# Patient Record
Sex: Male | Born: 1940 | Race: White | Hispanic: No | Marital: Married
Health system: Midwestern US, Community
[De-identification: ages and names within clinical notes are randomized; demographics above are authoritative.]

## PROBLEM LIST (undated history)

## (undated) DIAGNOSIS — Z8673 Personal history of transient ischemic attack (TIA), and cerebral infarction without residual deficits: Secondary | ICD-10-CM

## (undated) DIAGNOSIS — N183 Chronic kidney disease, stage 3 unspecified: Secondary | ICD-10-CM

## (undated) DIAGNOSIS — Z8739 Personal history of other diseases of the musculoskeletal system and connective tissue: Secondary | ICD-10-CM

## (undated) DIAGNOSIS — E039 Hypothyroidism, unspecified: Secondary | ICD-10-CM

## (undated) DIAGNOSIS — M25561 Pain in right knee: Secondary | ICD-10-CM

## (undated) DIAGNOSIS — Z955 Presence of coronary angioplasty implant and graft: Secondary | ICD-10-CM

## (undated) DIAGNOSIS — I10 Essential (primary) hypertension: Secondary | ICD-10-CM

## (undated) DIAGNOSIS — I2510 Atherosclerotic heart disease of native coronary artery without angina pectoris: Secondary | ICD-10-CM

## (undated) DIAGNOSIS — M25562 Pain in left knee: Secondary | ICD-10-CM

## (undated) DIAGNOSIS — E1169 Type 2 diabetes mellitus with other specified complication: Secondary | ICD-10-CM

## (undated) DIAGNOSIS — Z87442 Personal history of urinary calculi: Secondary | ICD-10-CM

## (undated) DIAGNOSIS — E119 Type 2 diabetes mellitus without complications: Secondary | ICD-10-CM

## (undated) DIAGNOSIS — E785 Hyperlipidemia, unspecified: Secondary | ICD-10-CM

## (undated) DIAGNOSIS — N400 Enlarged prostate without lower urinary tract symptoms: Secondary | ICD-10-CM

## (undated) DIAGNOSIS — H9190 Unspecified hearing loss, unspecified ear: Secondary | ICD-10-CM

## (undated) HISTORY — PX: URETEROLITHOTOMY: SHX71

## (undated) HISTORY — DX: Essential (primary) hypertension: I10

## (undated) HISTORY — DX: Type 2 diabetes mellitus with other specified complication: E11.69

## (undated) HISTORY — DX: Atherosclerotic heart disease of native coronary artery without angina pectoris: I25.10

## (undated) HISTORY — PX: EXTRACORPOREAL SHOCK WAVE LITHOTRIPSY: SHX1557

## (undated) HISTORY — DX: Hyperlipidemia, unspecified: E78.5

---

## 1989-10-18 HISTORY — PX: PERCUTANEOUS NEPHROSTOLITHOTOMY: SHX2207

## 2010-10-18 HISTORY — PX: CORONARY ANGIOPLASTY WITH STENT PLACEMENT: SHX49

## 2014-05-10 MED ORDER — ONDANSETRON (PF) 4 MG/2 ML INJECTION
4 mg/2 mL | INTRAMUSCULAR | Status: AC
Start: 2014-05-10 — End: 2014-05-10
  Administered 2014-05-11: via INTRAVENOUS

## 2014-05-10 MED FILL — SODIUM CHLORIDE 0.9 % IV: INTRAVENOUS | Qty: 1000

## 2014-05-10 MED FILL — ONDANSETRON (PF) 4 MG/2 ML INJECTION: 4 mg/2 mL | INTRAMUSCULAR | Qty: 2

## 2014-05-10 MED FILL — BD POSIFLUSH NORMAL SALINE 0.9 % INJECTION SYRINGE: INTRAMUSCULAR | Qty: 10

## 2014-05-10 NOTE — ED Notes (Signed)
Hourly rounding complete. Pt is resting in room with even and unlabored breathing. NAD noted. Bed in low position and call bell within reach. Safety/fall precautions in place. Pt updated on plan of care. Pt has no current needs at this time. Pt reports he is feeling much better after fluids and medication.

## 2014-05-10 NOTE — ED Notes (Signed)
ED provider has reviewed discharge instructions with the patient, spouse and daughter.  The patient, spouse and daughter verbalized understanding. Pt confirmed understanding of need for follow up with primary care provider and to return to emergency room for worsening symptoms.   Pt is not in any current distress and shows no evidence of clinical instability. Pt left ambulatory.  Pt family/friends are present. Pt left with all personal belongings.   Pt states chief complaint has improved with treatment provided  Pt is alert and oriented x 4, Pt is hemodynamically/respiratorily stable.    Paperwork given by provider and reviewed with patient, opportunity for questions/clarification given.

## 2014-05-10 NOTE — ED Notes (Signed)
Pt provided with urinal and asked to provide a sample when able

## 2014-05-10 NOTE — ED Notes (Signed)
Provider at bedside

## 2014-05-10 NOTE — ED Notes (Addendum)
Vomiting that started this am on way [place].  Denies CP or SOB.  Vomiting x 3, food color.  Pt had a headache yesterday.

## 2014-05-10 NOTE — ED Notes (Signed)
Pt and family report that pt has not been feeling well over the last 2 days. Pt vomited approx 3 times today and is nauseous upon arrival. He also reports he has had a headache over the past two weeks.

## 2014-05-10 NOTE — ED Provider Notes (Signed)
Patient is a 73 y.o. male presenting with vomiting. The history is provided by the patient and the spouse.   Vomiting   This is a new problem. The current episode started 6 to 12 hours ago. The problem occurs 2 to 4 times per day. The emesis has an appearance of stomach contents. There has been no fever. Associated symptoms include chills, a fever, abdominal pain, headaches and headaches. Pertinent negatives include no diarrhea and no cough.        Past Medical History   Diagnosis Date   ??? Diabetes (HCC)    ??? CAD (coronary artery disease)    ??? Kidney stones    ??? Hypertension    ??? Hypothyroid    ??? Pneumonia    ??? Gout         Past Surgical History   Procedure Laterality Date   ??? Hx coronary stent placement     ??? Hx hernia repair           No family history on file.     History     Social History   ??? Marital Status: UNKNOWN     Spouse Name: N/A     Number of Children: N/A   ??? Years of Education: N/A     Occupational History   ??? Not on file.     Social History Main Topics   ??? Smoking status: Never Smoker    ??? Smokeless tobacco: Not on file   ??? Alcohol Use: No   ??? Drug Use: No   ??? Sexual Activity: Not on file     Other Topics Concern   ??? Not on file     Social History Narrative   ??? No narrative on file                  ALLERGIES: Aspirin      Review of Systems   Constitutional: Positive for fever, chills and fatigue.   Respiratory: Negative for cough, chest tightness and shortness of breath.    Cardiovascular: Negative for chest pain, palpitations and leg swelling.   Gastrointestinal: Positive for nausea, vomiting and abdominal pain. Negative for diarrhea.   Musculoskeletal: Negative for back pain and neck pain.   Neurological: Positive for dizziness, weakness and headaches. Negative for syncope and light-headedness.   All other systems reviewed and are negative.      Filed Vitals:    05/10/14 1926   BP: 171/97   Pulse: 110   Temp: 99 ??F (37.2 ??C)   Resp: 20   Height: 6' (1.829 m)   Weight: 76.204 kg (168 lb)    SpO2: 95%            Physical Exam   Constitutional: He is oriented to person, place, and time. He appears well-developed and well-nourished. No distress.   HENT:   Head: Normocephalic and atraumatic.   Mouth/Throat: Mucous membranes are dry.   Eyes: Conjunctivae and EOM are normal. Pupils are equal, round, and reactive to light.   Neck: Normal range of motion.   Cardiovascular: Regular rhythm, normal heart sounds and intact distal pulses.  Tachycardia present.    No murmur heard.  Pulmonary/Chest: Effort normal and breath sounds normal. No stridor. No respiratory distress. He has no wheezes.   Abdominal: Soft. Bowel sounds are normal. He exhibits no distension. There is no tenderness. There is no rebound and no guarding.   Musculoskeletal: Normal range of motion. He exhibits no edema or tenderness.  Neurological: He is alert and oriented to person, place, and time. No cranial nerve deficit.   Skin: Skin is warm and dry. He is not diaphoretic.   Nursing note and vitals reviewed.       MDM  Number of Diagnoses or Management Options  Dehydration:   Diagnosis management comments: Headache with some vomiting, patient was not feeling well earlier in the week  Check labs, Head CT and re-eval.    1020pm - patient improved and ready for d/c home, advised to push po fluids       Amount and/or Complexity of Data Reviewed  Clinical lab tests: ordered and reviewed  Tests in the radiology section of CPT??: ordered and reviewed (CXR - neg  Head CT - no acute process)    Patient Progress  Patient progress: improved      Procedures    **Final Report**      ICD Codes / Adm.Diagnosis: 120 / Vomiting Head pressure, vomiting, fever  Examination: CT HEAD WO CON - [id number] - May 10 2014 7:55PM  Accession No: [id number]  Reason: headache      REPORT:  HEAD CT WITHOUT CONTRAST: 05/10/2014 7:55 PM    INDICATION: Headache, vomiting, fever, head pressure.    COMPARISON: None.    PROCEDURE: Axial images of the head were obtained without contrast.  Coronal   and sagittal reformats were performed.    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 The   ventricles and sulci are appropriate in size and configuration for age. No   loss of gray-white differentiation to suggest late acute or early subacute   infarction. No mass effect or intracranial hemorrhage.      IMPRESSION: No acute intracranial abnormality. 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          Signing/Reading Doctor: Aquilla Hacker [place] [phone])   Approved: Aquilla Hacker [place] [phone]) May 10 2014 8:12PM

## 2014-05-11 LAB — CBC WITH AUTOMATED DIFF
ABS. BASOPHILS: 0 10*3/uL (ref 0.0–0.1)
ABS. EOSINOPHILS: 0 10*3/uL (ref 0.0–0.4)
ABS. LYMPHOCYTES: 0.8 10*3/uL (ref 0.8–3.5)
ABS. MONOCYTES: 0.3 10*3/uL (ref 0.0–1.0)
ABS. NEUTROPHILS: 4.2 10*3/uL (ref 1.8–8.0)
BASOPHILS: 0 % (ref 0–1)
EOSINOPHILS: 0 % (ref 0–7)
HCT: 45.3 % (ref 36.6–50.3)
HGB: 15.4 g/dL (ref 12.1–17.0)
LYMPHOCYTES: 15 % (ref 12–49)
MCH: 32.7 PG (ref 26.0–34.0)
MCHC: 34 g/dL (ref 30.0–36.5)
MCV: 96.2 FL (ref 80.0–99.0)
MONOCYTES: 6 % (ref 5–13)
NEUTROPHILS: 79 % — ABNORMAL HIGH (ref 32–75)
PLATELET: 149 10*3/uL — ABNORMAL LOW (ref 150–400)
RBC: 4.71 M/uL (ref 4.10–5.70)
RDW: 15.1 % — ABNORMAL HIGH (ref 11.5–14.5)
WBC: 5.3 10*3/uL (ref 4.1–11.1)

## 2014-05-11 LAB — METABOLIC PANEL, COMPREHENSIVE
A-G Ratio: 1.2 (ref 1.1–2.2)
ALT (SGPT): 58 U/L (ref 12–78)
AST (SGOT): 66 U/L — ABNORMAL HIGH (ref 15–37)
Albumin: 3.7 g/dL (ref 3.5–5.0)
Alk. phosphatase: 105 U/L (ref 45–117)
Anion gap: 9 mmol/L (ref 5–15)
BUN/Creatinine ratio: 19 (ref 12–20)
BUN: 24 MG/DL — ABNORMAL HIGH (ref 6–20)
Bilirubin, total: 0.7 MG/DL (ref 0.2–1.0)
CO2: 27 mmol/L (ref 21–32)
Calcium: 8.5 MG/DL (ref 8.5–10.1)
Chloride: 100 mmol/L (ref 97–108)
Creatinine: 1.28 MG/DL — ABNORMAL HIGH (ref 0.45–1.15)
GFR est AA: >60 mL/min/{1.73_m2} (ref >60)
GFR est non-AA: 55 mL/min/{1.73_m2} — ABNORMAL LOW (ref >60)
Globulin: 3.2 g/dL (ref 2.0–4.0)
Glucose: 198 mg/dL — ABNORMAL HIGH (ref 65–100)
Potassium: 4.3 mmol/L (ref 3.5–5.1)
Protein, total: 6.9 g/dL (ref 6.4–8.2)
Sodium: 136 mmol/L (ref 136–145)

## 2014-05-11 LAB — URINALYSIS W/ REFLEX CULTURE
Bacteria: NEGATIVE /hpf
Bilirubin: NEGATIVE
Blood: NEGATIVE
Glucose: NEGATIVE mg/dL
Ketone: NEGATIVE mg/dL
Leukocyte Esterase: NEGATIVE
Nitrites: NEGATIVE
Specific gravity: 1.021 (ref 1.003–1.030)
Urobilinogen: 0.2 EU/dL (ref 0.2–1.0)
pH (UA): 5.5 (ref 5.0–8.0)

## 2014-05-11 MED ORDER — ONDANSETRON 4 MG TAB, RAPID DISSOLVE
4 mg | ORAL_TABLET | Freq: Three times a day (TID) | ORAL | Status: AC | PRN
Start: 2014-05-11 — End: ?

## 2014-05-11 MED ADMIN — sodium chloride 0.9 % bolus infusion 1,000 mL: INTRAVENOUS | NDC 00409798309

## 2015-03-04 HISTORY — PX: CORONARY ANGIOPLASTY WITH STENT PLACEMENT: SHX49

## 2015-04-09 DIAGNOSIS — I25708 Atherosclerosis of coronary artery bypass graft(s), unspecified, with other forms of angina pectoris: Secondary | ICD-10-CM | POA: Insufficient documentation

## 2015-04-09 DIAGNOSIS — E785 Hyperlipidemia, unspecified: Secondary | ICD-10-CM

## 2015-04-09 DIAGNOSIS — I10 Essential (primary) hypertension: Secondary | ICD-10-CM | POA: Insufficient documentation

## 2015-04-09 DIAGNOSIS — E1159 Type 2 diabetes mellitus with other circulatory complications: Secondary | ICD-10-CM | POA: Insufficient documentation

## 2015-04-09 DIAGNOSIS — I25810 Atherosclerosis of coronary artery bypass graft(s) without angina pectoris: Secondary | ICD-10-CM

## 2015-04-09 DIAGNOSIS — I152 Hypertension secondary to endocrine disorders: Secondary | ICD-10-CM

## 2015-04-09 HISTORY — DX: Atherosclerosis of coronary artery bypass graft(s) without angina pectoris: I25.810

## 2015-04-09 HISTORY — DX: Atherosclerosis of coronary artery bypass graft(s), unspecified, with other forms of angina pectoris: I25.708

## 2015-04-09 HISTORY — DX: Type 2 diabetes mellitus with other circulatory complications: E11.59

## 2015-04-09 HISTORY — DX: Hyperlipidemia, unspecified: E78.5

## 2015-04-09 HISTORY — DX: Hypertension secondary to endocrine disorders: I15.2

## 2015-04-09 HISTORY — DX: Essential (primary) hypertension: I10

## 2015-07-06 DIAGNOSIS — N1832 Chronic kidney disease, stage 3b: Secondary | ICD-10-CM | POA: Insufficient documentation

## 2015-07-06 DIAGNOSIS — N183 Chronic kidney disease, stage 3 unspecified: Secondary | ICD-10-CM

## 2015-07-06 HISTORY — DX: Chronic kidney disease, stage 3b: N18.32

## 2015-07-06 HISTORY — DX: Chronic kidney disease, stage 3 unspecified: N18.30

## 2015-11-10 ENCOUNTER — Other Ambulatory Visit: Payer: Self-pay

## 2015-11-10 ENCOUNTER — Encounter: Payer: Self-pay | Admitting: Surgery

## 2015-11-10 DIAGNOSIS — R0989 Other specified symptoms and signs involving the circulatory and respiratory systems: Secondary | ICD-10-CM

## 2015-11-25 ENCOUNTER — Encounter: Payer: Self-pay | Admitting: Surgery

## 2015-12-01 ENCOUNTER — Ambulatory Visit (INDEPENDENT_AMBULATORY_CARE_PROVIDER_SITE_OTHER): Payer: Medicare HMO | Admitting: Surgery

## 2015-12-01 ENCOUNTER — Encounter: Payer: Self-pay | Admitting: Surgery

## 2015-12-01 ENCOUNTER — Ambulatory Visit (HOSPITAL_COMMUNITY)
Admission: RE | Admit: 2015-12-01 | Discharge: 2015-12-01 | Disposition: A | Discharge status: Home / Self Care | Payer: Medicare HMO | Source: Ambulatory Visit | Attending: Surgery | Admitting: Surgery

## 2015-12-01 VITALS — BP 138/84 | HR 75 | Temp 97.0°F | Resp 16 | Ht 71.0 in | Wt 190.0 lb

## 2015-12-01 DIAGNOSIS — M25562 Pain in left knee: Secondary | ICD-10-CM

## 2015-12-01 DIAGNOSIS — I70203 Unspecified atherosclerosis of native arteries of extremities, bilateral legs: Secondary | ICD-10-CM | POA: Diagnosis not present

## 2015-12-01 DIAGNOSIS — M25561 Pain in right knee: Secondary | ICD-10-CM

## 2015-12-01 DIAGNOSIS — R0989 Other specified symptoms and signs involving the circulatory and respiratory systems: Secondary | ICD-10-CM | POA: Diagnosis not present

## 2015-12-01 NOTE — Progress Notes (Signed)
Patient name: David Baxter MRN: [id number] DOB: 08-06-1941 Sex: male   Referred by: Dr. Marina Goodell  Reason for referral:  Chief Complaint  Patient presents with  . New Evaluation    Ref. by Dr. Brigitte Pulse  C/O Left Femoral Bruit      HISTORY OF PRESENT ILLNESS:  this is a 75 year old gentleman who is referred today for evaluation of lower extremity vascular disease.  The patient reports that his feet are ice cold as is the rest of his body.  A bruit was heard in the left groin.  The patient denies any claudication type symptoms.  He does not have nonhealing ulcers or slow to heal wounds on his feet.   The patient suffers from diabetes.  He states his blood sugars are usually around 105. He has coronary artery disease and has undergone stenting in the past.  He is currently taking Effient.  His hypercholesterolemia is managed with statin. He is a nonsmoker  Past Medical History  Diagnosis Date  . Hypertension   . CAD (coronary artery disease)   . Diabetes mellitus without complication (HCC)     Type II  . Hyperlipidemia   . Chronic kidney disease     Stage III  . Peripheral vascular disease (HCC)   . Thyroid disease     Hypo-Thyriudism Chronic  . Arthritis     Gout    Past Surgical History  Procedure Laterality Date  . Stents  2012 and  2016    Social History   Social History  . Marital Status: Married    Spouse Name: N/A  . Number of Children: N/A  . Years of Education: N/A   Occupational History  . Not on file.   Social History Main Topics  . Smoking status: Never Smoker   . Smokeless tobacco: Never Used  . Alcohol Use: No  . Drug Use: No  . Sexual Activity: Not on file   Other Topics Concern  . Not on file   Social History Narrative    Family History  Problem Relation Age of Onset  . Heart disease Mother 8    After age 64 - CHF  . Hypertension Father   . Heart disease Father 88    After age 92 - CHF    Allergies as of 12/01/2015  . (Not on  File)    Current Outpatient Prescriptions on File Prior to Visit  Medication Sig Dispense Refill  . allopurinol (ZYLOPRIM) 100 MG tablet Take 100 mg by mouth daily.    Marland Kitchen aspirin 81 MG tablet Take 81 mg by mouth daily.    Marland Kitchen glucose blood (ACCU-CHEK AVIVA PLUS) test strip 1 each by Other route as needed for other. Use as instructed    . Lancets MISC by Does not apply route.    Marland Kitchen levothyroxine (SYNTHROID, LEVOTHROID) 75 MCG tablet Take 100 mcg by mouth daily before breakfast.     . meclizine (ANTIVERT) 25 MG tablet Take 25 mg by mouth as needed for dizziness.     . metformin (FORTAMET) 500 MG (OSM) 24 hr tablet Take 500 mg by mouth 2 (two) times daily with a meal.    . metoprolol tartrate (LOPRESSOR) 25 mg/10 mL SUSP Take by mouth 2 (two) times daily.    . nitroGLYCERIN (NITROSTAT) 0.4 MG SL tablet Place 0.4 mg under the tongue every 5 (five) minutes as needed for chest pain.    . prasugrel (EFFIENT) 10 MG TABS tablet Take 10  mg by mouth daily.    . simvastatin (ZOCOR) 40 MG tablet Take 20 mg by mouth daily.      No current facility-administered medications on file prior to visit.     REVIEW OF SYSTEMS: Cardiovascular: No chest pain, chest pressure, palpitations, orthopnea, or dyspnea on exertion. No claudication or rest pain,  No history of DVT or phlebitis. Pulmonary: No productive cough, asthma or wheezing. Neurologic: No weakness, paresthesias, aphasia, or amaurosis.  Positive for dizziness. Hematologic: No bleeding problems or clotting disorders. Musculoskeletal: No joint pain or joint swelling. Gastrointestinal: No blood in stool or hematemesis Genitourinary: No dysuria or hematuria. Psychiatric:: No history of major depression. Integumentary: No rashes or ulcers. Constitutional: No fever or chills.  PHYSICAL EXAMINATION:  Filed Vitals:   12/01/15 1024  BP: 138/84  Pulse: 75  Temp: 97 F (36.1 C)  TempSrc: Oral  Resp: 16  Height:  (1.803 m)  Weight: 190 lb (86.183  kg)  SpO2: 99%   Body mass index is 26.51 kg/(m^2). General: The patient appears their stated age.   HEENT:  No gross abnormalities Pulmonary: Respirations are non-labored Abdomen: Soft and non-tender .  No pulsatile mass. Musculoskeletal: There are no major deformities.   Neurologic: No focal weakness or paresthesias are detected, Skin: There are no ulcer or rashes noted. Psychiatric: The patient has normal affect. Cardiovascular: There is a regular rate and rhythm without significant murmur appreciated. No carotid bruits.  Palpable pedal pulses bilaterally.  Palpable femoral pulses.  I do not auscultate a bruit or thrill in either groin  Diagnostic Studies:  I have reviewed his duplex ultrasound today. Ankle brachial index is normal bilaterally with triphasic waveforms   Assessment:   cool feet Plan:  after reviewing the patient's ultrasound today, he has normal blood flow to his feet.  This is also corroborated with palpable pedal pulses bilaterally.  There was no evidence of pseudoaneurysm or AV fistula on ultrasound.   Based on imaging results today, I do not feel that the patient's symptoms are vascular in etiology.     Jorge Ny, M.D. [facility] Office: [phone] Pager:  [phone]

## 2016-10-01 DIAGNOSIS — I2510 Atherosclerotic heart disease of native coronary artery without angina pectoris: Secondary | ICD-10-CM | POA: Diagnosis not present

## 2016-10-01 DIAGNOSIS — D649 Anemia, unspecified: Secondary | ICD-10-CM

## 2016-10-01 DIAGNOSIS — E538 Deficiency of other specified B group vitamins: Secondary | ICD-10-CM | POA: Diagnosis not present

## 2016-10-01 DIAGNOSIS — R531 Weakness: Secondary | ICD-10-CM | POA: Diagnosis not present

## 2016-10-01 DIAGNOSIS — E119 Type 2 diabetes mellitus without complications: Secondary | ICD-10-CM | POA: Diagnosis not present

## 2016-11-01 DIAGNOSIS — M1040 Other secondary gout, unspecified site: Secondary | ICD-10-CM | POA: Diagnosis not present

## 2016-11-01 DIAGNOSIS — E785 Hyperlipidemia, unspecified: Secondary | ICD-10-CM | POA: Diagnosis not present

## 2016-11-01 DIAGNOSIS — E1129 Type 2 diabetes mellitus with other diabetic kidney complication: Secondary | ICD-10-CM | POA: Diagnosis not present

## 2016-11-01 DIAGNOSIS — E038 Other specified hypothyroidism: Secondary | ICD-10-CM | POA: Diagnosis not present

## 2016-11-01 DIAGNOSIS — F413 Other mixed anxiety disorders: Secondary | ICD-10-CM | POA: Diagnosis not present

## 2016-11-01 DIAGNOSIS — I25709 Atherosclerosis of coronary artery bypass graft(s), unspecified, with unspecified angina pectoris: Secondary | ICD-10-CM | POA: Diagnosis not present

## 2016-11-01 DIAGNOSIS — E1122 Type 2 diabetes mellitus with diabetic chronic kidney disease: Secondary | ICD-10-CM | POA: Diagnosis not present

## 2016-11-01 DIAGNOSIS — E782 Mixed hyperlipidemia: Secondary | ICD-10-CM | POA: Diagnosis not present

## 2016-11-01 DIAGNOSIS — H906 Mixed conductive and sensorineural hearing loss, bilateral: Secondary | ICD-10-CM | POA: Diagnosis not present

## 2016-11-01 DIAGNOSIS — I6350 Cerebral infarction due to unspecified occlusion or stenosis of unspecified cerebral artery: Secondary | ICD-10-CM | POA: Diagnosis not present

## 2016-11-01 DIAGNOSIS — I2510 Atherosclerotic heart disease of native coronary artery without angina pectoris: Secondary | ICD-10-CM | POA: Diagnosis not present

## 2016-11-01 DIAGNOSIS — I10 Essential (primary) hypertension: Secondary | ICD-10-CM | POA: Diagnosis not present

## 2016-11-16 DIAGNOSIS — E875 Hyperkalemia: Secondary | ICD-10-CM | POA: Diagnosis not present

## 2016-11-24 DIAGNOSIS — N184 Chronic kidney disease, stage 4 (severe): Secondary | ICD-10-CM | POA: Diagnosis not present

## 2016-12-02 DIAGNOSIS — E875 Hyperkalemia: Secondary | ICD-10-CM | POA: Diagnosis not present

## 2016-12-15 DIAGNOSIS — E876 Hypokalemia: Secondary | ICD-10-CM | POA: Diagnosis not present

## 2016-12-23 DIAGNOSIS — E875 Hyperkalemia: Secondary | ICD-10-CM | POA: Diagnosis not present

## 2016-12-27 DIAGNOSIS — E875 Hyperkalemia: Secondary | ICD-10-CM | POA: Diagnosis not present

## 2017-01-06 DIAGNOSIS — R339 Retention of urine, unspecified: Secondary | ICD-10-CM | POA: Diagnosis not present

## 2017-01-06 DIAGNOSIS — N401 Enlarged prostate with lower urinary tract symptoms: Secondary | ICD-10-CM | POA: Diagnosis not present

## 2017-01-10 ENCOUNTER — Other Ambulatory Visit: Payer: Self-pay | Admitting: Urology

## 2017-01-13 ENCOUNTER — Other Ambulatory Visit: Payer: Self-pay | Admitting: Urology

## 2017-02-07 ENCOUNTER — Encounter (HOSPITAL_BASED_OUTPATIENT_CLINIC_OR_DEPARTMENT_OTHER): Payer: Self-pay | Admitting: *Deleted

## 2017-02-07 NOTE — Progress Notes (Signed)
SPOKE W/ PT'S WIFE.  PT HOH.  NPO AFTER MN.  ARRIVE AT 1000.  NEEDS ISTAT.  CURRENT EKG, LOV NOTE, CARDIAC CATH REPORT, ECHO, AND MYOVIEW TO BE FAXED FROM DR Dulce Sellar ([facility] IN [place]).  WILL TAKE SYNTHROID AND METOPROLOL AM DOS W/ SIPS OF WATER.

## 2017-02-09 ENCOUNTER — Encounter (HOSPITAL_BASED_OUTPATIENT_CLINIC_OR_DEPARTMENT_OTHER): Payer: Self-pay | Admitting: *Deleted

## 2017-02-14 ENCOUNTER — Ambulatory Visit (HOSPITAL_BASED_OUTPATIENT_CLINIC_OR_DEPARTMENT_OTHER)
Admission: AD | Admit: 2017-02-14 | Discharge: 2017-02-14 | Disposition: A | Discharge status: Home / Self Care | Payer: Medicare PPO | Source: Ambulatory Visit | Attending: Urology | Admitting: Urology

## 2017-02-14 ENCOUNTER — Encounter (HOSPITAL_BASED_OUTPATIENT_CLINIC_OR_DEPARTMENT_OTHER): Payer: Self-pay | Admitting: Anesthesiology

## 2017-02-14 ENCOUNTER — Encounter (HOSPITAL_BASED_OUTPATIENT_CLINIC_OR_DEPARTMENT_OTHER)
Admission: AD | Disposition: A | Discharge status: Home / Self Care | Payer: Self-pay | Source: Ambulatory Visit | Attending: Urology

## 2017-02-14 ENCOUNTER — Ambulatory Visit (HOSPITAL_BASED_OUTPATIENT_CLINIC_OR_DEPARTMENT_OTHER): Payer: Medicare PPO | Admitting: Anesthesiology

## 2017-02-14 DIAGNOSIS — I2510 Atherosclerotic heart disease of native coronary artery without angina pectoris: Secondary | ICD-10-CM | POA: Diagnosis not present

## 2017-02-14 DIAGNOSIS — I129 Hypertensive chronic kidney disease with stage 1 through stage 4 chronic kidney disease, or unspecified chronic kidney disease: Secondary | ICD-10-CM | POA: Diagnosis not present

## 2017-02-14 DIAGNOSIS — E785 Hyperlipidemia, unspecified: Secondary | ICD-10-CM | POA: Diagnosis not present

## 2017-02-14 DIAGNOSIS — E1122 Type 2 diabetes mellitus with diabetic chronic kidney disease: Secondary | ICD-10-CM | POA: Diagnosis not present

## 2017-02-14 DIAGNOSIS — Z8673 Personal history of transient ischemic attack (TIA), and cerebral infarction without residual deficits: Secondary | ICD-10-CM | POA: Diagnosis not present

## 2017-02-14 DIAGNOSIS — N400 Enlarged prostate without lower urinary tract symptoms: Secondary | ICD-10-CM | POA: Diagnosis not present

## 2017-02-14 DIAGNOSIS — Z7982 Long term (current) use of aspirin: Secondary | ICD-10-CM | POA: Diagnosis not present

## 2017-02-14 DIAGNOSIS — E039 Hypothyroidism, unspecified: Secondary | ICD-10-CM | POA: Diagnosis not present

## 2017-02-14 DIAGNOSIS — Z7984 Long term (current) use of oral hypoglycemic drugs: Secondary | ICD-10-CM | POA: Insufficient documentation

## 2017-02-14 DIAGNOSIS — Z79899 Other long term (current) drug therapy: Secondary | ICD-10-CM | POA: Insufficient documentation

## 2017-02-14 DIAGNOSIS — Z955 Presence of coronary angioplasty implant and graft: Secondary | ICD-10-CM | POA: Diagnosis not present

## 2017-02-14 DIAGNOSIS — R338 Other retention of urine: Secondary | ICD-10-CM | POA: Insufficient documentation

## 2017-02-14 DIAGNOSIS — R350 Frequency of micturition: Secondary | ICD-10-CM | POA: Diagnosis not present

## 2017-02-14 DIAGNOSIS — N401 Enlarged prostate with lower urinary tract symptoms: Secondary | ICD-10-CM | POA: Diagnosis not present

## 2017-02-14 DIAGNOSIS — N320 Bladder-neck obstruction: Secondary | ICD-10-CM | POA: Diagnosis not present

## 2017-02-14 DIAGNOSIS — N183 Chronic kidney disease, stage 3 (moderate): Secondary | ICD-10-CM | POA: Diagnosis not present

## 2017-02-14 DIAGNOSIS — N138 Other obstructive and reflux uropathy: Secondary | ICD-10-CM | POA: Diagnosis not present

## 2017-02-14 DIAGNOSIS — R339 Retention of urine, unspecified: Secondary | ICD-10-CM | POA: Diagnosis present

## 2017-02-14 HISTORY — DX: Presence of coronary angioplasty implant and graft: Z95.5

## 2017-02-14 HISTORY — DX: Benign prostatic hyperplasia without lower urinary tract symptoms: N40.0

## 2017-02-14 HISTORY — DX: Pain in right knee: M25.561

## 2017-02-14 HISTORY — DX: Pain in right knee: M25.562

## 2017-02-14 HISTORY — DX: Hypothyroidism, unspecified: E03.9

## 2017-02-14 HISTORY — DX: Personal history of other diseases of the musculoskeletal system and connective tissue: Z87.39

## 2017-02-14 HISTORY — DX: Type 2 diabetes mellitus without complications: E11.9

## 2017-02-14 HISTORY — DX: Chronic kidney disease, stage 3 (moderate): N18.3

## 2017-02-14 HISTORY — DX: Personal history of urinary calculi: Z87.442

## 2017-02-14 HISTORY — DX: Personal history of transient ischemic attack (TIA), and cerebral infarction without residual deficits: Z86.73

## 2017-02-14 HISTORY — DX: Unspecified hearing loss, unspecified ear: H91.90

## 2017-02-14 HISTORY — DX: Chronic kidney disease, stage 3 unspecified: N18.30

## 2017-02-14 HISTORY — PX: CYSTOSCOPY WITH INSERTION OF UROLIFT: SHX6678

## 2017-02-14 LAB — GLUCOSE, CAPILLARY: GLUCOSE-CAPILLARY: 109 mg/dL — AB (ref 65–99)

## 2017-02-14 LAB — POCT I-STAT, CHEM 8
BUN: 31 mg/dL — AB (ref 6–20)
CHLORIDE: 107 mmol/L (ref 101–111)
Calcium, Ion: 1.26 mmol/L (ref 1.15–1.40)
Creatinine, Ser: 1.6 mg/dL — ABNORMAL HIGH (ref 0.61–1.24)
Glucose, Bld: 138 mg/dL — ABNORMAL HIGH (ref 65–99)
HEMATOCRIT: 48 % (ref 39.0–52.0)
Hemoglobin: 16.3 g/dL (ref 13.0–17.0)
POTASSIUM: 5.2 mmol/L — AB (ref 3.5–5.1)
SODIUM: 142 mmol/L (ref 135–145)
TCO2: 27 mmol/L (ref 0–100)

## 2017-02-14 SURGERY — CYSTOSCOPY WITH INSERTION OF UROLIFT
Anesthesia: General | Site: Bladder

## 2017-02-14 MED ORDER — OXYCODONE HCL 5 MG PO TABS
5.0000 mg | ORAL_TABLET | Freq: Once | ORAL | Status: DC | PRN
  Filled 2017-02-14: qty 1

## 2017-02-14 MED ORDER — PROPOFOL 10 MG/ML IV BOLUS
INTRAVENOUS | Status: DC | PRN
  Administered 2017-02-14: 120 mg via INTRAVENOUS

## 2017-02-14 MED ORDER — SODIUM CHLORIDE 0.9 % IV SOLN
INTRAVENOUS | Status: DC
  Administered 2017-02-14: 11:00:00 via INTRAVENOUS
  Filled 2017-02-14: qty 1000

## 2017-02-14 MED ORDER — LIDOCAINE 2% (20 MG/ML) 5 ML SYRINGE
INTRAMUSCULAR | Status: AC
  Filled 2017-02-14: qty 5

## 2017-02-14 MED ORDER — DEXAMETHASONE SODIUM PHOSPHATE 10 MG/ML IJ SOLN
INTRAMUSCULAR | Status: AC
  Filled 2017-02-14: qty 1

## 2017-02-14 MED ORDER — CEFAZOLIN SODIUM-DEXTROSE 2-4 GM/100ML-% IV SOLN
INTRAVENOUS | Status: AC
  Filled 2017-02-14: qty 100

## 2017-02-14 MED ORDER — STERILE WATER FOR IRRIGATION IR SOLN
Status: DC | PRN
  Administered 2017-02-14: 3000 mL via INTRAVESICAL

## 2017-02-14 MED ORDER — FENTANYL CITRATE (PF) 100 MCG/2ML IJ SOLN
25.0000 ug | INTRAMUSCULAR | Status: DC | PRN
  Filled 2017-02-14: qty 1

## 2017-02-14 MED ORDER — ONDANSETRON HCL 4 MG/2ML IJ SOLN
INTRAMUSCULAR | Status: DC | PRN
  Administered 2017-02-14: 4 mg via INTRAVENOUS

## 2017-02-14 MED ORDER — CEFAZOLIN SODIUM-DEXTROSE 2-4 GM/100ML-% IV SOLN
2.0000 g | INTRAVENOUS | Status: AC
  Administered 2017-02-14: 2 g via INTRAVENOUS
  Filled 2017-02-14: qty 100

## 2017-02-14 MED ORDER — FENTANYL CITRATE (PF) 100 MCG/2ML IJ SOLN
INTRAMUSCULAR | Status: DC | PRN
  Administered 2017-02-14: 50 ug via INTRAVENOUS

## 2017-02-14 MED ORDER — OXYCODONE HCL 5 MG/5ML PO SOLN
5.0000 mg | Freq: Once | ORAL | Status: DC | PRN
  Filled 2017-02-14: qty 5

## 2017-02-14 MED ORDER — PROPOFOL 10 MG/ML IV BOLUS
INTRAVENOUS | Status: AC
  Filled 2017-02-14: qty 20

## 2017-02-14 MED ORDER — CEFAZOLIN SODIUM-DEXTROSE 1-4 GM/50ML-% IV SOLN
1.0000 g | INTRAVENOUS | Status: DC
  Filled 2017-02-14: qty 50

## 2017-02-14 MED ORDER — MEPERIDINE HCL 25 MG/ML IJ SOLN
6.2500 mg | INTRAMUSCULAR | Status: DC | PRN
  Filled 2017-02-14: qty 1

## 2017-02-14 MED ORDER — LIDOCAINE 2% (20 MG/ML) 5 ML SYRINGE
INTRAMUSCULAR | Status: DC | PRN
  Administered 2017-02-14: 60 mg via INTRAVENOUS

## 2017-02-14 MED ORDER — TRAMADOL HCL 50 MG PO TABS: 50.0000 mg | ORAL_TABLET | Freq: Four times a day (QID) | ORAL | 0 refills | Status: DC | PRN

## 2017-02-14 MED ORDER — PHENYLEPHRINE 40 MCG/ML (10ML) SYRINGE FOR IV PUSH (FOR BLOOD PRESSURE SUPPORT)
PREFILLED_SYRINGE | INTRAVENOUS | Status: AC
  Filled 2017-02-14: qty 10

## 2017-02-14 MED ORDER — DEXAMETHASONE SODIUM PHOSPHATE 4 MG/ML IJ SOLN
INTRAMUSCULAR | Status: DC | PRN
  Administered 2017-02-14: 10 mg via INTRAVENOUS

## 2017-02-14 MED ORDER — PHENYLEPHRINE 40 MCG/ML (10ML) SYRINGE FOR IV PUSH (FOR BLOOD PRESSURE SUPPORT)
PREFILLED_SYRINGE | INTRAVENOUS | Status: DC | PRN
  Administered 2017-02-14: 160 ug via INTRAVENOUS
  Administered 2017-02-14 (×3): 80 ug via INTRAVENOUS
  Administered 2017-02-14: 200 ug via INTRAVENOUS
  Administered 2017-02-14: 80 ug via INTRAVENOUS

## 2017-02-14 MED ORDER — ACETAMINOPHEN 325 MG PO TABS
325.0000 mg | ORAL_TABLET | ORAL | Status: DC | PRN
  Filled 2017-02-14: qty 2

## 2017-02-14 MED ORDER — ONDANSETRON HCL 4 MG/2ML IJ SOLN
INTRAMUSCULAR | Status: AC
  Filled 2017-02-14: qty 2

## 2017-02-14 MED ORDER — ONDANSETRON HCL 4 MG/2ML IJ SOLN
4.0000 mg | Freq: Once | INTRAMUSCULAR | Status: DC | PRN
  Filled 2017-02-14: qty 2

## 2017-02-14 MED ORDER — FENTANYL CITRATE (PF) 100 MCG/2ML IJ SOLN
INTRAMUSCULAR | Status: AC
  Filled 2017-02-14: qty 2

## 2017-02-14 MED ORDER — ACETAMINOPHEN 160 MG/5ML PO SOLN
325.0000 mg | ORAL | Status: DC | PRN
  Filled 2017-02-14: qty 20.3

## 2017-02-14 SURGICAL SUPPLY — 16 items
BAG DRAIN URO-CYSTO SKYTR STRL (DRAIN) ×3 IMPLANT
BAG URINE DRAINAGE (UROLOGICAL SUPPLIES) ×3 IMPLANT
CATH FOLEY 2WAY SLVR  5CC 16FR (CATHETERS) ×2
CATH FOLEY 2WAY SLVR 5CC 16FR (CATHETERS) ×1 IMPLANT
CLOTH BEACON ORANGE TIMEOUT ST (SAFETY) ×3 IMPLANT
GLOVE BIO SURGEON STRL SZ8 (GLOVE) ×3 IMPLANT
GOWN STRL REUS W/TWL LRG LVL3 (GOWN DISPOSABLE) ×3 IMPLANT
GOWN STRL REUS W/TWL XL LVL3 (GOWN DISPOSABLE) ×3 IMPLANT
HOLDER FOLEY CATH W/STRAP (MISCELLANEOUS) ×3 IMPLANT
IV NS IRRIG 3000ML ARTHROMATIC (IV SOLUTION) IMPLANT
MANIFOLD NEPTUNE II (INSTRUMENTS) ×3 IMPLANT
PACK CYSTO (CUSTOM PROCEDURE TRAY) ×3 IMPLANT
SYSTEM UROLIFT (Male Continence) ×15 IMPLANT
TUBE CONNECTING 12'X1/4 (SUCTIONS) ×1
TUBE CONNECTING 12X1/4 (SUCTIONS) ×2 IMPLANT
WATER STERILE IRR 3000ML UROMA (IV SOLUTION) ×3 IMPLANT

## 2017-02-14 NOTE — Anesthesia Postprocedure Evaluation (Signed)
Anesthesia Post Note  Patient: David Baxter  Procedure(s) Performed: Procedure(s) (LRB): CYSTOSCOPY WITH INSERTION OF UROLIFT (N/A)  Patient location during evaluation: PACU Anesthesia Type: General Level of consciousness: awake and alert and oriented Pain management: pain level controlled Vital Signs Assessment: post-procedure vital signs reviewed and stable Respiratory status: nonlabored ventilation, respiratory function stable and spontaneous breathing Cardiovascular status: blood pressure returned to baseline and stable Postop Assessment: no signs of nausea or vomiting Anesthetic complications: no       Last Vitals:  Vitals:   02/14/17 1315 02/14/17 1330  BP: (!) 133/56 (!) 132/57  Pulse: 65 66  Resp: 16 15  Temp:      Last Pain:  Vitals:   02/14/17 1011  TempSrc: Oral                 Lakeita Panther A.

## 2017-02-14 NOTE — Discharge Instructions (Signed)
°  Post Anesthesia Home Care Instructions  Activity: Get plenty of rest for the remainder of the day. A responsible individual must stay with you for 24 hours following the procedure.  For the next 24 hours, DO NOT: -Drive a car -Advertising copywriter -Drink alcoholic beverages -Take any medication unless instructed by your physician -Make any legal decisions or sign important papers.  Meals: Start with liquid foods such as gelatin or soup. Progress to regular foods as tolerated. Avoid greasy, spicy, heavy foods. If nausea and/or vomiting occur, drink only clear liquids until the nausea and/or vomiting subsides. Call your physician if vomiting continues.  Special Instructions/Symptoms: Your throat may feel dry or sore from the anesthesia or the breathing tube placed in your throat during surgery. If this causes discomfort, gargle with warm salt water. The discomfort should disappear within 24 hours.  If you had a scopolamine patch placed behind your ear for the management of post- operative nausea and/or vomiting:  1. The medication in the patch is effective for 72 hours, after which it should be removed.  Wrap patch in a tissue and discard in the trash. Wash hands thoroughly with soap and water. 2. You may remove the patch earlier than 72 hours if you experience unpleasant side effects which may include dry mouth, dizziness or visual disturbances. 3. Avoid touching the patch. Wash your hands with soap and water after contact with the patch.  Call your surgeon if you experience:   1.  Fever over 101.0. 2.  Inability to urinate after catheter removal 3.  Nausea and/or vomiting. 4.  Extreme swelling or bruising at the surgical site. 5.  Continued bleeding from the incision. 6.  Increased pain, redness or drainage from the incision. 7.  Problems related to your pain medication. 8.  Any problems and/or concerns

## 2017-02-14 NOTE — Transfer of Care (Signed)
Last Vitals:  Vitals:   02/14/17 1011  BP: (!) 149/77  Pulse: 65  Resp: 16  Temp: 36.6 C    Last Pain:  Vitals:   02/14/17 1011  TempSrc: Oral      Patients Stated Pain Goal: 7 (02/14/17 1056)  Immediate Anesthesia Transfer of Care Note  Patient: David Baxter  Procedure(s) Performed: Procedure(s) (LRB): CYSTOSCOPY WITH INSERTION OF UROLIFT (N/A)  Patient Location: PACU  Anesthesia Type: General  Level of Consciousness: awake, alert  and oriented  Airway & Oxygen Therapy: Patient Spontanous Breathing and Patient connected to nasal cannula oxygen  Post-op Assessment: Report given to PACU RN and Post -op Vital signs reviewed and stable  Post vital signs: Reviewed and stable  Complications: No apparent anesthesia complications

## 2017-02-14 NOTE — H&P (Signed)
Urology Admission H&P  Chief Complaint: urinary retention  History of Present Illness: David Baxter is a 75yo with incomplete emptyuing and BPH  Past Medical History:  Diagnosis Date  . Arthralgia of both lower legs   . BPH (benign prostatic hyperplasia)   . CAD (coronary artery disease)    cardiologist--  dr Dulce Sellar w/ [facility] in [place]  . CKD (chronic kidney disease), stage III   . History of gout   . History of kidney stones   . History of stroke    per pt/ wife -- unknown when-- told this was on MRI in 2017  . HOH (hard of hearing)   . Hyperlipidemia   . Hypertension   . Hypothyroidism   . S/P drug eluting coronary stent placement    2012-- x1DES to OM:  05/ 2016  x2 to CFX  . Type 2 diabetes mellitus (HCC)    Past Surgical History:  Procedure Laterality Date  . CORONARY ANGIOPLASTY WITH STENT PLACEMENT  03/04/2015   x2 DES to CFX  . CORONARY ANGIOPLASTY WITH STENT PLACEMENT  2012   x1 DES to OM  . EXTRACORPOREAL SHOCK WAVE LITHOTRIPSY  1990's  . PERCUTANEOUS NEPHROSTOLITHOTOMY  1991   and abdominal hernia repair  . URETEROLITHOTOMY  1991 and 1994    Home Medications:  Prescriptions Prior to Admission  Medication Sig Dispense Refill Last Dose  . aspirin EC 81 MG tablet Take 81 mg by mouth daily.   02/13/2017 at Unknown time  . diphenhydramine-acetaminophen (TYLENOL PM) 25-500 MG TABS tablet Take 1 tablet by mouth at bedtime as needed.   02/13/2017 at Unknown time  . finasteride (PROSCAR) 5 MG tablet Take 5 mg by mouth every evening.   02/13/2017 at Unknown time  . levothyroxine (SYNTHROID, LEVOTHROID) 137 MCG tablet Take 137 mcg by mouth daily before breakfast.   02/14/2017 at 0815  . meclizine (ANTIVERT) 25 MG tablet Take 25 mg by mouth 3 (three) times daily as needed for dizziness.   02/13/2017 at Unknown time  . metformin (FORTAMET) 500 MG (OSM) 24 hr tablet Take 500 mg by mouth 2 (two) times daily with a meal.   02/13/2017 at Unknown time  . metoprolol  succinate (TOPROL-XL) 25 MG 24 hr tablet Take 25 mg by mouth every morning.   02/14/2017 at 0815  . silodosin (RAPAFLO) 8 MG CAPS capsule Take 8 mg by mouth every evening.   02/13/2017 at Unknown time  . simvastatin (ZOCOR) 40 MG tablet Take 20 mg by mouth every morning.    02/13/2017 at Unknown time  . vitamin B-12 (CYANOCOBALAMIN) 1000 MCG tablet Take 1,000 mcg by mouth daily.   02/13/2017 at Unknown time   Allergies: No Known Allergies  Family History  Problem Relation Age of Onset  . Heart disease Mother 52    After age 35 - CHF  . Hypertension Father   . Heart disease Father 41    After age 78 - CHF   Social History:  reports that he has never smoked. He has never used smokeless tobacco. He reports that he does not drink alcohol or use drugs.  Review of Systems  Genitourinary: Positive for frequency and urgency.  All other systems reviewed and are negative.   Physical Exam:  Vital signs in last 24 hours: Temp:  [97.8 F (36.6 C)] 97.8 F (36.6 C) (04/30 1011) Pulse Rate:  [65] 65 (04/30 1011) Resp:  [16] 16 (04/30 1011) BP: (149)/(77) 149/77 (04/30 1011) SpO2:  [99 %]  99 % (04/30 1011) Weight:  [81.6 kg (180 lb)] 81.6 kg (180 lb) (04/30 1011) Physical Exam  Constitutional: He is oriented to person, place, and time. He appears well-developed and well-nourished.  HENT:  Head: Normocephalic and atraumatic.  Eyes: EOM are normal. Pupils are equal, round, and reactive to light.  Neck: Normal range of motion. No thyromegaly present.  Cardiovascular: Normal rate and regular rhythm.   Respiratory: Effort normal. No respiratory distress.  GI: Soft. He exhibits no distension.  Musculoskeletal: Normal range of motion. He exhibits no edema.  Neurological: He is alert and oriented to person, place, and time.  Skin: Skin is warm and dry.  Psychiatric: He has a normal mood and affect. His behavior is normal. Judgment and thought content normal.    Laboratory Data:  Results for  orders placed or performed during the hospital encounter of 02/14/17 (from the past 24 hour(s))  I-STAT, chem 8     Status: Abnormal   Collection Time: 02/14/17 10:52 AM  Result Value Ref Range   Sodium 142 135 - 145 mmol/L   Potassium 5.2 (H) 3.5 - 5.1 mmol/L   Chloride 107 101 - 111 mmol/L   BUN 31 (H) 6 - 20 mg/dL   Creatinine, Ser 1.61 (H) 0.61 - 1.24 mg/dL   Glucose, Bld 096 (H) 65 - 99 mg/dL   Calcium, Ion 0.45 4.09 - 1.40 mmol/L   TCO2 27 0 - 100 mmol/L   Hemoglobin 16.3 13.0 - 17.0 g/dL   HCT 81.1 91.4 - 78.2 %   No results found for this or any previous visit (from the past 240 hour(s)). Creatinine:  Recent Labs  02/14/17 1052  CREATININE 1.60*   Baseline Creatinine: 1.6  Impression/Assessment:  75yo with BPh with LUTS, urinary retention  Plan:  The risks/benefits/alternatives to Urolift was explaiend to the patient and he understands and wisehs to proceed with surgery  David Baxter 02/14/2017, 12:08 PM

## 2017-02-14 NOTE — Op Note (Signed)
   PREOPERATIVE DIAGNOSIS: Benign prostatic hypertrophy with bladder outlet obstruction.  POSTOPERATIVE DIAGNOSIS: Benign prostatic hypertrophy with bladder outlet obstruction.  PROCEDURE: Cystoscopy with implantation of UroLift devices, 5 implants.  SURGEON: Wilkie Aye, M.D.  ANESTHESIA: General  ANTIBIOTICS: ancef  SPECIMEN: None.  DRAINS: A 16-French Foley catheter.  BLOOD LOSS: Minimal.  COMPLICATIONS: None.  INDICATIONS:The Patient is an 76 year old white male with BPH and bladder outlet obstruction. He has failed medical therapy and has elected UroLift for definitive treatment.  FINDINGS OF PROCEDURE: He was taken to the operating room where a genral anesthetic was induced. He was placed in lithotomy position and was fitted with PAS hose. His perineum and genitalia were prepped with chlorhexidine, and he was draped in usual sterile fashion.  Cystoscopy was performed using the UroLift scope and 0 degree lens. Examination revealed a normal urethra. The external sphincter was intact. Prostatic urethra was approximately 4 cm in length with lateral lobe enlargement. There was also little bit of bladder neck elevation. Inspection of bladder revealed mild-to-moderate trabeculation with no tumors, stones, or inflammation. No cellules or diverticula were noted. Ureteral orifices were in their normal anatomic position effluxing clear urine.  After initial cystoscopy, the visual obturator was replaced with the first UroLift device. This was turned to the 9 o'clock position and pulled back to the veru and then slightly advanced. Pressure was then applied to the right lateral lobe and the UroLift device was deployed.  The second UroLift device was then inserted and applied to the left lateral lobe at 3 o'clock and deployed in the mid prostatic urethra. After this, there was still some apparent obstruction closer to the bladder neck. So a second  level of UroLift your left device was applied between the mid urethra and the proximal urethra providing further patency to the prostatic urethra. At this point, there was mild bleeding but the patient did have a spinal anesthetic. So it was thought that a Foley catheter was indicated. The scope was removed and a 16-French Foley catheter was inserted without difficulty. The balloon was filled with 10 mL sterile fluid, and the catheter was placed to straight drainage.  COMPLICATIONS: None   CONDITION: Stable, extubated, transferred to PACU  PLAN: The patient will be discharged home and followup in 2 days for a voiding trial.

## 2017-02-14 NOTE — Anesthesia Procedure Notes (Signed)
Procedure Name: LMA Insertion Date/Time: 02/14/2017 12:18 PM Performed by: Leilani Able Pre-anesthesia Checklist: Patient identified, Emergency Drugs available, Suction available and Patient being monitored Patient Re-evaluated:Patient Re-evaluated prior to inductionOxygen Delivery Method: Circle system utilized Preoxygenation: Pre-oxygenation with 100% oxygen Intubation Type: IV induction Ventilation: Mask ventilation without difficulty LMA: LMA inserted LMA Size: 4.0 Number of attempts: 1 Airway Equipment and Method: Bite block Placement Confirmation: positive ETCO2 Tube secured with: Tape Dental Injury: Teeth and Oropharynx as per pre-operative assessment

## 2017-02-14 NOTE — Anesthesia Preprocedure Evaluation (Signed)
Anesthesia Evaluation  Patient identified by MRN, date of birth, ID band Patient awake    Reviewed: Allergy & Precautions, NPO status , Patient's Chart, lab work & pertinent test results  History of Anesthesia Complications Negative for: history of anesthetic complications  Airway Mallampati: II  TM Distance: >3 FB Neck ROM: Full    Dental  (+) Poor Dentition, Missing   Pulmonary neg pulmonary ROS,    breath sounds clear to auscultation       Cardiovascular hypertension, + CAD and + Cardiac Stents   Rhythm:Regular Rate:Normal     Neuro/Psych CVA    GI/Hepatic   Endo/Other  diabetesHypothyroidism   Renal/GU Renal InsufficiencyRenal disease     Musculoskeletal   Abdominal   Peds  Hematology   Anesthesia Other Findings   Reproductive/Obstetrics                             Anesthesia Physical Anesthesia Plan  ASA: III  Anesthesia Plan: General   Post-op Pain Management:    Induction: Intravenous  Airway Management Planned: LMA  Additional Equipment:   Intra-op Plan:   Post-operative Plan: Extubation in OR  Informed Consent: I have reviewed the patients History and Physical, chart, labs and discussed the procedure including the risks, benefits and alternatives for the proposed anesthesia with the patient or authorized representative who has indicated his/her understanding and acceptance.   Dental advisory given  Plan Discussed with: CRNA  Anesthesia Plan Comments:         Anesthesia Quick Evaluation

## 2017-02-15 ENCOUNTER — Encounter (HOSPITAL_BASED_OUTPATIENT_CLINIC_OR_DEPARTMENT_OTHER): Payer: Self-pay | Admitting: Urology

## 2017-02-17 DIAGNOSIS — R339 Retention of urine, unspecified: Secondary | ICD-10-CM | POA: Diagnosis not present

## 2017-02-17 DIAGNOSIS — R310 Gross hematuria: Secondary | ICD-10-CM | POA: Diagnosis not present

## 2017-02-24 DIAGNOSIS — R339 Retention of urine, unspecified: Secondary | ICD-10-CM | POA: Diagnosis not present

## 2017-04-09 DIAGNOSIS — I129 Hypertensive chronic kidney disease with stage 1 through stage 4 chronic kidney disease, or unspecified chronic kidney disease: Secondary | ICD-10-CM | POA: Diagnosis not present

## 2017-04-09 DIAGNOSIS — X509XXA Other and unspecified overexertion or strenuous movements or postures, initial encounter: Secondary | ICD-10-CM | POA: Diagnosis not present

## 2017-04-09 DIAGNOSIS — E1122 Type 2 diabetes mellitus with diabetic chronic kidney disease: Secondary | ICD-10-CM | POA: Diagnosis not present

## 2017-04-09 DIAGNOSIS — D649 Anemia, unspecified: Secondary | ICD-10-CM | POA: Diagnosis not present

## 2017-04-09 DIAGNOSIS — E039 Hypothyroidism, unspecified: Secondary | ICD-10-CM | POA: Diagnosis not present

## 2017-04-09 DIAGNOSIS — I700 Atherosclerosis of aorta: Secondary | ICD-10-CM | POA: Diagnosis not present

## 2017-04-09 DIAGNOSIS — E1165 Type 2 diabetes mellitus with hyperglycemia: Secondary | ICD-10-CM | POA: Diagnosis not present

## 2017-04-09 DIAGNOSIS — M109 Gout, unspecified: Secondary | ICD-10-CM | POA: Diagnosis not present

## 2017-04-09 DIAGNOSIS — I2510 Atherosclerotic heart disease of native coronary artery without angina pectoris: Secondary | ICD-10-CM | POA: Diagnosis not present

## 2017-04-09 DIAGNOSIS — S99911A Unspecified injury of right ankle, initial encounter: Secondary | ICD-10-CM | POA: Diagnosis not present

## 2017-04-09 DIAGNOSIS — K8020 Calculus of gallbladder without cholecystitis without obstruction: Secondary | ICD-10-CM | POA: Diagnosis not present

## 2017-04-09 DIAGNOSIS — S99921A Unspecified injury of right foot, initial encounter: Secondary | ICD-10-CM | POA: Diagnosis not present

## 2017-04-09 DIAGNOSIS — N183 Chronic kidney disease, stage 3 (moderate): Secondary | ICD-10-CM | POA: Diagnosis not present

## 2017-04-09 DIAGNOSIS — N136 Pyonephrosis: Secondary | ICD-10-CM | POA: Diagnosis not present

## 2017-04-10 DIAGNOSIS — D72828 Other elevated white blood cell count: Secondary | ICD-10-CM | POA: Diagnosis not present

## 2017-04-10 DIAGNOSIS — D649 Anemia, unspecified: Secondary | ICD-10-CM | POA: Diagnosis not present

## 2017-04-10 DIAGNOSIS — N390 Urinary tract infection, site not specified: Secondary | ICD-10-CM | POA: Diagnosis not present

## 2017-04-10 DIAGNOSIS — I2510 Atherosclerotic heart disease of native coronary artery without angina pectoris: Secondary | ICD-10-CM | POA: Diagnosis not present

## 2017-04-10 DIAGNOSIS — E1165 Type 2 diabetes mellitus with hyperglycemia: Secondary | ICD-10-CM | POA: Diagnosis not present

## 2017-04-10 DIAGNOSIS — E039 Hypothyroidism, unspecified: Secondary | ICD-10-CM | POA: Diagnosis not present

## 2017-04-10 DIAGNOSIS — N400 Enlarged prostate without lower urinary tract symptoms: Secondary | ICD-10-CM | POA: Diagnosis not present

## 2017-04-10 DIAGNOSIS — N159 Renal tubulo-interstitial disease, unspecified: Secondary | ICD-10-CM | POA: Diagnosis not present

## 2017-04-10 DIAGNOSIS — N12 Tubulo-interstitial nephritis, not specified as acute or chronic: Secondary | ICD-10-CM | POA: Diagnosis not present

## 2017-04-10 DIAGNOSIS — R2241 Localized swelling, mass and lump, right lower limb: Secondary | ICD-10-CM | POA: Diagnosis not present

## 2017-04-10 DIAGNOSIS — N1330 Unspecified hydronephrosis: Secondary | ICD-10-CM | POA: Diagnosis not present

## 2017-04-10 DIAGNOSIS — R509 Fever, unspecified: Secondary | ICD-10-CM | POA: Diagnosis not present

## 2017-04-11 DIAGNOSIS — K8020 Calculus of gallbladder without cholecystitis without obstruction: Secondary | ICD-10-CM | POA: Diagnosis not present

## 2017-04-11 DIAGNOSIS — A498 Other bacterial infections of unspecified site: Secondary | ICD-10-CM | POA: Diagnosis not present

## 2017-04-11 DIAGNOSIS — N136 Pyonephrosis: Secondary | ICD-10-CM | POA: Diagnosis not present

## 2017-04-11 DIAGNOSIS — E1165 Type 2 diabetes mellitus with hyperglycemia: Secondary | ICD-10-CM | POA: Diagnosis not present

## 2017-04-11 DIAGNOSIS — N390 Urinary tract infection, site not specified: Secondary | ICD-10-CM | POA: Diagnosis not present

## 2017-04-11 DIAGNOSIS — N135 Crossing vessel and stricture of ureter without hydronephrosis: Secondary | ICD-10-CM | POA: Diagnosis not present

## 2017-04-11 DIAGNOSIS — N159 Renal tubulo-interstitial disease, unspecified: Secondary | ICD-10-CM | POA: Diagnosis not present

## 2017-04-11 DIAGNOSIS — D649 Anemia, unspecified: Secondary | ICD-10-CM | POA: Diagnosis not present

## 2017-04-11 DIAGNOSIS — R2241 Localized swelling, mass and lump, right lower limb: Secondary | ICD-10-CM | POA: Diagnosis not present

## 2017-04-11 DIAGNOSIS — I2510 Atherosclerotic heart disease of native coronary artery without angina pectoris: Secondary | ICD-10-CM | POA: Diagnosis not present

## 2017-04-11 DIAGNOSIS — N320 Bladder-neck obstruction: Secondary | ICD-10-CM | POA: Diagnosis not present

## 2017-04-11 DIAGNOSIS — M109 Gout, unspecified: Secondary | ICD-10-CM | POA: Diagnosis not present

## 2017-04-11 DIAGNOSIS — D72829 Elevated white blood cell count, unspecified: Secondary | ICD-10-CM | POA: Diagnosis not present

## 2017-04-11 DIAGNOSIS — I129 Hypertensive chronic kidney disease with stage 1 through stage 4 chronic kidney disease, or unspecified chronic kidney disease: Secondary | ICD-10-CM | POA: Diagnosis not present

## 2017-04-11 DIAGNOSIS — Q6239 Other obstructive defects of renal pelvis and ureter: Secondary | ICD-10-CM | POA: Diagnosis not present

## 2017-04-11 DIAGNOSIS — R509 Fever, unspecified: Secondary | ICD-10-CM | POA: Diagnosis not present

## 2017-04-11 DIAGNOSIS — R339 Retention of urine, unspecified: Secondary | ICD-10-CM | POA: Diagnosis not present

## 2017-04-11 DIAGNOSIS — E039 Hypothyroidism, unspecified: Secondary | ICD-10-CM | POA: Diagnosis not present

## 2017-04-11 DIAGNOSIS — N1330 Unspecified hydronephrosis: Secondary | ICD-10-CM | POA: Diagnosis not present

## 2017-04-11 DIAGNOSIS — E119 Type 2 diabetes mellitus without complications: Secondary | ICD-10-CM | POA: Diagnosis not present

## 2017-04-11 DIAGNOSIS — N12 Tubulo-interstitial nephritis, not specified as acute or chronic: Secondary | ICD-10-CM | POA: Diagnosis not present

## 2017-04-11 DIAGNOSIS — K5730 Diverticulosis of large intestine without perforation or abscess without bleeding: Secondary | ICD-10-CM | POA: Diagnosis not present

## 2017-04-11 DIAGNOSIS — N400 Enlarged prostate without lower urinary tract symptoms: Secondary | ICD-10-CM | POA: Diagnosis not present

## 2017-04-11 DIAGNOSIS — D72828 Other elevated white blood cell count: Secondary | ICD-10-CM | POA: Diagnosis not present

## 2017-04-11 DIAGNOSIS — E1122 Type 2 diabetes mellitus with diabetic chronic kidney disease: Secondary | ICD-10-CM | POA: Diagnosis not present

## 2017-04-11 DIAGNOSIS — M79671 Pain in right foot: Secondary | ICD-10-CM | POA: Diagnosis not present

## 2017-04-11 DIAGNOSIS — N183 Chronic kidney disease, stage 3 (moderate): Secondary | ICD-10-CM | POA: Diagnosis not present

## 2017-04-28 DIAGNOSIS — N401 Enlarged prostate with lower urinary tract symptoms: Secondary | ICD-10-CM | POA: Diagnosis not present

## 2017-04-28 DIAGNOSIS — R351 Nocturia: Secondary | ICD-10-CM | POA: Diagnosis not present

## 2017-04-28 DIAGNOSIS — N1339 Other hydronephrosis: Secondary | ICD-10-CM | POA: Diagnosis not present

## 2017-05-09 DIAGNOSIS — I10 Essential (primary) hypertension: Secondary | ICD-10-CM | POA: Diagnosis not present

## 2017-05-09 DIAGNOSIS — E1122 Type 2 diabetes mellitus with diabetic chronic kidney disease: Secondary | ICD-10-CM | POA: Diagnosis not present

## 2017-05-09 DIAGNOSIS — N401 Enlarged prostate with lower urinary tract symptoms: Secondary | ICD-10-CM | POA: Diagnosis not present

## 2017-05-09 DIAGNOSIS — N183 Chronic kidney disease, stage 3 (moderate): Secondary | ICD-10-CM | POA: Diagnosis not present

## 2017-05-09 DIAGNOSIS — I2510 Atherosclerotic heart disease of native coronary artery without angina pectoris: Secondary | ICD-10-CM | POA: Diagnosis not present

## 2017-05-09 DIAGNOSIS — M10072 Idiopathic gout, left ankle and foot: Secondary | ICD-10-CM | POA: Diagnosis not present

## 2017-05-09 DIAGNOSIS — E782 Mixed hyperlipidemia: Secondary | ICD-10-CM | POA: Diagnosis not present

## 2017-05-09 DIAGNOSIS — H524 Presbyopia: Secondary | ICD-10-CM | POA: Diagnosis not present

## 2017-05-09 DIAGNOSIS — E038 Other specified hypothyroidism: Secondary | ICD-10-CM | POA: Diagnosis not present

## 2017-05-09 DIAGNOSIS — H40003 Preglaucoma, unspecified, bilateral: Secondary | ICD-10-CM | POA: Diagnosis not present

## 2017-05-30 DIAGNOSIS — J020 Streptococcal pharyngitis: Secondary | ICD-10-CM | POA: Diagnosis not present

## 2017-05-31 DIAGNOSIS — J028 Acute pharyngitis due to other specified organisms: Secondary | ICD-10-CM | POA: Diagnosis not present

## 2017-05-31 DIAGNOSIS — E875 Hyperkalemia: Secondary | ICD-10-CM | POA: Diagnosis not present

## 2017-06-08 DIAGNOSIS — E876 Hypokalemia: Secondary | ICD-10-CM | POA: Diagnosis not present

## 2017-06-13 DIAGNOSIS — H9209 Otalgia, unspecified ear: Secondary | ICD-10-CM | POA: Diagnosis not present

## 2017-06-24 DIAGNOSIS — Z8709 Personal history of other diseases of the respiratory system: Secondary | ICD-10-CM | POA: Diagnosis not present

## 2017-06-24 DIAGNOSIS — R112 Nausea with vomiting, unspecified: Secondary | ICD-10-CM | POA: Diagnosis not present

## 2017-06-24 DIAGNOSIS — H938X1 Other specified disorders of right ear: Secondary | ICD-10-CM | POA: Diagnosis not present

## 2017-06-24 DIAGNOSIS — H9191 Unspecified hearing loss, right ear: Secondary | ICD-10-CM | POA: Diagnosis not present

## 2017-06-24 DIAGNOSIS — H6122 Impacted cerumen, left ear: Secondary | ICD-10-CM | POA: Diagnosis not present

## 2017-06-24 DIAGNOSIS — R1310 Dysphagia, unspecified: Secondary | ICD-10-CM | POA: Diagnosis not present

## 2017-06-24 DIAGNOSIS — R490 Dysphonia: Secondary | ICD-10-CM | POA: Diagnosis not present

## 2017-06-24 DIAGNOSIS — R42 Dizziness and giddiness: Secondary | ICD-10-CM | POA: Diagnosis not present

## 2017-06-24 DIAGNOSIS — J342 Deviated nasal septum: Secondary | ICD-10-CM | POA: Diagnosis not present

## 2017-06-24 DIAGNOSIS — H903 Sensorineural hearing loss, bilateral: Secondary | ICD-10-CM | POA: Diagnosis not present

## 2017-06-29 DIAGNOSIS — M25571 Pain in right ankle and joints of right foot: Secondary | ICD-10-CM | POA: Diagnosis not present

## 2017-07-29 DIAGNOSIS — N1339 Other hydronephrosis: Secondary | ICD-10-CM | POA: Diagnosis not present

## 2017-07-29 DIAGNOSIS — R339 Retention of urine, unspecified: Secondary | ICD-10-CM | POA: Diagnosis not present

## 2017-08-11 DIAGNOSIS — N250 Renal osteodystrophy: Secondary | ICD-10-CM | POA: Diagnosis not present

## 2017-08-11 DIAGNOSIS — M109 Gout, unspecified: Secondary | ICD-10-CM | POA: Diagnosis not present

## 2017-08-11 DIAGNOSIS — E1169 Type 2 diabetes mellitus with other specified complication: Secondary | ICD-10-CM | POA: Diagnosis not present

## 2017-08-11 DIAGNOSIS — I10 Essential (primary) hypertension: Secondary | ICD-10-CM | POA: Diagnosis not present

## 2017-08-11 DIAGNOSIS — E875 Hyperkalemia: Secondary | ICD-10-CM | POA: Diagnosis not present

## 2017-08-11 DIAGNOSIS — E039 Hypothyroidism, unspecified: Secondary | ICD-10-CM | POA: Diagnosis not present

## 2017-08-11 DIAGNOSIS — D649 Anemia, unspecified: Secondary | ICD-10-CM | POA: Diagnosis not present

## 2017-08-11 DIAGNOSIS — R809 Proteinuria, unspecified: Secondary | ICD-10-CM | POA: Diagnosis not present

## 2017-08-11 DIAGNOSIS — N189 Chronic kidney disease, unspecified: Secondary | ICD-10-CM | POA: Diagnosis not present

## 2017-08-11 DIAGNOSIS — N200 Calculus of kidney: Secondary | ICD-10-CM | POA: Diagnosis not present

## 2017-08-11 DIAGNOSIS — E211 Secondary hyperparathyroidism, not elsewhere classified: Secondary | ICD-10-CM | POA: Diagnosis not present

## 2017-11-14 DIAGNOSIS — I2510 Atherosclerotic heart disease of native coronary artery without angina pectoris: Secondary | ICD-10-CM | POA: Diagnosis not present

## 2017-11-14 DIAGNOSIS — N401 Enlarged prostate with lower urinary tract symptoms: Secondary | ICD-10-CM | POA: Diagnosis not present

## 2017-11-14 DIAGNOSIS — M10072 Idiopathic gout, left ankle and foot: Secondary | ICD-10-CM | POA: Diagnosis not present

## 2017-11-14 DIAGNOSIS — N183 Chronic kidney disease, stage 3 (moderate): Secondary | ICD-10-CM | POA: Diagnosis not present

## 2017-11-14 DIAGNOSIS — E782 Mixed hyperlipidemia: Secondary | ICD-10-CM | POA: Diagnosis not present

## 2017-11-14 DIAGNOSIS — E038 Other specified hypothyroidism: Secondary | ICD-10-CM | POA: Diagnosis not present

## 2017-11-14 DIAGNOSIS — E1122 Type 2 diabetes mellitus with diabetic chronic kidney disease: Secondary | ICD-10-CM | POA: Diagnosis not present

## 2017-11-14 DIAGNOSIS — I10 Essential (primary) hypertension: Secondary | ICD-10-CM | POA: Diagnosis not present

## 2017-11-17 DIAGNOSIS — R339 Retention of urine, unspecified: Secondary | ICD-10-CM | POA: Diagnosis not present

## 2017-11-17 DIAGNOSIS — N401 Enlarged prostate with lower urinary tract symptoms: Secondary | ICD-10-CM | POA: Diagnosis not present

## 2017-11-23 DIAGNOSIS — E1169 Type 2 diabetes mellitus with other specified complication: Secondary | ICD-10-CM | POA: Diagnosis not present

## 2017-11-23 DIAGNOSIS — I10 Essential (primary) hypertension: Secondary | ICD-10-CM | POA: Diagnosis not present

## 2017-11-23 DIAGNOSIS — D649 Anemia, unspecified: Secondary | ICD-10-CM | POA: Diagnosis not present

## 2017-11-23 DIAGNOSIS — N189 Chronic kidney disease, unspecified: Secondary | ICD-10-CM | POA: Diagnosis not present

## 2017-11-23 DIAGNOSIS — E875 Hyperkalemia: Secondary | ICD-10-CM | POA: Diagnosis not present

## 2017-11-23 DIAGNOSIS — E559 Vitamin D deficiency, unspecified: Secondary | ICD-10-CM | POA: Diagnosis not present

## 2017-11-23 DIAGNOSIS — R809 Proteinuria, unspecified: Secondary | ICD-10-CM | POA: Diagnosis not present

## 2017-11-23 DIAGNOSIS — E039 Hypothyroidism, unspecified: Secondary | ICD-10-CM | POA: Diagnosis not present

## 2017-11-23 DIAGNOSIS — E211 Secondary hyperparathyroidism, not elsewhere classified: Secondary | ICD-10-CM | POA: Diagnosis not present

## 2017-11-23 DIAGNOSIS — N200 Calculus of kidney: Secondary | ICD-10-CM | POA: Diagnosis not present

## 2017-11-23 DIAGNOSIS — N259 Disorder resulting from impaired renal tubular function, unspecified: Secondary | ICD-10-CM | POA: Diagnosis not present

## 2017-11-28 DIAGNOSIS — H40003 Preglaucoma, unspecified, bilateral: Secondary | ICD-10-CM | POA: Diagnosis not present

## 2017-11-29 DIAGNOSIS — Z6822 Body mass index (BMI) 22.0-22.9, adult: Secondary | ICD-10-CM | POA: Diagnosis not present

## 2017-11-29 DIAGNOSIS — Z0000 Encounter for general adult medical examination without abnormal findings: Secondary | ICD-10-CM | POA: Diagnosis not present

## 2017-12-20 DIAGNOSIS — N401 Enlarged prostate with lower urinary tract symptoms: Secondary | ICD-10-CM | POA: Diagnosis not present

## 2017-12-20 DIAGNOSIS — R339 Retention of urine, unspecified: Secondary | ICD-10-CM | POA: Diagnosis not present

## 2018-03-29 DIAGNOSIS — N189 Chronic kidney disease, unspecified: Secondary | ICD-10-CM | POA: Diagnosis not present

## 2018-03-29 DIAGNOSIS — N200 Calculus of kidney: Secondary | ICD-10-CM | POA: Diagnosis not present

## 2018-03-29 DIAGNOSIS — E875 Hyperkalemia: Secondary | ICD-10-CM | POA: Diagnosis not present

## 2018-03-29 DIAGNOSIS — E1169 Type 2 diabetes mellitus with other specified complication: Secondary | ICD-10-CM | POA: Diagnosis not present

## 2018-03-29 DIAGNOSIS — R809 Proteinuria, unspecified: Secondary | ICD-10-CM | POA: Diagnosis not present

## 2018-03-29 DIAGNOSIS — I10 Essential (primary) hypertension: Secondary | ICD-10-CM | POA: Diagnosis not present

## 2018-03-29 DIAGNOSIS — E039 Hypothyroidism, unspecified: Secondary | ICD-10-CM | POA: Diagnosis not present

## 2018-03-29 DIAGNOSIS — R309 Painful micturition, unspecified: Secondary | ICD-10-CM | POA: Diagnosis not present

## 2018-03-31 DIAGNOSIS — E782 Mixed hyperlipidemia: Secondary | ICD-10-CM | POA: Diagnosis not present

## 2018-03-31 DIAGNOSIS — E1122 Type 2 diabetes mellitus with diabetic chronic kidney disease: Secondary | ICD-10-CM | POA: Diagnosis not present

## 2018-03-31 DIAGNOSIS — R1312 Dysphagia, oropharyngeal phase: Secondary | ICD-10-CM | POA: Diagnosis not present

## 2018-03-31 DIAGNOSIS — E038 Other specified hypothyroidism: Secondary | ICD-10-CM | POA: Diagnosis not present

## 2018-03-31 DIAGNOSIS — N183 Chronic kidney disease, stage 3 (moderate): Secondary | ICD-10-CM | POA: Diagnosis not present

## 2018-03-31 DIAGNOSIS — E1159 Type 2 diabetes mellitus with other circulatory complications: Secondary | ICD-10-CM | POA: Diagnosis not present

## 2018-03-31 DIAGNOSIS — I10 Essential (primary) hypertension: Secondary | ICD-10-CM | POA: Diagnosis not present

## 2018-03-31 DIAGNOSIS — I2510 Atherosclerotic heart disease of native coronary artery without angina pectoris: Secondary | ICD-10-CM | POA: Diagnosis not present

## 2018-03-31 DIAGNOSIS — Z6822 Body mass index (BMI) 22.0-22.9, adult: Secondary | ICD-10-CM | POA: Diagnosis not present

## 2018-04-07 DIAGNOSIS — E039 Hypothyroidism, unspecified: Secondary | ICD-10-CM | POA: Diagnosis not present

## 2018-04-07 DIAGNOSIS — N390 Urinary tract infection, site not specified: Secondary | ICD-10-CM | POA: Diagnosis not present

## 2018-04-07 DIAGNOSIS — E1165 Type 2 diabetes mellitus with hyperglycemia: Secondary | ICD-10-CM | POA: Diagnosis not present

## 2018-04-07 DIAGNOSIS — I10 Essential (primary) hypertension: Secondary | ICD-10-CM | POA: Diagnosis not present

## 2018-04-07 DIAGNOSIS — A419 Sepsis, unspecified organism: Secondary | ICD-10-CM | POA: Diagnosis not present

## 2018-04-07 DIAGNOSIS — R339 Retention of urine, unspecified: Secondary | ICD-10-CM | POA: Diagnosis not present

## 2018-04-07 DIAGNOSIS — N179 Acute kidney failure, unspecified: Secondary | ICD-10-CM | POA: Diagnosis not present

## 2018-04-07 DIAGNOSIS — N3001 Acute cystitis with hematuria: Secondary | ICD-10-CM | POA: Diagnosis not present

## 2018-04-07 DIAGNOSIS — Z7984 Long term (current) use of oral hypoglycemic drugs: Secondary | ICD-10-CM | POA: Diagnosis not present

## 2018-04-07 DIAGNOSIS — E119 Type 2 diabetes mellitus without complications: Secondary | ICD-10-CM | POA: Diagnosis not present

## 2018-04-07 DIAGNOSIS — I2510 Atherosclerotic heart disease of native coronary artery without angina pectoris: Secondary | ICD-10-CM | POA: Diagnosis not present

## 2018-04-07 DIAGNOSIS — D72829 Elevated white blood cell count, unspecified: Secondary | ICD-10-CM | POA: Diagnosis not present

## 2018-04-07 NOTE — ED Provider Notes (Signed)
77 y.o. male with past medical history significant for DM, CAD, kidney stones, HTN, UTI, and hypothyroid who presents from daughter's home via personal vehicle with chief complaint of urinary pain. Pt presents to the ED and in conjunction with his family, reports that he may have a UTI. PT currently reports dysuria (w/ burning quality), increased urinary frequency, fever, and emesis. Pt was treated at [facility] prior to ED arrival and has paperwork from the encounter which showed that he had a WBC count of 23,000, creatinine of 1.7, and fever of 100F. Family also reports pt was diagnosed with urosepsis in June 2018 during a vacation in [place]. Pt lives out of state and is currently in [place] visiting his daughters. Pt reports he has only taken one dosage of tylenol this morning. Family reports that the pt tried taking all of his daily medications earlier and vomited shortly afterwards. Pt denies any current nausea.    There are no other acute medical concerns at this time.    Surgical hx - hernia repair, coronary stent placement  Social hx - Tobacco use: non-smoker, Alcohol Use: non-drinker, Drug Use: denies    PCP: Other, Phys, MD    Note written by Alonza Bogus, Scribe, as dictated by Casimiro Needle, MD 10:06PM.      The history is provided by the spouse, the patient and a relative. No language interpreter was used.        Past Medical History:   Diagnosis Date   ??? CAD (coronary artery disease)    ??? Diabetes (HCC)    ??? Gout    ??? Hypertension    ??? Hypothyroid    ??? Kidney stones    ??? Pneumonia        Past Surgical History:   Procedure Laterality Date   ??? HX CORONARY STENT PLACEMENT     ??? HX HERNIA REPAIR           No family history on file.    Social History     Socioeconomic History   ??? Marital status: UNKNOWN     Spouse name: Not on file   ??? Number of children: Not on file   ??? Years of education: Not on file   ??? Highest education level: Not on file   Occupational History   ??? Not on file    Social Needs   ??? Financial resource strain: Not on file   ??? Food insecurity:     Worry: Not on file     Inability: Not on file   ??? Transportation needs:     Medical: Not on file     Non-medical: Not on file   Tobacco Use   ??? Smoking status: Never Smoker   Substance and Sexual Activity   ??? Alcohol use: No   ??? Drug use: No   ??? Sexual activity: Not on file   Lifestyle   ??? Physical activity:     Days per week: Not on file     Minutes per session: Not on file   ??? Stress: Not on file   Relationships   ??? Social connections:     Talks on phone: Not on file     Gets together: Not on file     Attends religious service: Not on file     Active member of club or organization: Not on file     Attends meetings of clubs or organizations: Not on file     Relationship status: Not  on file   ??? Intimate partner violence:     Fear of current or ex partner: Not on file     Emotionally abused: Not on file     Physically abused: Not on file     Forced sexual activity: Not on file   Other Topics Concern   ??? Not on file   Social History Narrative   ??? Not on file         ALLERGIES: Aspirin    Review of Systems   Constitutional: Positive for fever. Negative for chills, diaphoresis and fatigue.   HENT: Negative for congestion, rhinorrhea, sinus pressure, sore throat, trouble swallowing and voice change.    Eyes: Negative for photophobia and visual disturbance.   Respiratory: Negative for cough, chest tightness and shortness of breath.    Cardiovascular: Negative for chest pain, palpitations and leg swelling.   Gastrointestinal: Positive for vomiting. Negative for abdominal pain, blood in stool, constipation, diarrhea and nausea.   Genitourinary: Positive for dysuria and frequency.   Musculoskeletal: Negative for arthralgias, myalgias and neck pain.   Neurological: Negative for dizziness, weakness, light-headedness, numbness and headaches.   All other systems reviewed and are negative.      Vitals:    04/07/18 2158   BP: (!) 184/92   Pulse: (!)  114   Resp: 12   Temp: 98.4 ??F (36.9 ??C)   SpO2: 98%   Height: 5\' 11"  (1.803 m)            Physical Exam   Constitutional: He is oriented to person, place, and time. He appears well-developed and well-nourished. No distress.   HENT:   Head: Normocephalic and atraumatic.   Nose: Nose normal.   Mouth/Throat: Oropharynx is clear and moist. No oropharyngeal exudate.   Eyes: Conjunctivae and EOM are normal. Right eye exhibits no discharge. Left eye exhibits no discharge. No scleral icterus.   Neck: Normal range of motion. Neck supple. No JVD present. No tracheal deviation present. No thyromegaly present.   Cardiovascular: Normal rate, regular rhythm, normal heart sounds and intact distal pulses. Exam reveals no gallop and no friction rub.   No murmur heard.  Pulmonary/Chest: Effort normal and breath sounds normal. No stridor. No respiratory distress. He has no wheezes. He has no rales. He exhibits no tenderness.   Abdominal: Bowel sounds are normal. He exhibits no distension and no mass. There is no tenderness. There is no rebound.   Musculoskeletal: Normal range of motion. He exhibits no edema or tenderness.   Lymphadenopathy:     He has no cervical adenopathy.   Neurological: He is alert and oriented to person, place, and time. No cranial nerve deficit. Coordination normal.   Skin: Skin is warm and dry. No rash noted. He is not diaphoretic. No erythema. No pallor.   Psychiatric: He has a normal mood and affect. His behavior is normal. Judgment and thought content normal.   Nursing note and vitals reviewed.     Note written by Alonza Bogushristopher Largaespada, Scribe, as dictated by Casimiro NeedleAnderson, Emmerson Shuffield W, MD 10:06 PM    MDM  Number of Diagnoses or Management Options  Acute cystitis with hematuria:   AKI (acute kidney injury) [facility](HCC):      Amount and/or Complexity of Data Reviewed  Clinical lab tests: ordered and reviewed  Tests in the radiology section of CPT??: reviewed and ordered  Review and summarize past medical records:  yes  Discuss the patient with other providers: yes  Independent visualization of images,  tracings, or specimens: yes    Risk of Complications, Morbidity, and/or Mortality  Presenting problems: moderate  Diagnostic procedures: moderate  Management options: moderate    Patient Progress  Patient progress: stable         Procedures

## 2018-04-07 NOTE — ED Notes (Signed)
Triage: Pt advises of increase frequency in urination and fever.

## 2018-04-07 NOTE — ED Triage Notes (Signed)
Triage: Pt advises of increase frequency in urination and fever.

## 2018-04-07 NOTE — ED Provider Notes (Signed)
77 y.o. male with past medical history significant for DM, CAD, kidney stones, HTN, UTI, and hypothyroid who presents from daughter's home via personal vehicle with chief complaint of urinary pain. Pt presents to the ED and in conjunction with his family, reports that he may have a UTI. PT currently reports dysuria (w/ burning quality), increased urinary frequency, fever, and emesis. Pt was treated at [facility] prior to ED arrival and has paperwork from the encounter which showed that he had a WBC count of 23,000, creatinine of 1.7, and fever of 100F. Family also reports pt was diagnosed with urosepsis in June 2018 during a vacation in [place]. Pt lives out of state and is currently in [place] visiting his daughters. Pt reports he has only taken one dosage of tylenol this morning. Family reports that the pt tried taking all of his daily medications earlier and vomited shortly afterwards. Pt denies any current nausea.    There are no other acute medical concerns at this time.    Surgical hx - hernia repair, coronary stent placement  Social hx - Tobacco use: non-smoker, Alcohol Use: non-drinker, Drug Use: denies    PCP: Other, Phys, MD    Note written by Alonza Bogus, Scribe, as dictated by Casimiro Needle, MD 10:06PM.      The history is provided by the spouse, the patient and a relative. No language interpreter was used.        Past Medical History:   Diagnosis Date   ??? CAD (coronary artery disease)    ??? Diabetes (HCC)    ??? Gout    ??? Hypertension    ??? Hypothyroid    ??? Kidney stones    ??? Pneumonia        Past Surgical History:   Procedure Laterality Date   ??? HX CORONARY STENT PLACEMENT     ??? HX HERNIA REPAIR           No family history on file.    Social History     Socioeconomic History   ??? Marital status: UNKNOWN     Spouse name: Not on file   ??? Number of children: Not on file   ??? Years of education: Not on file   ??? Highest education level: Not on file   Occupational History   ??? Not on file    Social Needs   ??? Financial resource strain: Not on file   ??? Food insecurity:     Worry: Not on file     Inability: Not on file   ??? Transportation needs:     Medical: Not on file     Non-medical: Not on file   Tobacco Use   ??? Smoking status: Never Smoker   Substance and Sexual Activity   ??? Alcohol use: No   ??? Drug use: No   ??? Sexual activity: Not on file   Lifestyle   ??? Physical activity:     Days per week: Not on file     Minutes per session: Not on file   ??? Stress: Not on file   Relationships   ??? Social connections:     Talks on phone: Not on file     Gets together: Not on file     Attends religious service: Not on file     Active member of club or organization: Not on file     Attends meetings of clubs or organizations: Not on file     Relationship status: Not  on file   ??? Intimate partner violence:     Fear of current or ex partner: Not on file     Emotionally abused: Not on file     Physically abused: Not on file     Forced sexual activity: Not on file   Other Topics Concern   ??? Not on file   Social History Narrative   ??? Not on file         ALLERGIES: Aspirin    Review of Systems   Constitutional: Positive for fever. Negative for chills, diaphoresis and fatigue.   HENT: Negative for congestion, rhinorrhea, sinus pressure, sore throat, trouble swallowing and voice change.    Eyes: Negative for photophobia and visual disturbance.   Respiratory: Negative for cough, chest tightness and shortness of breath.    Cardiovascular: Negative for chest pain, palpitations and leg swelling.   Gastrointestinal: Positive for vomiting. Negative for abdominal pain, blood in stool, constipation, diarrhea and nausea.   Genitourinary: Positive for dysuria and frequency.   Musculoskeletal: Negative for arthralgias, myalgias and neck pain.   Neurological: Negative for dizziness, weakness, light-headedness, numbness and headaches.   All other systems reviewed and are negative.      Vitals:    04/07/18 2158   BP: (!) 184/92    Pulse: (!) 114   Resp: 12   Temp: 98.4 ??F (36.9 ??C)   SpO2: 98%   Height: 5\' 11"  (1.803 m)            Physical Exam   Constitutional: He is oriented to person, place, and time. He appears well-developed and well-nourished. No distress.   HENT:   Head: Normocephalic and atraumatic.   Nose: Nose normal.   Mouth/Throat: Oropharynx is clear and moist. No oropharyngeal exudate.   Eyes: Conjunctivae and EOM are normal. Right eye exhibits no discharge. Left eye exhibits no discharge. No scleral icterus.   Neck: Normal range of motion. Neck supple. No JVD present. No tracheal deviation present. No thyromegaly present.   Cardiovascular: Normal rate, regular rhythm, normal heart sounds and intact distal pulses. Exam reveals no gallop and no friction rub.   No murmur heard.  Pulmonary/Chest: Effort normal and breath sounds normal. No stridor. No respiratory distress. He has no wheezes. He has no rales. He exhibits no tenderness.   Abdominal: Bowel sounds are normal. He exhibits no distension and no mass. There is no tenderness. There is no rebound.   Musculoskeletal: Normal range of motion. He exhibits no edema or tenderness.   Lymphadenopathy:     He has no cervical adenopathy.   Neurological: He is alert and oriented to person, place, and time. No cranial nerve deficit. Coordination normal.   Skin: Skin is warm and dry. No rash noted. He is not diaphoretic. No erythema. No pallor.   Psychiatric: He has a normal mood and affect. His behavior is normal. Judgment and thought content normal.   Nursing note and vitals reviewed.     Note written by Alonza Bogushristopher Largaespada, Scribe, as dictated by Casimiro NeedleAnderson, Arval Brandstetter W, MD 10:06 PM    MDM  Number of Diagnoses or Management Options  Acute cystitis with hematuria:   AKI (acute kidney injury) [facility](HCC):      Amount and/or Complexity of Data Reviewed  Clinical lab tests: ordered and reviewed  Tests in the radiology section of CPT??: reviewed and ordered   Review and summarize past medical records: yes  Discuss the patient with other providers: yes  Independent visualization of images,  tracings, or specimens: yes    Risk of Complications, Morbidity, and/or Mortality  Presenting problems: moderate  Diagnostic procedures: moderate  Management options: moderate    Patient Progress  Patient progress: stable         Procedures

## 2018-04-08 ENCOUNTER — Inpatient Hospital Stay
Admit: 2018-04-08 | Discharge: 2018-04-09 | Disposition: A | Discharge status: Home / Self Care | Payer: MEDICARE | Attending: Internal Medicine | Admitting: Internal Medicine

## 2018-04-08 ENCOUNTER — Inpatient Hospital Stay: Admit: 2018-04-08 | Payer: MEDICARE | Primary: Family Medicine

## 2018-04-08 DIAGNOSIS — I2510 Atherosclerotic heart disease of native coronary artery without angina pectoris: Secondary | ICD-10-CM | POA: Diagnosis not present

## 2018-04-08 DIAGNOSIS — N19 Unspecified kidney failure: Secondary | ICD-10-CM | POA: Diagnosis not present

## 2018-04-08 DIAGNOSIS — R6510 Systemic inflammatory response syndrome (SIRS) of non-infectious origin without acute organ dysfunction: Secondary | ICD-10-CM

## 2018-04-08 DIAGNOSIS — E119 Type 2 diabetes mellitus without complications: Secondary | ICD-10-CM

## 2018-04-08 DIAGNOSIS — Z7984 Long term (current) use of oral hypoglycemic drugs: Secondary | ICD-10-CM | POA: Diagnosis not present

## 2018-04-08 DIAGNOSIS — A419 Sepsis, unspecified organism: Secondary | ICD-10-CM | POA: Diagnosis not present

## 2018-04-08 DIAGNOSIS — M109 Gout, unspecified: Secondary | ICD-10-CM

## 2018-04-08 DIAGNOSIS — N179 Acute kidney failure, unspecified: Secondary | ICD-10-CM | POA: Diagnosis not present

## 2018-04-08 DIAGNOSIS — E039 Hypothyroidism, unspecified: Secondary | ICD-10-CM | POA: Insufficient documentation

## 2018-04-08 DIAGNOSIS — N390 Urinary tract infection, site not specified: Secondary | ICD-10-CM | POA: Diagnosis not present

## 2018-04-08 DIAGNOSIS — I10 Essential (primary) hypertension: Secondary | ICD-10-CM | POA: Diagnosis not present

## 2018-04-08 DIAGNOSIS — R339 Retention of urine, unspecified: Secondary | ICD-10-CM | POA: Diagnosis not present

## 2018-04-08 HISTORY — DX: Gout, unspecified: M10.9

## 2018-04-08 HISTORY — DX: Acute kidney failure, unspecified: N17.9

## 2018-04-08 HISTORY — DX: Urinary tract infection, site not specified: N39.0

## 2018-04-08 HISTORY — DX: Hypothyroidism, unspecified: E03.9

## 2018-04-08 HISTORY — DX: Systemic inflammatory response syndrome (sirs) of non-infectious origin without acute organ dysfunction: R65.10

## 2018-04-08 HISTORY — DX: Type 2 diabetes mellitus without complications: E11.9

## 2018-04-08 LAB — CBC WITH AUTOMATED DIFF
ABS. BASOPHILS: 0 10*3/uL (ref 0.0–0.1)
ABS. EOSINOPHILS: 0 10*3/uL (ref 0.0–0.4)
ABS. IMM. GRANS.: 0.2 10*3/uL — ABNORMAL HIGH (ref 0.00–0.04)
ABS. LYMPHOCYTES: 1 10*3/uL (ref 0.8–3.5)
ABS. MONOCYTES: 3.4 10*3/uL — ABNORMAL HIGH (ref 0.0–1.0)
ABS. NEUTROPHILS: 19.7 10*3/uL — ABNORMAL HIGH (ref 1.8–8.0)
ABSOLUTE NRBC: 0 10*3/uL (ref 0.00–0.01)
BASOPHILS: 0 % (ref 0–1)
EOSINOPHILS: 0 % (ref 0–7)
HCT: 43.8 % (ref 36.6–50.3)
HGB: 14.5 g/dL (ref 12.1–17.0)
IMMATURE GRANULOCYTES: 1 % — ABNORMAL HIGH (ref 0.0–0.5)
LYMPHOCYTES: 4 % — ABNORMAL LOW (ref 12–49)
MCH: 29.5 PG (ref 26.0–34.0)
MCHC: 33.1 g/dL (ref 30.0–36.5)
MCV: 89 FL (ref 80.0–99.0)
MONOCYTES: 14 % — ABNORMAL HIGH (ref 5–13)
MPV: 10.7 FL (ref 8.9–12.9)
NEUTROPHILS: 81 % — ABNORMAL HIGH (ref 32–75)
NRBC: 0 PER 100 WBC
PLATELET: 296 10*3/uL (ref 150–400)
RBC: 4.92 M/uL (ref 4.10–5.70)
RDW: 14.1 % (ref 11.5–14.5)
WBC: 24.3 10*3/uL — ABNORMAL HIGH (ref 4.1–11.1)

## 2018-04-08 LAB — URINALYSIS W/ RFLX MICROSCOPIC
Bilirubin, Urine: NEGATIVE
Bilirubin: NEGATIVE
Glucose, Ur: NEGATIVE mg/dL
Glucose: NEGATIVE mg/dL
Ketone: NEGATIVE mg/dL
Ketones, Urine: NEGATIVE mg/dL
Nitrite, Urine: NEGATIVE
Nitrites: NEGATIVE
Protein, UA: 100 mg/dL — AB
Protein: 100 mg/dL — AB
Specific Gravity, UA: 1.008 (ref 1.003–1.030)
Specific gravity: 1.008 (ref 1.003–1.030)
Urobilinogen, UA, POCT: 0.2 EU/dL (ref 0.2–1.0)
Urobilinogen: 0.2 EU/dL (ref 0.2–1.0)
WBC, UA: >100 /hpf — ABNORMAL HIGH (ref 0–4)
WBC: >100 /hpf — ABNORMAL HIGH (ref 0–4)
pH (UA): 5.5 (ref 5.0–8.0)
pH, UA: 5.5 (ref 5.0–8.0)

## 2018-04-08 LAB — SAMPLES BEING HELD: SAMPLES BEING HELD: 1

## 2018-04-08 LAB — URINE CULTURE HOLD SAMPLE

## 2018-04-08 LAB — METABOLIC PANEL, COMPREHENSIVE
A-G Ratio: 1 — ABNORMAL LOW (ref 1.1–2.2)
ALT (SGPT): 20 U/L (ref 12–78)
AST (SGOT): 24 U/L (ref 15–37)
Albumin: 3.5 g/dL (ref 3.5–5.0)
Alk. phosphatase: 114 U/L (ref 45–117)
Anion gap: 7 mmol/L (ref 5–15)
BUN/Creatinine ratio: 14 (ref 12–20)
BUN: 23 MG/DL — ABNORMAL HIGH (ref 6–20)
Bilirubin, total: 0.8 MG/DL (ref 0.2–1.0)
CO2: 26 mmol/L (ref 21–32)
Calcium: 8.7 MG/DL (ref 8.5–10.1)
Chloride: 105 mmol/L (ref 97–108)
Creatinine: 1.65 MG/DL — ABNORMAL HIGH (ref 0.70–1.30)
GFR est AA: 49 mL/min/{1.73_m2} — ABNORMAL LOW (ref >60)
GFR est non-AA: 41 mL/min/{1.73_m2} — ABNORMAL LOW (ref >60)
Globulin: 3.5 g/dL (ref 2.0–4.0)
Glucose: 173 mg/dL — ABNORMAL HIGH (ref 65–100)
Potassium: 4.4 mmol/L (ref 3.5–5.1)
Protein, total: 7 g/dL (ref 6.4–8.2)
Sodium: 138 mmol/L (ref 136–145)

## 2018-04-08 LAB — GLUCOSE, POC
Glucose (POC): 126 mg/dL — ABNORMAL HIGH (ref 65–100)
Glucose (POC): 119 mg/dL — ABNORMAL HIGH (ref 65–100)
Glucose (POC): 166 mg/dL — ABNORMAL HIGH (ref 65–100)

## 2018-04-08 LAB — POC LACTIC ACID: Lactic Acid (POC): 1.84 mmol/L (ref 0.40–2.00)

## 2018-04-08 LAB — POCT LACTIC ACID: POC Lactic Acid: 1.84 mmol/L (ref 0.40–2.00)

## 2018-04-08 LAB — COMPREHENSIVE METABOLIC PANEL
ALT: 20 U/L (ref 12–78)
AST: 24 U/L (ref 15–37)
Albumin/Globulin Ratio: 1 — ABNORMAL LOW (ref 1.1–2.2)
Albumin: 3.5 g/dL (ref 3.5–5.0)
Alkaline Phosphatase: 114 U/L (ref 45–117)
Anion Gap: 7 mmol/L (ref 5–15)
BUN: 23 MG/DL — ABNORMAL HIGH (ref 6–20)
Bun/Cre Ratio: 14 (ref 12–20)
CO2: 26 mmol/L (ref 21–32)
Calcium: 8.7 MG/DL (ref 8.5–10.1)
Chloride: 105 mmol/L (ref 97–108)
Creatinine: 1.65 MG/DL — ABNORMAL HIGH (ref 0.70–1.30)
EGFR IF NonAfrican American: 41 mL/min/{1.73_m2} — ABNORMAL LOW (ref >60)
GFR African American: 49 mL/min/{1.73_m2} — ABNORMAL LOW (ref >60)
Globulin: 3.5 g/dL (ref 2.0–4.0)
Glucose: 173 mg/dL — ABNORMAL HIGH (ref 65–100)
Potassium: 4.4 mmol/L (ref 3.5–5.1)
Sodium: 138 mmol/L (ref 136–145)
Total Bilirubin: 0.8 MG/DL (ref 0.2–1.0)
Total Protein: 7 g/dL (ref 6.4–8.2)

## 2018-04-08 LAB — CBC WITH AUTO DIFFERENTIAL
Basophils %: 0 % (ref 0–1)
Basophils Absolute: 0 10*3/uL (ref 0.0–0.1)
Eosinophils %: 0 % (ref 0–7)
Eosinophils Absolute: 0 10*3/uL (ref 0.0–0.4)
Granulocyte Absolute Count: 0.2 10*3/uL — ABNORMAL HIGH (ref 0.00–0.04)
Hematocrit: 43.8 % (ref 36.6–50.3)
Hemoglobin: 14.5 g/dL (ref 12.1–17.0)
Immature Granulocytes: 1 % — ABNORMAL HIGH (ref 0.0–0.5)
Lymphocytes %: 4 % — ABNORMAL LOW (ref 12–49)
Lymphocytes Absolute: 1 10*3/uL (ref 0.8–3.5)
MCH: 29.5 PG (ref 26.0–34.0)
MCHC: 33.1 g/dL (ref 30.0–36.5)
MCV: 89 FL (ref 80.0–99.0)
MPV: 10.7 FL (ref 8.9–12.9)
Monocytes %: 14 % — ABNORMAL HIGH (ref 5–13)
Monocytes Absolute: 3.4 10*3/uL — ABNORMAL HIGH (ref 0.0–1.0)
NRBC Absolute: 0 10*3/uL (ref 0.00–0.01)
Neutrophils %: 81 % — ABNORMAL HIGH (ref 32–75)
Neutrophils Absolute: 19.7 10*3/uL — ABNORMAL HIGH (ref 1.8–8.0)
Nucleated RBCs: 0 PER 100 WBC
Platelets: 296 10*3/uL (ref 150–400)
RBC: 4.92 M/uL (ref 4.10–5.70)
RDW: 14.1 % (ref 11.5–14.5)
WBC: 24.3 10*3/uL — ABNORMAL HIGH (ref 4.1–11.1)

## 2018-04-08 LAB — POCT GLUCOSE
POC Glucose: 126 mg/dL — ABNORMAL HIGH (ref 65–100)
POC Glucose: 119 mg/dL — ABNORMAL HIGH (ref 65–100)
POC Glucose: 166 mg/dL — ABNORMAL HIGH (ref 65–100)

## 2018-04-08 MED ORDER — WATER FOR INJECTION, STERILE INJECTION
1 gram | INTRAMUSCULAR | Status: DC
Start: 2018-04-08 — End: 2018-04-08
  Administered 2018-04-08: 04:00:00 via INTRAVENOUS

## 2018-04-08 MED ORDER — HEPARIN (PORCINE) 5,000 UNIT/ML IJ SOLN
5000 unit/mL | Freq: Three times a day (TID) | INTRAMUSCULAR | Status: DC
Start: 2018-04-08 — End: 2018-04-09
  Administered 2018-04-08 – 2018-04-09 (×4): via SUBCUTANEOUS

## 2018-04-08 MED ORDER — SODIUM CHLORIDE 0.9% BOLUS IV
0.9 % | Freq: Once | INTRAVENOUS | Status: AC
Start: 2018-04-08 — End: 2018-04-07
  Administered 2018-04-08: 02:00:00 via INTRAVENOUS

## 2018-04-08 MED ORDER — SODIUM CHLORIDE 0.9 % IJ SYRG
INTRAMUSCULAR | Status: DC | PRN
Start: 2018-04-08 — End: 2018-04-09

## 2018-04-08 MED ORDER — GLUCAGON 1 MG INJECTION
1 mg | INTRAMUSCULAR | Status: DC | PRN
Start: 2018-04-08 — End: 2018-04-09

## 2018-04-08 MED ORDER — INSULIN LISPRO 100 UNIT/ML INJECTION
100 unit/mL | Freq: Four times a day (QID) | SUBCUTANEOUS | Status: DC
Start: 2018-04-08 — End: 2018-04-09
  Administered 2018-04-08: 21:00:00 via SUBCUTANEOUS

## 2018-04-08 MED ORDER — ACETAMINOPHEN 325 MG TABLET
325 mg | ORAL | Status: DC | PRN
Start: 2018-04-08 — End: 2018-04-09
  Administered 2018-04-09: 02:00:00 via ORAL

## 2018-04-08 MED ORDER — SODIUM CHLORIDE 0.9 % IJ SYRG
Freq: Three times a day (TID) | INTRAMUSCULAR | Status: DC
Start: 2018-04-08 — End: 2018-04-09
  Administered 2018-04-08 – 2018-04-09 (×5): via INTRAVENOUS

## 2018-04-08 MED ORDER — GLUCOSE 4 GRAM CHEWABLE TAB
4 gram | ORAL | Status: DC | PRN
Start: 2018-04-08 — End: 2018-04-09

## 2018-04-08 MED ORDER — ONDANSETRON (PF) 4 MG/2 ML INJECTION
4 mg/2 mL | INTRAMUSCULAR | Status: DC | PRN
Start: 2018-04-08 — End: 2018-04-09

## 2018-04-08 MED ORDER — NALOXONE 0.4 MG/ML INJECTION
0.4 mg/mL | INTRAMUSCULAR | Status: DC | PRN
Start: 2018-04-08 — End: 2018-04-09

## 2018-04-08 MED ORDER — DEXTROSE 50% IN WATER (D50W) IV
INTRAVENOUS | Status: DC | PRN
Start: 2018-04-08 — End: 2018-04-09

## 2018-04-08 MED ORDER — CEFTRIAXONE 2 GRAM SOLUTION FOR INJECTION
2 gram | INTRAMUSCULAR | Status: DC
Start: 2018-04-08 — End: 2018-04-09
  Administered 2018-04-09: 02:00:00 via INTRAVENOUS

## 2018-04-08 MED ORDER — SODIUM CHLORIDE 0.9 % IV
INTRAVENOUS | Status: AC
Start: 2018-04-08 — End: 2018-04-09
  Administered 2018-04-08: 05:00:00 via INTRAVENOUS

## 2018-04-08 MED FILL — SODIUM CHLORIDE 0.9 % IV: INTRAVENOUS | Qty: 250

## 2018-04-08 MED FILL — HEPARIN (PORCINE) 5,000 UNIT/ML IJ SOLN: 5000 unit/mL | INTRAMUSCULAR | Qty: 1

## 2018-04-08 MED FILL — CEFTRIAXONE 1 GRAM SOLUTION FOR INJECTION: 1 gram | INTRAMUSCULAR | Qty: 1

## 2018-04-08 MED FILL — GLUCAGEN DIAGNOSTIC KIT 1 MG/ML INJECTION: 1 mg/mL | INTRAMUSCULAR | Qty: 1

## 2018-04-08 MED FILL — SODIUM CHLORIDE 0.9 % IV: INTRAVENOUS | Qty: 1000

## 2018-04-08 MED FILL — BD POSIFLUSH NORMAL SALINE 0.9 % INJECTION SYRINGE: INTRAMUSCULAR | Qty: 40

## 2018-04-08 MED FILL — INSULIN LISPRO 100 UNIT/ML INJECTION: 100 unit/mL | SUBCUTANEOUS | Qty: 1

## 2018-04-08 NOTE — Progress Notes (Signed)
Telepone report given to Tonie GriffithAlexandra Altano RN (oncoming nurse) by Noralyn Pickarlos RN (offgoing nurse). Report included the following information SBAR, Kardex, ED Summary, [facility] and Recent Results.

## 2018-04-08 NOTE — Progress Notes (Signed)
[facility]  [address], [place], [place] [id number]  [phone]      Medical Progress Note      NAME: Brian NationsDallas Dworkin   DOB:  01/15/1941  MRM:  [id number]    Date/Time: 04/08/2018         Subjective:     Chief Complaint:  Patient was seen and examined by me.  Chart reviewed.  Dysuria improving.  No fevers, chills       Objective:       Vitals:       Last 24hrs VS reviewed since prior progress note. Most recent are:    Visit Vitals  BP 137/70 (BP 1 Location: Left arm, BP Patient Position: Supine)   Pulse (!) 102   Temp 98 ??F (36.7 ??C)   Resp 14   Ht 5\' 11"  (1.803 m)   SpO2 98%   BMI 23.43 kg/m??     SpO2 Readings from Last 6 Encounters:   04/08/18 98%   05/10/14 95%            Intake/Output Summary (Last 24 hours) at 04/08/2018 1123  Last data filed at 04/08/2018 1010  Gross per 24 hour   Intake 1360 ml   Output 1350 ml   Net 10 ml        Exam:     Physical Exam:    Gen:  Elderly, frail, NAD  HEENT:  Pink conjunctivae, PERRL, hearing intact to voice, moist mucous membranes  Neck:  Supple, without masses, thyroid non-tender  Resp:  No accessory muscle use, clear breath sounds without wheezes rales or rhonchi  Card:  No murmurs, normal S1, S2 without thrills, bruits or peripheral edema  Abd:  Soft, non-tender, non-distended, normoactive bowel sounds are present  Musc:  No cyanosis or clubbing  Skin:  No rashes   Neuro:  Cranial nerves 3-12 are grossly intact, follows commands appropriately  Psych:  fair insight, oriented to person, place and time, alert    Medications Reviewed: (see below)    Lab Data Reviewed: (see below)    ______________________________________________________________________    Medications:     Current Facility-Administered Medications   Medication Dose Route Frequency   ??? sodium chloride (NS) flush 5-40 mL  5-40 mL IntraVENous Q8H   ??? sodium chloride (NS) flush 5-40 mL  5-40 mL IntraVENous PRN   ??? acetaminophen (TYLENOL) tablet 650 mg  650 mg Oral Q4H PRN   ??? naloxone  (NARCAN) injection 0.4 mg  0.4 mg IntraVENous PRN   ??? ondansetron (ZOFRAN) injection 4 mg  4 mg IntraVENous Q4H PRN   ??? heparin (porcine) injection 5,000 Units  5,000 Units SubCUTAneous Q8H   ??? 0.9% sodium chloride infusion  75 mL/hr IntraVENous CONTINUOUS   ??? glucose chewable tablet 16 g  4 Tab Oral PRN   ??? dextrose (D50) infusion 12.5-25 g  25-50 mL IntraVENous PRN   ??? glucagon (GLUCAGEN) injection 1 mg  1 mg IntraMUSCular PRN   ??? insulin lispro (HUMALOG) injection   SubCUTAneous AC&HS   ??? cefTRIAXone (ROCEPHIN) 2 g in 0.9% sodium chloride (MBP/ADV) 50 mL  2 g IntraVENous Q24H          Lab Review:     Recent Labs     04/07/18  2249   WBC 24.3*   HGB 14.5   HCT 43.8   PLT 296     Recent Labs     04/07/18  2249   NA  138   K 4.4   CL 105   CO2 26   GLU 173*   BUN 23*   CREA 1.65*   CA 8.7   ALB 3.5   TBILI 0.8   SGOT 24   ALT 20     Lab Results   Component Value Date/Time    Glucose (POC) 126 (H) 04/08/2018 06:54 AM          Assessment / Plan:     Principal Problem:    77 yo hx of HTN, DM, CAD, hypothyroid, presented w/ sepsis, UTI, AKI    1) Sepsis/UTI: sepsis POA, now improving.  Likely due to urine retention.  Will continue to monitor cultures.  Will increase IV CTX to cover possible bacteremia    2) AKI: likely a combination of urine retention and pre-renal.  Will check renal U/S to r/o obstruction.  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  Has a Insurance underwriter in [place].  Will place foley today    4) HTN: awaiting med rec.  Can resume home BB    5) DM type 2: A1C pending.  Holding metformin due to AKI. Cont SSI    6) Hypothyroid: cont synthroid after med rec    Total time spent with patient: 35 min                  Care Plan discussed with: Patient, nursing    Discussed:  Care Plan    Prophylaxis:  Hep SQ    Disposition:  Home w/Family           ___________________________________________________    Attending Physician: Dayna Barker, MD

## 2018-04-08 NOTE — ED Notes (Signed)
Notified 4th floor charge nurse of room assignment, nurse to call back for report     Relayed message to [place], [place]     Jessica, RN   ED Charge Nurse

## 2018-04-08 NOTE — H&P (Signed)
[facility]  [address], [place], [place]  [id number]  [phone]) [phone]    Admission History and Physical      NAME:  Brian NationsDallas Freeman   DOB:   03/09/1941   MRN:  [id number]     PCP:  Brian Freeman, Phys, MD     Date/Time:  04/08/2018         Subjective:     CHIEF COMPLAINT: "I've had to pee every hour"     HISTORY OF PRESENT ILLNESS:     Brian Freeman is a 77 y.o.  Caucasian male with PMH of CAD, DM, HTN, hypothyroidism, gout admitted for SIRS and UTI. Per pt, over the last 2-3 days has had increased urinary frequency with fevers/chills. No other complaints currently. Denies N/V/D.     Past Medical History:   Diagnosis Date   ??? CAD (coronary artery disease)    ??? Diabetes (HCC)    ??? Gout    ??? Hypertension    ??? Hypothyroid    ??? Kidney stones    ??? Pneumonia         Past Surgical History:   Procedure Laterality Date   ??? HX CORONARY STENT PLACEMENT     ??? HX HERNIA REPAIR         Social History     Tobacco Use   ??? Smoking status: Never Smoker   Substance Use Topics   ??? Alcohol use: No      FH:   HTN     Allergies   Allergen Reactions   ??? Aspirin Unknown (comments)     Upset stomach        Prior to Admission medications    Medication Sig Start Date End Date Taking? Authorizing Provider   levothyroxine (SYNTHROID) 100 mcg tablet Take 100 mcg by mouth Daily (before breakfast).   Yes Other, Phys, MD   metFORMIN (GLUCOPHAGE) 500 mg tablet Take  by mouth two (2) times daily (with meals).   Yes Other, Phys, MD   aspirin delayed-release 325 mg tablet Take  by mouth every six (6) hours as needed for Pain.   Yes Other, Phys, MD   allopurinol (ZYLOPRIM) 100 mg tablet Take  by mouth daily.   Yes Other, Phys, MD   metoprolol (LOPRESSOR) 25 mg tablet Take  by mouth two (2) times a day.    Other, Phys, MD   simvastatin (ZOCOR) 40 mg tablet Take 20 mg by mouth nightly.    Other, Phys, MD   ondansetron (ZOFRAN ODT) 4 mg disintegrating tablet Take 1 Tab by mouth every eight (8) hours as needed for Nausea. 05/10/14   Jefm BryantBartholomew,  Matthew R, MD         Review of Systems:  (bold if positive, if negative)    Gen:  Eyes:  ENT:  CVS:  Pulm:  GI:    GU:    MS:  Skin:  Psych:  Endo:    Hem:  Renal:    Neuro:     Fevers/chills, urinary frequency        Objective:      VITALS:    Vital signs reviewed; most recent are:    Visit Vitals  BP 133/73 (BP 1 Location: Right arm, BP Patient Position: At rest)   Pulse 91   Temp 98.4 ??F (36.9 ??C)   Resp 18   Ht 5\' 11"  (1.803 m)   SpO2 96%   BMI 23.43 kg/m??  SpO2 Readings from Last 6 Encounters:   04/08/18 96%   05/10/14 95%            Intake/Output Summary (Last 24 hours) at 04/08/2018 0156  Last data filed at 04/07/2018 2353  Gross per 24 hour   Intake 1000 ml   Output ???   Net 1000 ml            Exam:     Physical Exam:    Gen: NOT ill appearing  HEENT:  Pink conjunctivae, PERRL, hearing intact to voice, moist mucous membranes  Neck:  Supple, without masses, thyroid non-tender  Resp:  No accessory muscle use, clear breath sounds without wheezes rales or rhonchi  Card:  No murmurs, normal S1, S2 without thrills, bruits or peripheral edema  Abd:  Soft, non-tender, non-distended, normoactive bowel sounds are present, no palpable organomegaly  Lymph:  No cervical adenopathy  Musc:  No cyanosis or clubbing  Skin:  No rashes or ulcers, skin turgor is good  Neuro:  Cranial nerves 3-12 are grossly intact, grip strength is 5/5 bilaterally, dorsi / plantarflexion strength is 5/5 bilaterally, follows commands appropriately  Psych:  Moderate insight. Pleasant        Labs:    Recent Labs     04/07/18  2249   WBC 24.3*   HGB 14.5   HCT 43.8   PLT 296     Recent Labs     04/07/18  2249   NA 138   K 4.4   CL 105   CO2 26   GLU 173*   BUN 23*   CREA 1.65*   CA 8.7   ALB 3.5   SGOT 24   ALT 20     No components found for: GLPOC  No results for input(s): PH, PCO2, PO2, HCO3, FIO2 in the last 72 hours.  No results for input(s): INR in the last 72 hours.    No lab exists for component: INREXT         Assessment/Plan:     SIRS -  2/2 UTI. NOT ill appearing   -f/u cultures   -continue IVF's   -continue IV ceftriaxone        UTI (urinary tract infection) (04/08/2018) - POA   -as above; IV antibiotics      AKI (acute kidney injury) (HCC) (04/08/2018) - ?2/2 mild IVVD ?2/2 urinary retention  -monitor with IVF's, if no improvement will need [place]   -check PVR   -hold metformin       HTN (hypertension) (04/08/2018) - pt was not aware of this diagnosis though on metoprolol. BP's WNL currently   -monitor   -resume metoprolol as BP's tolerate       DM (diabetes mellitus) (HCC) (04/08/2018)  -ISS   -BG checks AC TID and qHS   -resume metformin once creatinines stabilizes     Hypothyroidism   -continue levothyroxine     Gout   -continue allopurinol    Surrogate decision maker: Wife     Total time spent with patient: 23 Minutes                  Care Plan discussed with: Patient    Discussed:  Code Status, Care Plan and D/C Planning    Prophylaxis:  Hep SQ    Probable Disposition:  Home w/Family           ___________________________________________________    Attending Physician: Gaspar Bidding, MD

## 2018-04-08 NOTE — Consults (Signed)
Full H+P to follow

## 2018-04-08 NOTE — Progress Notes (Signed)
Bedside report given to Whitney RN.

## 2018-04-08 NOTE — Progress Notes (Signed)
Bedside and Verbal shift change report given to Christina Noss RN (oncoming nurse) by Alexandra Altano RN (offgoing nurse). Report included the following information SBAR and Kardex.

## 2018-04-08 NOTE — Progress Notes (Signed)
 [facility]: MED RECONCILIATION    Comments/Recommendations:   Addendum: Per daughter Metoprolol  tartrate 25 mg po BID    Medications added:      Bethanechol   Finasteride    B12   D3   Gingko   Sodium bicarbonate     Medications removed:     none    Medications adjusted:     Aspirin  changed to 81mg    Metoprolol  changed to daily    Information obtained from: Wife has a list    Significant PMH/Disease States:   Past Medical History:   Diagnosis Date   . CAD (coronary artery disease)    . Diabetes (HCC)    . Gout    . Hypertension    . Hypothyroid    . Kidney stones    . Pneumonia      Chief Complaint for this Admission:   Chief Complaint   Patient presents with   . Urinary Frequency     Allergies: Aspirin     Prior to Admission Medications:     Medication Documentation Review Audit       Reviewed by Geneva Ricka LABOR (Pharmacist) on 04/08/18 at 1241      Medication Sig Documenting Provider Last Dose Status Taking?   allopurinol (ZYLOPRIM) 100 mg tablet Take 100 mg by mouth daily. Other, Phys, MD 04/07/2018 Unknown time Active Yes   aspirin  delayed-release 81 mg tablet Take 81 mg by mouth daily. Provider, Historical 04/07/2018 Unknown time Active Yes   bethanechol (URECHOLINE) 5 mg tablet Take 5 mg by mouth Daily (before breakfast). Provider, Historical  Active Yes   cholecalciferol (VITAMIN D3) 1,000 unit tablet Take 2,000 Units by mouth daily. Provider, Historical  Active Yes   cyanocobalamin (VITAMIN B12) 500 mcg tablet Take 500 mcg by mouth daily. Provider, Historical  Active Yes   finasteride  (PROSCAR ) 5 mg tablet Take 5 mg by mouth daily. Provider, Historical  Active Yes   GINKGO BILOBA PO Take 60 mg by mouth daily. Provider, Historical  Active Yes   levothyroxine  (SYNTHROID ) 125 mcg tablet Take 125 mcg by mouth Daily (before breakfast). Provider, Historical 04/07/2018 Unknown time Active Yes   meclizine (ANTIVERT) 12.5 mg tablet Take 12.5 mg by mouth three (3) times daily as needed. Provider, Historical  Active Yes    metFORMIN (GLUCOPHAGE) 500 mg tablet Take 500 mg by mouth two (2) times daily (with meals). Other, Phys, MD 04/01/2018 Unknown time Active Yes   metoprolol  (LOPRESSOR ) 25 mg tablet Take 25 mg by mouth BID Other, Phys, MD 04/07/2018 Unknown time Active Yes   ondansetron  (ZOFRAN  ODT) 4 mg disintegrating tablet Take 1 Tab by mouth every eight (8) hours as needed for Nausea. Lannette Donnice SAUNDERS, MD  Active Yes   prochlorperazine (COMPAZINE) 10 mg tablet Take 5 mg by mouth every six (6) hours as needed. Provider, Historical  Active Yes   simvastatin (ZOCOR) 20 mg tablet Take 20 mg by mouth nightly. Provider, Historical  Active Yes   sodium bicarbonate  650 mg tablet Take 650 mg by mouth daily. Provider, Historical  Active Yes                  Ricka LABOR [place]   Contact: [phone]

## 2018-04-08 NOTE — Progress Notes (Signed)
Patient alert and oriented x 4.  Denies any complaints at present.  Lungs are clear bilaterally.  Respirations even and unlabored.  IV infusing through right wrist without difficulty.  No redness or swelling noted at  IV site.  Foley draining cloudy yellow urine.  No signs of distress noted.  Family at bedside.  Patient sitting up in chair.

## 2018-04-08 NOTE — Progress Notes (Signed)
PHYSICAL THERAPY EVALUATION/DISCHARGE  Patient: Brian Freeman (77 y.o. male)  Date: 04/08/2018  Primary Diagnosis: Sepsis (HCC) [A41.9]        Precautions:      ASSESSMENT :  Based on the objective data described below, the patient presents with admission due to SIRS with fever and freq urination due to UTI.  Pt with hx of DM/HTN/Gout/CAD.  Pt lives with his wife and currently drives and is independent with all functional mobility per wife.  Pt coming back from [place].  Supine to sit from gurney with Mod I.  Pt carrying own foley bag and walked into bathroom with distant supervision.  Toileted independently.  Placed in chair with call bell and family present.  RN stating that pt has been up ad lib in room prior to PT eval.  Pt cleared for same in room.  Further skilled acute physical therapy is not indicated at this time.     PLAN :  Discharge Recommendations: None  Further Equipment Recommendations for Discharge: none      SUBJECTIVE:   Patient stated ?I am doing fine.?    OBJECTIVE DATA SUMMARY:   HISTORY:    Past Medical History:   Diagnosis Date    CAD (coronary artery disease)     Diabetes (HCC)     Gout     Hypertension     Hypothyroid     Kidney stones     Pneumonia      Past Surgical History:   Procedure Laterality Date    HX CORONARY STENT PLACEMENT      HX HERNIA REPAIR       Prior Level of Function/Home Situation: see above  Personal factors and/or comorbidities impacting plan of care: see above and below    Home Situation  Home Environment: Private residence  # Steps to Enter: 2  Wheelchair Ramp: Yes  One/Two Story Residence: One story  Living Alone: No  Support Systems: Games developer, Child(ren)  Patient Expects to be Discharged to:: Private residence  Current DME Used/Available at Home: None  Tub or Shower Type: Tub/Shower combination    EXAMINATION/PRESENTATION/DECISION MAKING:   Critical Behavior:  Neurologic State: Alert  Orientation Level: Oriented X4  Cognition: Follows  commands  Safety/Judgement: Fall prevention, Insight into deficits  Hearing:  Auditory  Auditory Impairment: Hard of hearing, bilateral  Skin:  IV; foley  Edema: WNL  Range Of Motion:  AROM: Within functional limits                       Strength:    Strength: Within functional limits                    Tone & Sensation:   Tone: Normal              Sensation: Intact               Coordination:  Coordination: Within functional limits  Vision:      Functional Mobility:  Bed Mobility:  Rolling: Independent  Supine to Sit: Independent  Sit to Supine: Independent  Scooting: Independent  Transfers:  Sit to Stand: Independent  Stand to Sit: Independent                       Balance:   Sitting: Intact  Standing: Intact  Ambulation/Gait Training:  Distance (ft): 30 Feet (ft)  Assistive Device: Gait belt  Ambulation - Level of Assistance: Supervision  Base of Support: Narrowed                           Functional Measure:  Barthel Index:    Bathing: 0  Bladder: 0(foley)  Bowels: 10  Grooming: 5  Dressing: 5  Feeding: 10  Mobility: 10  Stairs: 0  Toilet Use: 10  Transfer (Bed to Chair and Back): 15  Total: 65/100       Percentage of impairment   0%   1-19%   20-39%   40-59%   60-79%   80-99%   100%   Barthel Score 0-100 100 99-80 79-60 59-40 20-39 1-19   0     The Barthel ADL Index: Guidelines  1. The index should be used as a record of what a patient does, not as a record of what a patient could do.  2. The main aim is to establish degree of independence from any help, physical or verbal, however minor and for whatever reason.  3. The need for supervision renders the patient not independent.  4. A patient's performance should be established using the best available evidence. Asking the patient, friends/relatives and nurses are the usual sources, but direct observation and common sense are also important. However direct testing is not needed.  5. Usually the patient's performance over the preceding 24-48 hours  is important, but occasionally longer periods will be relevant.  6. Middle categories imply that the patient supplies over 50 per cent of the effort.  7. Use of aids to be independent is allowed.    Clarisa KindredMahoney, F.l., Barthel, D.W. [phone](1965). Functional evaluation: the Barthel Index. Md State Med J (14)2.  Zenaida NieceVan der Sacate VillagePutten, J.J.M.F, [place] ComunidadHobart, Ian MalkinJ.C., Margret ChanceFreeman, J.A., [place], [place].J. (1999). Measuring the change indisability after inpatient rehabilitation; comparison of the responsiveness of the Barthel Index and Functional Independence Measure. Journal of Neurology, Neurosurgery, and Psychiatry, 66(4), (252) [phone].  Dawson BillsVan Exel, N.J.A, [place] op K[place],  W.J.M, & Koopmanschap, M.A. (2004.) Assessment of post-stroke quality of life in cost-effectiveness studies: The usefulness of the Barthel Index and the EuroQoL-5D. Quality of Life Research, 5513, [phone]           Physical Therapy Evaluation Charge Determination   History Examination Presentation Decision-Making   LOW Complexity : Zero comorbidities / personal factors that will impact the outcome / POC LOW Complexity : 1-2 Standardized tests and measures addressing body structure, function, activity limitation and / or participation in recreation  LOW Complexity : Stable, uncomplicated  Other outcome measures barthel  LOW       Based on the above components, the patient evaluation is determined to be of the following complexity level: LOW     Pain:  Pain Scale 1: Numeric (0 - 10)  Pain Intensity 1: 0     Activity Tolerance:   good  Please refer to the flowsheet for vital signs taken during this treatment.  After treatment:   ?   Patient left in no apparent distress sitting up in chair  ?   Patient left in no apparent distress in bed  ?   Call bell left within reach  ?   Nursing notified  ?   Caregiver present  ?   Bed alarm activated    COMMUNICATION/EDUCATION:   Communication/Collaboration:  ?   Fall prevention education was provided and the patient/caregiver indicated understanding.  ?    Patient/family have participated as able and agree with findings and recommendations.  ?  Patient is unable to participate in plan of care at this time.  Findings and recommendations were discussed with: Registered Nurse    Thank you for this referral.  Aldona Bar, PT   Time Calculation: 25 mins

## 2018-04-08 NOTE — ED Notes (Signed)
Notified 4th floor charge nurse of room assignment, nurse to call back for report     Relayed message to Carlos, RN     Jessica, RN   ED Charge Nurse

## 2018-04-08 NOTE — Other (Signed)
TRANSFER - OUT REPORT:    Verbal report given to Alexandra(name) on Brian Freeman  being transferred to 4th floor(unit) for routine progression of care       Report consisted of patient???s Situation, Background, Assessment and   Recommendations(SBAR).     Information from the following report(s) SBAR, Kardex and ED Summary was reviewed with the receiving nurse.    Lines:       Opportunity for questions and clarification was provided.      Patient transported with:   Registered Nurse

## 2018-04-08 NOTE — Progress Notes (Signed)
Telepone report given to Alexandra Altano RN (oncoming nurse) by Carlos RN (offgoing nurse). Report included the following information SBAR, Kardex, ED Summary, MAR and Recent Results.

## 2018-04-08 NOTE — Progress Notes (Signed)
Patient alert and oriented x 4.  Denies any complaints at present.  Lungs are clear bilaterally.  Respirations even and unlabored.  IV infusing through right wrist without difficulty.  No redness or swelling noted at  IV site.  Foley draining cloudy yellow urine.  No signs of distress noted.  Family at bedside.  Patient sitting up in chair.

## 2018-04-08 NOTE — Progress Notes (Addendum)
[facility] [address], [place], [place] [id number]  [phone]    Medical Progress Note      NAME: Nolon NationsDallas Mathisen   DOB:  09/11/1941  MRM:  [id number]    Date/Time: 04/08/2018       Subjective:     Chief Complaint:  Patient was seen and examined by me.  Chart reviewed.  Dysuria improving.  No fevers, chills       Objective:       Vitals:       Last 24hrs VS reviewed since prior progress note. Most recent are:    Visit Vitals  BP 137/70 (BP 1 Location: Left arm, BP Patient Position: Supine)   Pulse (!) 102   Temp 98 ??F (36.7 ??C)   Resp 14   Ht 5\' 11"  (1.803 m)   SpO2 98%   BMI 23.43 kg/m??     SpO2 Readings from Last 6 Encounters:   04/08/18 98%   05/10/14 95%            Intake/Output Summary (Last 24 hours) at 04/08/2018 1123  Last data filed at 04/08/2018 1010  Gross per 24 hour   Intake 1360 ml   Output 1350 ml   Net 10 ml        Exam:     Physical Exam:    Gen:  Elderly, frail, NAD  HEENT:  Pink conjunctivae, PERRL, hearing intact to voice, moist mucous membranes  Neck:  Supple, without masses, thyroid non-tender  Resp:  No accessory muscle use, clear breath sounds without wheezes rales or rhonchi  Card:  No murmurs, normal S1, S2 without thrills, bruits or peripheral edema  Abd:  Soft, non-tender, non-distended, normoactive bowel sounds are present  Musc:  No cyanosis or clubbing  Skin:  No rashes   Neuro:  Cranial nerves 3-12 are grossly intact, follows commands appropriately  Psych:  fair insight, oriented to person, place and time, alert    Medications Reviewed: (see below)    Lab Data Reviewed: (see below)    ______________________________________________________________________    Medications:     Current Facility-Administered Medications   Medication Dose Route Frequency   ??? sodium chloride (NS) flush 5-40 mL  5-40 mL IntraVENous Q8H   ??? sodium chloride (NS) flush 5-40 mL  5-40 mL IntraVENous PRN   ??? acetaminophen (TYLENOL) tablet 650 mg  650 mg Oral Q4H PRN    ??? naloxone (NARCAN) injection 0.4 mg  0.4 mg IntraVENous PRN   ??? ondansetron (ZOFRAN) injection 4 mg  4 mg IntraVENous Q4H PRN   ??? heparin (porcine) injection 5,000 Units  5,000 Units SubCUTAneous Q8H   ??? 0.9% sodium chloride infusion  75 mL/hr IntraVENous CONTINUOUS   ??? glucose chewable tablet 16 g  4 Tab Oral PRN   ??? dextrose (D50) infusion 12.5-25 g  25-50 mL IntraVENous PRN   ??? glucagon (GLUCAGEN) injection 1 mg  1 mg IntraMUSCular PRN   ??? insulin lispro (HUMALOG) injection   SubCUTAneous AC&HS   ??? cefTRIAXone (ROCEPHIN) 2 g in 0.9% sodium chloride (MBP/ADV) 50 mL  2 g IntraVENous Q24H          Lab Review:     Recent Labs     04/07/18  2249   WBC 24.3*   HGB 14.5   HCT 43.8   PLT 296     Recent Labs     04/07/18  2249   NA 138   K 4.4  CL 105   CO2 26   GLU 173*   BUN 23*   CREA 1.65*   CA 8.7   ALB 3.5   TBILI 0.8   SGOT 24   ALT 20     Lab Results   Component Value Date/Time    Glucose (POC) 126 (H) 04/08/2018 06:54 AM          Assessment / Plan:     Principal Problem:    77 yo hx of HTN, DM, CAD, hypothyroid, presented w/ sepsis, UTI, AKI    1) Sepsis/UTI: sepsis POA, now improving.  Likely due to urine retention.  Will continue to monitor cultures.  Will increase IV CTX to cover possible bacteremia    2) AKI: likely a combination of urine retention and pre-renal.  Will check renal U/S to r/o obstruction.  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  Has a Insurance underwriter in [place].  Will place foley today    4) HTN: awaiting med rec.  Can resume home BB    5) DM type 2: A1C pending.  Holding metformin due to AKI. Cont SSI    6) Hypothyroid: cont synthroid after med rec    Total time spent with patient: 35 min                  Care Plan discussed with: Patient, nursing    Discussed:  Care Plan    Prophylaxis:  Hep SQ    Disposition:  Home w/Family           ___________________________________________________    Attending Physician: Dayna Barker, MD

## 2018-04-08 NOTE — H&P (Signed)
[facility]  [address] [place], [place]  [address]  [phone]  Admission History and Physical      NAME:  Brian Freeman   DOB:   Jun 04, 1941   MRN:  [id number]     PCP:  Sol Blazing, MD     Date/Time:  04/08/2018         Subjective:     CHIEF COMPLAINT: "I've had to pee every hour"     HISTORY OF PRESENT ILLNESS:     Mr. Lopezgarcia is a 77 y.o.  Caucasian male with PMH of CAD, DM, HTN, hypothyroidism, gout admitted for SIRS and UTI. Per pt, over the last 2-3 days has had increased urinary frequency with fevers/chills. No other complaints currently. Denies N/V/D.     Past Medical History:   Diagnosis Date   ??? CAD (coronary artery disease)    ??? Diabetes (HCC)    ??? Gout    ??? Hypertension    ??? Hypothyroid    ??? Kidney stones    ??? Pneumonia         Past Surgical History:   Procedure Laterality Date   ??? HX CORONARY STENT PLACEMENT     ??? HX HERNIA REPAIR         Social History     Tobacco Use   ??? Smoking status: Never Smoker   Substance Use Topics   ??? Alcohol use: No      FH:   HTN     Allergies   Allergen Reactions   ??? Aspirin Unknown (comments)     Upset stomach        Prior to Admission medications    Medication Sig Start Date End Date Taking? Authorizing Provider   levothyroxine (SYNTHROID) 100 mcg tablet Take 100 mcg by mouth Daily (before breakfast).   Yes Other, Phys, MD   metFORMIN (GLUCOPHAGE) 500 mg tablet Take  by mouth two (2) times daily (with meals).   Yes Other, Phys, MD   aspirin delayed-release 325 mg tablet Take  by mouth every six (6) hours as needed for Pain.   Yes Other, Phys, MD   allopurinol (ZYLOPRIM) 100 mg tablet Take  by mouth daily.   Yes Other, Phys, MD   metoprolol (LOPRESSOR) 25 mg tablet Take  by mouth two (2) times a day.    Other, Phys, MD   simvastatin (ZOCOR) 40 mg tablet Take 20 mg by mouth nightly.    Other, Phys, MD   ondansetron (ZOFRAN ODT) 4 mg disintegrating tablet Take 1 Tab by mouth  every eight (8) hours as needed for Nausea. 05/10/14   Jefm Bryant, MD         Review of Systems:  (bold if positive, if negative)    Gen:  Eyes:  ENT:  CVS:  Pulm:  GI:    GU:    MS:  Skin:  Psych:  Endo:    Hem:  Renal:    Neuro:     Fevers/chills, urinary frequency        Objective:      VITALS:    Vital signs reviewed; most recent are:    Visit Vitals  BP 133/73 (BP 1 Location: Right arm, BP Patient Position: At rest)   Pulse 91   Temp 98.4 ??F (36.9 ??C)   Resp 18   Ht 5\' 11"  (1.803 m)   SpO2 96%   BMI 23.43 kg/m??     SpO2 Readings  from Last 6 Encounters:   04/08/18 96%   05/10/14 95%            Intake/Output Summary (Last 24 hours) at 04/08/2018 0156  Last data filed at 04/07/2018 2353  Gross per 24 hour   Intake 1000 ml   Output ???   Net 1000 ml            Exam:     Physical Exam:    Gen: NOT ill appearing  HEENT:  Pink conjunctivae, PERRL, hearing intact to voice, moist mucous membranes  Neck:  Supple, without masses, thyroid non-tender  Resp:  No accessory muscle use, clear breath sounds without wheezes rales or rhonchi  Card:  No murmurs, normal S1, S2 without thrills, bruits or peripheral edema  Abd:  Soft, non-tender, non-distended, normoactive bowel sounds are present, no palpable organomegaly  Lymph:  No cervical adenopathy  Musc:  No cyanosis or clubbing  Skin:  No rashes or ulcers, skin turgor is good  Neuro:  Cranial nerves 3-12 are grossly intact, grip strength is 5/5 bilaterally, dorsi / plantarflexion strength is 5/5 bilaterally, follows commands appropriately  Psych:  Moderate insight. Pleasant        Labs:    Recent Labs     04/07/18  2249   WBC 24.3*   HGB 14.5   HCT 43.8   PLT 296     Recent Labs     04/07/18  2249   NA 138   K 4.4   CL 105   CO2 26   GLU 173*   BUN 23*   CREA 1.65*   CA 8.7   ALB 3.5   SGOT 24   ALT 20     No components found for: GLPOC  No results for input(s): PH, PCO2, PO2, HCO3, FIO2 in the last 72 hours.  No results for input(s): INR in the last 72 hours.     No lab exists for component: INREXT         Assessment/Plan:     SIRS - 2/2 UTI. NOT ill appearing   -f/u cultures   -continue IVF's   -continue IV ceftriaxone        UTI (urinary tract infection) (04/08/2018) - POA   -as above; IV antibiotics      AKI (acute kidney injury) (HCC) (04/08/2018) - ?2/2 mild IVVD ?2/2 urinary retention  -monitor with IVF's, if no improvement will need US   -check PVR   -hold metformin       HTN (hypertension) (04/08/2018) - pt was not aware of this diagnosis though on metoprolol. BP's WNL currently   -monitor   -resume metoprolol as BP's tolerate       DM (diabetes mellitus) (HCC) (04/08/2018)  -ISS   -BG checks AC TID and qHS   -resume metformin once creatinines stabilizes     Hypothyroidism   -continue levothyroxine     Gout   -continue allopurinol    Surrogate decision maker: Wife     Total time spent with patient: 6270 Minutes                  Care Plan discussed with: Patient    Discussed:  Code Status, Care Plan and D/C Planning    Prophylaxis:  Hep SQ    Probable Disposition:  Home w/Family           ___________________________________________________    Attending Physician: Gaspar BiddingLarissa N Celie Desrochers, MD

## 2018-04-08 NOTE — Progress Notes (Addendum)
[facility]: MED RECONCILIATION    Comments/Recommendations:   Addendum: Per daughter Metoprolol tartrate 25 mg po BID    Medications added:     ?? Bethanechol  ?? Finasteride  ?? B12  ?? D3  ?? Gingko  ?? Sodium bicarbonate    Medications removed:    ?? none    Medications adjusted:    ?? Aspirin changed to 81mg  ?? Metoprolol changed to daily    Information obtained from: Wife has a list    Significant PMH/Disease States:   Past Medical History:   Diagnosis Date   ??? CAD (coronary artery disease)    ??? Diabetes (HCC)    ??? Gout    ??? Hypertension    ??? Hypothyroid    ??? Kidney stones    ??? Pneumonia      Chief Complaint for this Admission:   Chief Complaint   Patient presents with   ??? Urinary Frequency     Allergies: Aspirin    Prior to Admission Medications:     Medication Documentation Review Audit       Reviewed by Bond, Rita A (Pharmacist) on 04/08/18 at 1241      Medication Sig Documenting Provider Last Dose Status Taking?   allopurinol (ZYLOPRIM) 100 mg tablet Take 100 mg by mouth daily. Other, Phys, MD 04/07/2018 Unknown time Active Yes   aspirin delayed-release 81 mg tablet Take 81 mg by mouth daily. Provider, Historical 04/07/2018 Unknown time Active Yes   bethanechol (URECHOLINE) 5 mg tablet Take 5 mg by mouth Daily (before breakfast). Provider, Historical  Active Yes   cholecalciferol (VITAMIN D3) 1,000 unit tablet Take 2,000 Units by mouth daily. Provider, Historical  Active Yes   cyanocobalamin (VITAMIN B12) 500 mcg tablet Take 500 mcg by mouth daily. Provider, Historical  Active Yes   finasteride (PROSCAR) 5 mg tablet Take 5 mg by mouth daily. Provider, Historical  Active Yes   GINKGO BILOBA PO Take 60 mg by mouth daily. Provider, Historical  Active Yes   levothyroxine (SYNTHROID) 125 mcg tablet Take 125 mcg by mouth Daily (before breakfast). Provider, Historical 04/07/2018 Unknown time Active Yes   meclizine (ANTIVERT) 12.5 mg tablet Take 12.5 mg by mouth three (3) times daily as needed. Provider, Historical  Active Yes    metFORMIN (GLUCOPHAGE) 500 mg tablet Take 500 mg by mouth two (2) times daily (with meals). Other, Phys, MD 04/01/2018 Unknown time Active Yes   metoprolol (LOPRESSOR) 25 mg tablet Take 25 mg by mouth BID Other, Phys, MD 04/07/2018 Unknown time Active Yes   ondansetron (ZOFRAN ODT) 4 mg disintegrating tablet Take 1 Tab by mouth every eight (8) hours as needed for Nausea. Bartholomew, Matthew R, MD  Active Yes   prochlorperazine (COMPAZINE) 10 mg tablet Take 5 mg by mouth every six (6) hours as needed. Provider, Historical  Active Yes   simvastatin (ZOCOR) 20 mg tablet Take 20 mg by mouth nightly. Provider, Historical  Active Yes   sodium bicarbonate 650 mg tablet Take 650 mg by mouth daily. Provider, Historical  Active Yes                  Rita A Bond   Contact: [phone]

## 2018-04-08 NOTE — Progress Notes (Signed)
PHYSICAL THERAPY EVALUATION/DISCHARGE  Patient: Brian Freeman (77 y.o. male)  Date: 04/08/2018  Primary Diagnosis: Sepsis (HCC) [A41.9]        Precautions:      ASSESSMENT :  Based on the objective data described below, the patient presents with admission due to SIRS with fever and freq urination due to UTI.  Pt with hx of DM/HTN/Gout/CAD.  Pt lives with his wife and currently drives and is independent with all functional mobility per wife.  Pt coming back from [place].  Supine to sit from gurney with Mod I.  Pt carrying own foley bag and walked into bathroom with distant supervision.  Toileted independently.  Placed in chair with call bell and family present.  RN stating that pt has been up ad lib in room prior to PT eval.  Pt cleared for same in room.  Further skilled acute physical therapy is not indicated at this time.     PLAN :  Discharge Recommendations: None  Further Equipment Recommendations for Discharge: none      SUBJECTIVE:   Patient stated ?I am doing fine.?    OBJECTIVE DATA SUMMARY:   HISTORY:    Past Medical History:   Diagnosis Date    CAD (coronary artery disease)     Diabetes (HCC)     Gout     Hypertension     Hypothyroid     Kidney stones     Pneumonia      Past Surgical History:   Procedure Laterality Date    HX CORONARY STENT PLACEMENT      HX HERNIA REPAIR       Prior Level of Function/Home Situation: see above  Personal factors and/or comorbidities impacting plan of care: see above and below    Home Situation  Home Environment: Private residence  # Steps to Enter: 2  Wheelchair Ramp: Yes  One/Two Story Residence: One story  Living Alone: No  Support Systems: Games developer, Child(ren)  Patient Expects to be Discharged to:: Private residence  Current DME Used/Available at Home: None  Tub or Shower Type: Tub/Shower combination  EXAMINATION/PRESENTATION/DECISION MAKING:   Critical Behavior:  Neurologic State: Alert  Orientation Level: Oriented X4  Cognition: Follows commands   Safety/Judgement: Fall prevention, Insight into deficits  Hearing:  Auditory  Auditory Impairment: Hard of hearing, bilateral  Skin:  IV; foley  Edema: WNL  Range Of Motion:  AROM: Within functional limits                       Strength:    Strength: Within functional limits                    Tone & Sensation:   Tone: Normal              Sensation: Intact               Coordination:  Coordination: Within functional limits  Vision:      Functional Mobility:  Bed Mobility:  Rolling: Independent  Supine to Sit: Independent  Sit to Supine: Independent  Scooting: Independent  Transfers:  Sit to Stand: Independent  Stand to Sit: Independent                       Balance:   Sitting: Intact  Standing: Intact  Ambulation/Gait Training:  Distance (ft): 30 Feet (ft)  Assistive Device: Gait belt  Ambulation - Level of Assistance: Supervision  Base of Support: Narrowed                           Functional Measure:  Barthel Index:  Bathing: 0  Bladder: 0(foley)  Bowels: 10  Grooming: 5  Dressing: 5  Feeding: 10  Mobility: 10  Stairs: 0  Toilet Use: 10  Transfer (Bed to Chair and Back): 15  Total: 65/100      Percentage of impairment   0%   1-19%   20-39%   40-59%   60-79%   80-99%   100%   Barthel Score 0-100 100 99-80 79-60 59-40 20-39 1-19   0     The Barthel ADL Index: Guidelines  1. The index should be used as a record of what a patient does, not as a record of what a patient could do.  2. The main aim is to establish degree of independence from any help, physical or verbal, however minor and for whatever reason.  3. The need for supervision renders the patient not independent.  4. A patient's performance should be established using the best available evidence. Asking the patient, friends/relatives and nurses are the usual sources, but direct observation and common sense are also important. However direct testing is not needed.  5. Usually the patient's performance over the preceding 24-48 hours is  important, but occasionally longer periods will be relevant.  6. Middle categories imply that the patient supplies over 50 per cent of the effort.  7. Use of aids to be independent is allowed.    Clarisa Kindred., Barthel, D.W. [id number]). Functional evaluation: the Barthel Index. Md State Med J (14)2.  Zenaida Niece der Waterman, J.J.M.F, [place], Ian Malkin., Margret Chance., [place], [place]. (1999). Measuring the change indisability after inpatient rehabilitation; comparison of the responsiveness of the Barthel Index and Functional Independence Measure. Journal of Neurology, Neurosurgery, and Psychiatry, 66(4), [phone].  Dawson Bills, N.J.A, [place],  W.J.M, & [place], M.A. (2004.) Assessment of post-stroke quality of life in cost-effectiveness studies: The usefulness of the Barthel Index and the EuroQoL-5D. Quality of Life Research, 52, 098-11           Physical Therapy Evaluation Charge Determination   History Examination Presentation Decision-Making   LOW Complexity : Zero comorbidities / personal factors that will impact the outcome / POC LOW Complexity : 1-2 Standardized tests and measures addressing body structure, function, activity limitation and / or participation in recreation  LOW Complexity : Stable, uncomplicated  Other outcome measures barthel  LOW       Based on the above components, the patient evaluation is determined to be of the following complexity level: LOW     Pain:  Pain Scale 1: Numeric (0 - 10)  Pain Intensity 1: 0     Activity Tolerance:   good  Please refer to the flowsheet for vital signs taken during this treatment.  After treatment:   ?   Patient left in no apparent distress sitting up in chair  ?   Patient left in no apparent distress in bed  ?   Call bell left within reach  ?   Nursing notified  ?   Caregiver present  ?   Bed alarm activated    COMMUNICATION/EDUCATION:   Communication/Collaboration:  ?   Fall prevention education was provided and the patient/caregiver indicated understanding.   ?   Patient/family have participated as able and agree with findings and recommendations.  ?   Patient is  unable to participate in plan of care at this time.  Findings and recommendations were discussed with: Registered Nurse    Thank you for this referral.  Aldona BarMaria D Scalise, PT   Time Calculation: 25 mins

## 2018-04-09 DIAGNOSIS — I10 Essential (primary) hypertension: Secondary | ICD-10-CM | POA: Diagnosis not present

## 2018-04-09 DIAGNOSIS — Z7984 Long term (current) use of oral hypoglycemic drugs: Secondary | ICD-10-CM | POA: Diagnosis not present

## 2018-04-09 DIAGNOSIS — N390 Urinary tract infection, site not specified: Secondary | ICD-10-CM | POA: Diagnosis not present

## 2018-04-09 DIAGNOSIS — N179 Acute kidney failure, unspecified: Secondary | ICD-10-CM | POA: Diagnosis not present

## 2018-04-09 DIAGNOSIS — R339 Retention of urine, unspecified: Secondary | ICD-10-CM | POA: Insufficient documentation

## 2018-04-09 DIAGNOSIS — R338 Other retention of urine: Secondary | ICD-10-CM | POA: Diagnosis not present

## 2018-04-09 DIAGNOSIS — I2510 Atherosclerotic heart disease of native coronary artery without angina pectoris: Secondary | ICD-10-CM | POA: Diagnosis not present

## 2018-04-09 DIAGNOSIS — A419 Sepsis, unspecified organism: Secondary | ICD-10-CM | POA: Diagnosis not present

## 2018-04-09 DIAGNOSIS — E119 Type 2 diabetes mellitus without complications: Secondary | ICD-10-CM | POA: Diagnosis not present

## 2018-04-09 DIAGNOSIS — E039 Hypothyroidism, unspecified: Secondary | ICD-10-CM | POA: Diagnosis not present

## 2018-04-09 HISTORY — DX: Other retention of urine: R33.8

## 2018-04-09 LAB — METABOLIC PANEL, BASIC
Anion gap: 5 mmol/L (ref 5–15)
BUN/Creatinine ratio: 13 (ref 12–20)
BUN: 19 MG/DL (ref 6–20)
CO2: 28 mmol/L (ref 21–32)
Calcium: 8.8 MG/DL (ref 8.5–10.1)
Chloride: 107 mmol/L (ref 97–108)
Creatinine: 1.44 MG/DL — ABNORMAL HIGH (ref 0.70–1.30)
GFR est AA: 58 mL/min/{1.73_m2} — ABNORMAL LOW (ref >60)
GFR est non-AA: 47 mL/min/{1.73_m2} — ABNORMAL LOW (ref >60)
Glucose: 108 mg/dL — ABNORMAL HIGH (ref 65–100)
Potassium: 5.2 mmol/L — ABNORMAL HIGH (ref 3.5–5.1)
Sodium: 140 mmol/L (ref 136–145)

## 2018-04-09 LAB — CBC WITH AUTOMATED DIFF
ABS. BASOPHILS: 0.1 10*3/uL (ref 0.0–0.1)
ABS. EOSINOPHILS: 0.1 10*3/uL (ref 0.0–0.4)
ABS. IMM. GRANS.: 0.1 10*3/uL — ABNORMAL HIGH (ref 0.00–0.04)
ABS. LYMPHOCYTES: 1.6 10*3/uL (ref 0.8–3.5)
ABS. MONOCYTES: 1.5 10*3/uL — ABNORMAL HIGH (ref 0.0–1.0)
ABS. NEUTROPHILS: 10 10*3/uL — ABNORMAL HIGH (ref 1.8–8.0)
ABSOLUTE NRBC: 0 10*3/uL (ref 0.00–0.01)
BASOPHILS: 1 % (ref 0–1)
EOSINOPHILS: 1 % (ref 0–7)
HCT: 46.6 % (ref 36.6–50.3)
HGB: 14.9 g/dL (ref 12.1–17.0)
IMMATURE GRANULOCYTES: 0 % (ref 0.0–0.5)
LYMPHOCYTES: 12 % (ref 12–49)
MCH: 29.1 PG (ref 26.0–34.0)
MCHC: 32 g/dL (ref 30.0–36.5)
MCV: 91 FL (ref 80.0–99.0)
MONOCYTES: 11 % (ref 5–13)
MPV: 10.5 FL (ref 8.9–12.9)
NEUTROPHILS: 75 % (ref 32–75)
NRBC: 0 PER 100 WBC
PLATELET: 261 10*3/uL (ref 150–400)
RBC: 5.12 M/uL (ref 4.10–5.70)
RDW: 14.3 % (ref 11.5–14.5)
WBC: 13.4 10*3/uL — ABNORMAL HIGH (ref 4.1–11.1)

## 2018-04-09 LAB — GLUCOSE, POC
Glucose (POC): 116 mg/dL — ABNORMAL HIGH (ref 65–100)
Glucose (POC): 106 mg/dL — ABNORMAL HIGH (ref 65–100)
Glucose (POC): 130 mg/dL — ABNORMAL HIGH (ref 65–100)

## 2018-04-09 LAB — PHOSPHORUS
Phosphorus: 2.8 MG/DL (ref 2.6–4.7)
Phosphorus: 2.8 MG/DL (ref 2.6–4.7)

## 2018-04-09 LAB — MAGNESIUM
Magnesium: 1.8 mg/dL (ref 1.6–2.4)
Magnesium: 1.8 mg/dL (ref 1.6–2.4)

## 2018-04-09 LAB — CBC WITH AUTO DIFFERENTIAL
Basophils %: 1 % (ref 0–1)
Basophils Absolute: 0.1 10*3/uL (ref 0.0–0.1)
Eosinophils %: 1 % (ref 0–7)
Eosinophils Absolute: 0.1 10*3/uL (ref 0.0–0.4)
Granulocyte Absolute Count: 0.1 10*3/uL — ABNORMAL HIGH (ref 0.00–0.04)
Hematocrit: 46.6 % (ref 36.6–50.3)
Hemoglobin: 14.9 g/dL (ref 12.1–17.0)
Immature Granulocytes: 0 % (ref 0.0–0.5)
Lymphocytes %: 12 % (ref 12–49)
Lymphocytes Absolute: 1.6 10*3/uL (ref 0.8–3.5)
MCH: 29.1 PG (ref 26.0–34.0)
MCHC: 32 g/dL (ref 30.0–36.5)
MCV: 91 FL (ref 80.0–99.0)
MPV: 10.5 FL (ref 8.9–12.9)
Monocytes %: 11 % (ref 5–13)
Monocytes Absolute: 1.5 10*3/uL — ABNORMAL HIGH (ref 0.0–1.0)
NRBC Absolute: 0 10*3/uL (ref 0.00–0.01)
Neutrophils %: 75 % (ref 32–75)
Neutrophils Absolute: 10 10*3/uL — ABNORMAL HIGH (ref 1.8–8.0)
Nucleated RBCs: 0 PER 100 WBC
Platelets: 261 10*3/uL (ref 150–400)
RBC: 5.12 M/uL (ref 4.10–5.70)
RDW: 14.3 % (ref 11.5–14.5)
WBC: 13.4 10*3/uL — ABNORMAL HIGH (ref 4.1–11.1)

## 2018-04-09 LAB — POCT GLUCOSE
POC Glucose: 116 mg/dL — ABNORMAL HIGH (ref 65–100)
POC Glucose: 106 mg/dL — ABNORMAL HIGH (ref 65–100)
POC Glucose: 130 mg/dL — ABNORMAL HIGH (ref 65–100)

## 2018-04-09 LAB — BASIC METABOLIC PANEL
Anion Gap: 5 mmol/L (ref 5–15)
BUN: 19 MG/DL (ref 6–20)
Bun/Cre Ratio: 13 (ref 12–20)
CO2: 28 mmol/L (ref 21–32)
Calcium: 8.8 MG/DL (ref 8.5–10.1)
Chloride: 107 mmol/L (ref 97–108)
Creatinine: 1.44 MG/DL — ABNORMAL HIGH (ref 0.70–1.30)
EGFR IF NonAfrican American: 47 mL/min/{1.73_m2} — ABNORMAL LOW (ref >60)
GFR African American: 58 mL/min/{1.73_m2} — ABNORMAL LOW (ref >60)
Glucose: 108 mg/dL — ABNORMAL HIGH (ref 65–100)
Potassium: 5.2 mmol/L — ABNORMAL HIGH (ref 3.5–5.1)
Sodium: 140 mmol/L (ref 136–145)

## 2018-04-09 MED ORDER — ALLOPURINOL 100 MG TAB
100 mg | Freq: Every day | ORAL | Status: DC
Start: 2018-04-09 — End: 2018-04-09
  Administered 2018-04-09: 12:00:00 via ORAL

## 2018-04-09 MED ORDER — LEVOTHYROXINE 25 MCG TAB
25 mcg | Freq: Every day | ORAL | Status: DC
Start: 2018-04-09 — End: 2018-04-09
  Administered 2018-04-09: 12:00:00 via ORAL

## 2018-04-09 MED ORDER — ASPIRIN 81 MG TAB, DELAYED RELEASE
81 mg | Freq: Every day | ORAL | Status: DC
Start: 2018-04-09 — End: 2018-04-09
  Administered 2018-04-09: 12:00:00 via ORAL

## 2018-04-09 MED ORDER — METOPROLOL TARTRATE 25 MG TAB
25 mg | Freq: Two times a day (BID) | ORAL | Status: DC
Start: 2018-04-09 — End: 2018-04-09
  Administered 2018-04-09: 12:00:00 via ORAL

## 2018-04-09 MED ORDER — SIMVASTATIN 20 MG TAB
20 mg | Freq: Every evening | ORAL | Status: DC
Start: 2018-04-09 — End: 2018-04-09

## 2018-04-09 MED ORDER — FINASTERIDE 5 MG TAB
5 mg | Freq: Every day | ORAL | Status: DC
Start: 2018-04-09 — End: 2018-04-09
  Administered 2018-04-09: 12:00:00 via ORAL

## 2018-04-09 MED ORDER — CEFDINIR 300 MG CAP
300 mg | ORAL_CAPSULE | Freq: Two times a day (BID) | ORAL | 0 refills | Status: AC
Start: 2018-04-09 — End: 2018-04-16

## 2018-04-09 MED ORDER — BETHANECHOL CHLORIDE 10 MG TAB
10 mg | Freq: Every day | ORAL | Status: DC
Start: 2018-04-09 — End: 2018-04-09
  Administered 2018-04-09: 15:00:00 via ORAL

## 2018-04-09 MED ORDER — SODIUM BICARBONATE 650 MG TAB
650 mg | Freq: Every day | ORAL | Status: DC
Start: 2018-04-09 — End: 2018-04-09
  Administered 2018-04-09: 12:00:00 via ORAL

## 2018-04-09 MED FILL — ALLOPURINOL 100 MG TAB: 100 mg | ORAL | Qty: 1

## 2018-04-09 MED FILL — BETHANECHOL CHLORIDE 10 MG TAB: 10 mg | ORAL | Qty: 1

## 2018-04-09 MED FILL — METOPROLOL TARTRATE 25 MG TAB: 25 mg | ORAL | Qty: 1

## 2018-04-09 MED FILL — LEVOTHYROXINE 25 MCG TAB: 25 mcg | ORAL | Qty: 1

## 2018-04-09 MED FILL — CEFTRIAXONE 2 GRAM SOLUTION FOR INJECTION: 2 gram | INTRAMUSCULAR | Qty: 2

## 2018-04-09 MED FILL — HEPARIN (PORCINE) 5,000 UNIT/ML IJ SOLN: 5000 unit/mL | INTRAMUSCULAR | Qty: 1

## 2018-04-09 MED FILL — ACETAMINOPHEN 325 MG TABLET: 325 mg | ORAL | Qty: 2

## 2018-04-09 MED FILL — ASPIRIN 81 MG TAB, DELAYED RELEASE: 81 mg | ORAL | Qty: 1

## 2018-04-09 MED FILL — SODIUM BICARBONATE 650 MG TAB: 650 mg | ORAL | Qty: 1

## 2018-04-09 MED FILL — FINASTERIDE 5 MG TAB: 5 mg | ORAL | Qty: 1

## 2018-04-09 NOTE — Discharge Summary (Signed)
[facility]  [address] [place], [place] [address]  [phone]    Physician Discharge Summary     Patient ID:  Brian Freeman  [id number]  77 y.o.  1941-04-04    Admit date: 04/07/2018    Discharge date and time: 04/09/2018    Admission Diagnoses: Sepsis (HCC) [A41.9]    Discharge Diagnoses:  Principal Diagnosis UTI (urinary tract infection)                                            Principal Problem:    UTI (urinary tract infection) (04/08/2018)    Active Problems:    SIRS (systemic inflammatory response syndrome) (HCC) (04/08/2018)      AKI (acute kidney injury) (HCC) (04/08/2018)      HTN (hypertension) (04/08/2018)      DM (diabetes mellitus) (HCC) (04/08/2018)      Acquired hypothyroidism (04/08/2018)      Gout (04/08/2018)      Acute retention of urine (04/09/2018)           Hospital Course:     77 yo hx of HTN, DM, CAD, hypothyroid, presented w/ sepsis, UTI, AKI  ??  1) Sepsis/UTI: sepsis POA, now much improved.  Likely due to urine retention.  UCx with gram neg rods.  Patient was on IV CTX, will complete a course of omnicef  ??  2) AKI: improving, likely a combination of urine retention and pre-renal.  Renal U/S neg for obstruction.  Patient will f/u with his nephrologist in [place]  ??  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  Has a Insurance underwriter in [place].  Cont foley until patient can f/u with his Urologist   ??  4) HTN: cont metoprolol  ??  5) DM type 2: cont metformin since renal function is improving  ??  6) Hypothyroid: cont synthroid    PCP: Other, Phys, MD     Consults: none    Significant Diagnostic Studies: foley    Discharge Exam:  Physical Exam:    Gen: Elderly, thin, frail, NAD  HEENT:  Pink conjunctivae, PERRL, hearing intact to voice, moist mucous membranes  Neck: Supple, without masses, thyroid non-tender  Resp: No accessory muscle use, clear breath sounds without wheezes rales or rhonchi  Card: No murmurs, normal S1, S2 without thrills, bruits or peripheral edema  Abd:   Soft, non-tender, non-distended, normoactive bowel sounds are present  Lymph:  No cervical or inguinal adenopathy  Musc: No cyanosis or clubbing  Skin: No rashes or ulcers, skin turgor is good  Neuro:  Cranial nerves are grossly intact, no focal motor weakness, follows commands appropriately  Psych:  Good insight, oriented to person, place and time, alert    Disposition: home  Discharge Condition: Stable    Patient Instructions:   Current Discharge Medication List      START taking these medications    Details   cefdinir (OMNICEF) 300 mg capsule Take 1 Cap by mouth two (2) times a day for 7 days.  Qty: 14 Cap, Refills: 0         CONTINUE these medications which have NOT CHANGED    Details   aspirin delayed-release 81 mg tablet Take 81 mg by mouth daily.      levothyroxine (SYNTHROID) 125 mcg tablet Take 125 mcg by mouth Daily (before breakfast).  bethanechol (URECHOLINE) 5 mg tablet Take 5 mg by mouth Daily (before breakfast).      sodium bicarbonate 650 mg tablet Take 650 mg by mouth daily.      finasteride (PROSCAR) 5 mg tablet Take 5 mg by mouth daily.      cholecalciferol (VITAMIN D3) 1,000 unit tablet Take 2,000 Units by mouth daily.      simvastatin (ZOCOR) 20 mg tablet Take 20 mg by mouth nightly.      GINKGO BILOBA PO Take 60 mg by mouth daily.      cyanocobalamin (VITAMIN B12) 500 mcg tablet Take 500 mcg by mouth daily.      prochlorperazine (COMPAZINE) 10 mg tablet Take 5 mg by mouth every six (6) hours as needed.      meclizine (ANTIVERT) 12.5 mg tablet Take 12.5 mg by mouth three (3) times daily as needed.      metFORMIN (GLUCOPHAGE) 500 mg tablet Take 500 mg by mouth two (2) times daily (with meals).      metoprolol (LOPRESSOR) 25 mg tablet Take 25 mg by mouth two (2) times a day.      allopurinol (ZYLOPRIM) 100 mg tablet Take 100 mg by mouth daily.      ondansetron (ZOFRAN ODT) 4 mg disintegrating tablet Take 1 Tab by mouth every eight (8) hours as needed for Nausea.  Qty: 20 Tab, Refills: 0            Activity: Activity as tolerated  Diet: Regular Diet  Wound Care: None needed    Follow-up with  Follow-up Information     Follow up With Specialties Details Why Contact Info    follow up with your [place]rolog[place]  Schedule an appointment as soon as possible for a visit in 1 week            Follow-up tests/labs: BMP    Signed:  Dayna BarkerKhoi B Latrecia Capito, MD  04/09/2018  9:56 AM    I spent 33 min on discharge

## 2018-04-09 NOTE — Discharge Summary (Signed)
[facility] [address], [place], [place] [id number]  [phone]    Physician Discharge Summary     Patient ID:  Brian NationsDallas Freeman  [id number]  77 y.o.  10/20/1940    Admit date: 04/07/2018    Discharge date and time: 04/09/2018    Admission Diagnoses: Sepsis (HCC) [A41.9]    Discharge Diagnoses:  Principal Diagnosis UTI (urinary tract infection)                                            Principal Problem:    UTI (urinary tract infection) (04/08/2018)    Active Problems:    SIRS (systemic inflammatory response syndrome) (HCC) (04/08/2018)      AKI (acute kidney injury) (HCC) (04/08/2018)      HTN (hypertension) (04/08/2018)      DM (diabetes mellitus) (HCC) (04/08/2018)      Acquired hypothyroidism (04/08/2018)      Gout (04/08/2018)      Acute retention of urine (04/09/2018)           Hospital Course:     77 yo hx of HTN, DM, CAD, hypothyroid, presented w/ sepsis, UTI, AKI  ??  1) Sepsis/UTI: sepsis POA, now much improved.  Likely due to urine retention.  UCx with gram neg rods.  Patient was on IV CTX, will complete a course of omnicef  ??  2) AKI: improving, likely a combination of urine retention and pre-renal.  Renal U/S neg for obstruction.  Patient will f/u with his nephrologist in [place]  ??  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  [place]ist [place]  Cont foley until patient can f/u with his Urologist   ??  4) HTN: cont metoprolol  ??  5) DM type 2: cont metformin since renal function is improving  ??  6) Hypothyroid: cont synthroid    PCP: Other, Phys, MD     Consults: none    Significant Diagnostic Studies: foley    Discharge Exam:  Physical Exam:    Gen: Elderly, thin, frail, NAD  HEENT:  Pink conjunctivae, PERRL, hearing intact to voice, moist mucous membranes  Neck: Supple, without masses, thyroid non-tender  Resp: No accessory muscle use, clear breath sounds without wheezes rales or rhonchi  Card: No murmurs, normal S1, S2 without thrills, bruits or peripheral edema   Abd:  Soft, non-tender, non-distended, normoactive bowel sounds are present  Lymph:  No cervical or inguinal adenopathy  Musc: No cyanosis or clubbing  Skin: No rashes or ulcers, skin turgor is good  Neuro:  Cranial nerves are grossly intact, no focal motor weakness, follows commands appropriately  Psych:  Good insight, oriented to person, place and time, alert    Disposition: home  Discharge Condition: Stable    Patient Instructions:   Current Discharge Medication List      START taking these medications    Details   cefdinir (OMNICEF) 300 mg capsule Take 1 Cap by mouth two (2) times a day for 7 days.  Qty: 14 Cap, Refills: 0         CONTINUE these medications which have NOT CHANGED    Details   aspirin delayed-release 81 mg tablet Take 81 mg by mouth daily.      levothyroxine (SYNTHROID) 125 mcg tablet Take 125 mcg by mouth Daily (before breakfast).  bethanechol (URECHOLINE) 5 mg tablet Take 5 mg by mouth Daily (before breakfast).      sodium bicarbonate 650 mg tablet Take 650 mg by mouth daily.      finasteride (PROSCAR) 5 mg tablet Take 5 mg by mouth daily.      cholecalciferol (VITAMIN D3) 1,000 unit tablet Take 2,000 Units by mouth daily.      simvastatin (ZOCOR) 20 mg tablet Take 20 mg by mouth nightly.      GINKGO BILOBA PO Take 60 mg by mouth daily.      cyanocobalamin (VITAMIN B12) 500 mcg tablet Take 500 mcg by mouth daily.      prochlorperazine (COMPAZINE) 10 mg tablet Take 5 mg by mouth every six (6) hours as needed.      meclizine (ANTIVERT) 12.5 mg tablet Take 12.5 mg by mouth three (3) times daily as needed.      metFORMIN (GLUCOPHAGE) 500 mg tablet Take 500 mg by mouth two (2) times daily (with meals).      metoprolol (LOPRESSOR) 25 mg tablet Take 25 mg by mouth two (2) times a day.      allopurinol (ZYLOPRIM) 100 mg tablet Take 100 mg by mouth daily.      ondansetron (ZOFRAN ODT) 4 mg disintegrating tablet Take 1 Tab by mouth every eight (8) hours as needed for Nausea.   Qty: 20 Tab, Refills: 0           Activity: Activity as tolerated  Diet: Regular Diet  Wound Care: None needed    Follow-up with  Follow-up Information     Follow up With Specialties Details Why Contact Info    follow up with your Urologist and Nephrolgist in [place]  Schedule an appointment as soon as possible for a visit in 1 week            Follow-up tests/labs: BMP    Signed:  Dayna Barker, MD  04/09/2018  9:56 AM    I spent 33 min on discharge

## 2018-04-10 LAB — CULTURE, URINE
Colonies Counted: >100000
Colony Count: >100000

## 2018-04-11 DIAGNOSIS — N401 Enlarged prostate with lower urinary tract symptoms: Secondary | ICD-10-CM | POA: Diagnosis not present

## 2018-04-11 DIAGNOSIS — R1312 Dysphagia, oropharyngeal phase: Secondary | ICD-10-CM | POA: Diagnosis not present

## 2018-04-11 DIAGNOSIS — R339 Retention of urine, unspecified: Secondary | ICD-10-CM | POA: Diagnosis not present

## 2018-04-18 DIAGNOSIS — R1312 Dysphagia, oropharyngeal phase: Secondary | ICD-10-CM | POA: Diagnosis not present

## 2018-04-18 DIAGNOSIS — R339 Retention of urine, unspecified: Secondary | ICD-10-CM | POA: Diagnosis not present

## 2018-04-19 DIAGNOSIS — R1312 Dysphagia, oropharyngeal phase: Secondary | ICD-10-CM | POA: Diagnosis not present

## 2018-04-24 DIAGNOSIS — R339 Retention of urine, unspecified: Secondary | ICD-10-CM | POA: Diagnosis not present

## 2018-04-24 DIAGNOSIS — N401 Enlarged prostate with lower urinary tract symptoms: Secondary | ICD-10-CM | POA: Diagnosis not present

## 2018-04-24 DIAGNOSIS — R300 Dysuria: Secondary | ICD-10-CM | POA: Diagnosis not present

## 2018-05-29 DIAGNOSIS — E119 Type 2 diabetes mellitus without complications: Secondary | ICD-10-CM | POA: Diagnosis not present

## 2018-05-29 DIAGNOSIS — H4302 Vitreous prolapse, left eye: Secondary | ICD-10-CM | POA: Diagnosis not present

## 2018-05-29 DIAGNOSIS — H52223 Regular astigmatism, bilateral: Secondary | ICD-10-CM | POA: Diagnosis not present

## 2018-05-30 DIAGNOSIS — R1312 Dysphagia, oropharyngeal phase: Secondary | ICD-10-CM | POA: Diagnosis not present

## 2018-06-06 DIAGNOSIS — N401 Enlarged prostate with lower urinary tract symptoms: Secondary | ICD-10-CM | POA: Diagnosis not present

## 2018-06-06 DIAGNOSIS — R339 Retention of urine, unspecified: Secondary | ICD-10-CM | POA: Diagnosis not present

## 2018-06-07 DIAGNOSIS — R1312 Dysphagia, oropharyngeal phase: Secondary | ICD-10-CM | POA: Diagnosis not present

## 2018-06-30 DIAGNOSIS — J0100 Acute maxillary sinusitis, unspecified: Secondary | ICD-10-CM | POA: Diagnosis not present

## 2018-06-30 DIAGNOSIS — H66001 Acute suppurative otitis media without spontaneous rupture of ear drum, right ear: Secondary | ICD-10-CM | POA: Diagnosis not present

## 2018-07-06 DIAGNOSIS — M10071 Idiopathic gout, right ankle and foot: Secondary | ICD-10-CM | POA: Diagnosis not present

## 2018-07-31 DIAGNOSIS — N183 Chronic kidney disease, stage 3 (moderate): Secondary | ICD-10-CM | POA: Diagnosis not present

## 2018-07-31 DIAGNOSIS — I2510 Atherosclerotic heart disease of native coronary artery without angina pectoris: Secondary | ICD-10-CM | POA: Diagnosis not present

## 2018-07-31 DIAGNOSIS — Z6825 Body mass index (BMI) 25.0-25.9, adult: Secondary | ICD-10-CM | POA: Diagnosis not present

## 2018-07-31 DIAGNOSIS — I69811 Memory deficit following other cerebrovascular disease: Secondary | ICD-10-CM | POA: Diagnosis not present

## 2018-07-31 DIAGNOSIS — E1121 Type 2 diabetes mellitus with diabetic nephropathy: Secondary | ICD-10-CM | POA: Diagnosis not present

## 2018-07-31 DIAGNOSIS — E038 Other specified hypothyroidism: Secondary | ICD-10-CM | POA: Diagnosis not present

## 2018-07-31 DIAGNOSIS — E782 Mixed hyperlipidemia: Secondary | ICD-10-CM | POA: Diagnosis not present

## 2018-08-03 DIAGNOSIS — E1169 Type 2 diabetes mellitus with other specified complication: Secondary | ICD-10-CM | POA: Diagnosis not present

## 2018-08-03 DIAGNOSIS — E875 Hyperkalemia: Secondary | ICD-10-CM | POA: Diagnosis not present

## 2018-08-03 DIAGNOSIS — N189 Chronic kidney disease, unspecified: Secondary | ICD-10-CM | POA: Diagnosis not present

## 2018-08-03 DIAGNOSIS — N200 Calculus of kidney: Secondary | ICD-10-CM | POA: Diagnosis not present

## 2018-08-03 DIAGNOSIS — I10 Essential (primary) hypertension: Secondary | ICD-10-CM | POA: Diagnosis not present

## 2018-08-03 DIAGNOSIS — E039 Hypothyroidism, unspecified: Secondary | ICD-10-CM | POA: Diagnosis not present

## 2018-08-23 DIAGNOSIS — R309 Painful micturition, unspecified: Secondary | ICD-10-CM | POA: Diagnosis not present

## 2018-08-23 DIAGNOSIS — R809 Proteinuria, unspecified: Secondary | ICD-10-CM | POA: Diagnosis not present

## 2018-08-23 DIAGNOSIS — N189 Chronic kidney disease, unspecified: Secondary | ICD-10-CM | POA: Diagnosis not present

## 2018-09-07 DIAGNOSIS — N401 Enlarged prostate with lower urinary tract symptoms: Secondary | ICD-10-CM | POA: Diagnosis not present

## 2018-09-07 DIAGNOSIS — R350 Frequency of micturition: Secondary | ICD-10-CM | POA: Diagnosis not present

## 2018-09-07 DIAGNOSIS — R339 Retention of urine, unspecified: Secondary | ICD-10-CM | POA: Diagnosis not present

## 2018-10-05 DIAGNOSIS — N189 Chronic kidney disease, unspecified: Secondary | ICD-10-CM | POA: Diagnosis not present

## 2018-10-05 DIAGNOSIS — N200 Calculus of kidney: Secondary | ICD-10-CM | POA: Diagnosis not present

## 2018-10-05 DIAGNOSIS — D649 Anemia, unspecified: Secondary | ICD-10-CM | POA: Diagnosis not present

## 2018-10-05 DIAGNOSIS — E211 Secondary hyperparathyroidism, not elsewhere classified: Secondary | ICD-10-CM | POA: Diagnosis not present

## 2018-10-05 DIAGNOSIS — E039 Hypothyroidism, unspecified: Secondary | ICD-10-CM | POA: Diagnosis not present

## 2018-10-05 DIAGNOSIS — E875 Hyperkalemia: Secondary | ICD-10-CM | POA: Diagnosis not present

## 2018-10-05 DIAGNOSIS — I10 Essential (primary) hypertension: Secondary | ICD-10-CM | POA: Diagnosis not present

## 2018-10-05 DIAGNOSIS — E1169 Type 2 diabetes mellitus with other specified complication: Secondary | ICD-10-CM | POA: Diagnosis not present

## 2018-10-05 DIAGNOSIS — E559 Vitamin D deficiency, unspecified: Secondary | ICD-10-CM | POA: Diagnosis not present

## 2018-11-29 DIAGNOSIS — H906 Mixed conductive and sensorineural hearing loss, bilateral: Secondary | ICD-10-CM | POA: Diagnosis not present

## 2018-11-29 DIAGNOSIS — E1122 Type 2 diabetes mellitus with diabetic chronic kidney disease: Secondary | ICD-10-CM | POA: Diagnosis not present

## 2018-11-29 DIAGNOSIS — N183 Chronic kidney disease, stage 3 (moderate): Secondary | ICD-10-CM | POA: Diagnosis not present

## 2018-11-29 DIAGNOSIS — E038 Other specified hypothyroidism: Secondary | ICD-10-CM | POA: Diagnosis not present

## 2018-11-29 DIAGNOSIS — I69811 Memory deficit following other cerebrovascular disease: Secondary | ICD-10-CM | POA: Diagnosis not present

## 2018-11-29 DIAGNOSIS — I2510 Atherosclerotic heart disease of native coronary artery without angina pectoris: Secondary | ICD-10-CM | POA: Diagnosis not present

## 2018-11-29 DIAGNOSIS — M1040 Other secondary gout, unspecified site: Secondary | ICD-10-CM | POA: Diagnosis not present

## 2018-11-29 DIAGNOSIS — E782 Mixed hyperlipidemia: Secondary | ICD-10-CM | POA: Diagnosis not present

## 2018-11-29 DIAGNOSIS — N401 Enlarged prostate with lower urinary tract symptoms: Secondary | ICD-10-CM | POA: Diagnosis not present

## 2019-01-12 ENCOUNTER — Telehealth: Payer: Self-pay | Admitting: *Deleted

## 2019-01-12 NOTE — Telephone Encounter (Signed)
   Primary Cardiologist:  [facility]  Patient contacted.  History reviewed.  No symptoms to suggest any unstable cardiac conditions.  Based on discussion, with current pandemic situation, we will be postponing this appointment for David Baxter with a plan for f/u in 6-12 wks or sooner if feasible/necessary.  If symptoms change, he has been instructed to contact our office.    Crist Fat, RN  01/12/2019 5:04 PM         .

## 2019-01-15 ENCOUNTER — Ambulatory Visit: Payer: Medicare PPO | Admitting: Cardiology

## 2019-01-15 DIAGNOSIS — E038 Other specified hypothyroidism: Secondary | ICD-10-CM | POA: Diagnosis not present

## 2019-02-01 NOTE — Telephone Encounter (Signed)
Attempted to contact pt, no answer no v/m

## 2019-03-06 DIAGNOSIS — E038 Other specified hypothyroidism: Secondary | ICD-10-CM | POA: Diagnosis not present

## 2019-03-13 DIAGNOSIS — R339 Retention of urine, unspecified: Secondary | ICD-10-CM | POA: Diagnosis not present

## 2019-03-13 DIAGNOSIS — N1330 Unspecified hydronephrosis: Secondary | ICD-10-CM | POA: Diagnosis not present

## 2019-03-13 DIAGNOSIS — N401 Enlarged prostate with lower urinary tract symptoms: Secondary | ICD-10-CM | POA: Diagnosis not present

## 2019-03-30 DIAGNOSIS — E211 Secondary hyperparathyroidism, not elsewhere classified: Secondary | ICD-10-CM | POA: Diagnosis not present

## 2019-03-30 DIAGNOSIS — D649 Anemia, unspecified: Secondary | ICD-10-CM | POA: Diagnosis not present

## 2019-03-30 DIAGNOSIS — N200 Calculus of kidney: Secondary | ICD-10-CM | POA: Diagnosis not present

## 2019-03-30 DIAGNOSIS — E875 Hyperkalemia: Secondary | ICD-10-CM | POA: Diagnosis not present

## 2019-03-30 DIAGNOSIS — E039 Hypothyroidism, unspecified: Secondary | ICD-10-CM | POA: Diagnosis not present

## 2019-03-30 DIAGNOSIS — E1169 Type 2 diabetes mellitus with other specified complication: Secondary | ICD-10-CM | POA: Diagnosis not present

## 2019-03-30 DIAGNOSIS — M109 Gout, unspecified: Secondary | ICD-10-CM | POA: Diagnosis not present

## 2019-03-30 DIAGNOSIS — I10 Essential (primary) hypertension: Secondary | ICD-10-CM | POA: Diagnosis not present

## 2019-03-30 DIAGNOSIS — E559 Vitamin D deficiency, unspecified: Secondary | ICD-10-CM | POA: Diagnosis not present

## 2019-03-30 DIAGNOSIS — N189 Chronic kidney disease, unspecified: Secondary | ICD-10-CM | POA: Diagnosis not present

## 2019-04-02 DIAGNOSIS — E1122 Type 2 diabetes mellitus with diabetic chronic kidney disease: Secondary | ICD-10-CM | POA: Diagnosis not present

## 2019-04-02 DIAGNOSIS — E038 Other specified hypothyroidism: Secondary | ICD-10-CM | POA: Diagnosis not present

## 2019-04-02 DIAGNOSIS — N2581 Secondary hyperparathyroidism of renal origin: Secondary | ICD-10-CM | POA: Diagnosis not present

## 2019-04-02 DIAGNOSIS — E782 Mixed hyperlipidemia: Secondary | ICD-10-CM | POA: Diagnosis not present

## 2019-04-02 DIAGNOSIS — M10071 Idiopathic gout, right ankle and foot: Secondary | ICD-10-CM | POA: Diagnosis not present

## 2019-04-02 DIAGNOSIS — Z1159 Encounter for screening for other viral diseases: Secondary | ICD-10-CM | POA: Diagnosis not present

## 2019-04-02 DIAGNOSIS — N183 Chronic kidney disease, stage 3 (moderate): Secondary | ICD-10-CM | POA: Diagnosis not present

## 2019-04-02 DIAGNOSIS — I2510 Atherosclerotic heart disease of native coronary artery without angina pectoris: Secondary | ICD-10-CM | POA: Diagnosis not present

## 2019-04-02 DIAGNOSIS — I69811 Memory deficit following other cerebrovascular disease: Secondary | ICD-10-CM | POA: Diagnosis not present

## 2019-05-03 DIAGNOSIS — Z1159 Encounter for screening for other viral diseases: Secondary | ICD-10-CM | POA: Diagnosis not present

## 2019-05-03 DIAGNOSIS — Z6822 Body mass index (BMI) 22.0-22.9, adult: Secondary | ICD-10-CM | POA: Diagnosis not present

## 2019-05-03 DIAGNOSIS — Z0000 Encounter for general adult medical examination without abnormal findings: Secondary | ICD-10-CM | POA: Diagnosis not present

## 2019-05-16 DIAGNOSIS — E038 Other specified hypothyroidism: Secondary | ICD-10-CM | POA: Diagnosis not present

## 2019-05-22 ENCOUNTER — Other Ambulatory Visit: Payer: Self-pay

## 2019-05-22 ENCOUNTER — Encounter: Payer: Self-pay | Admitting: Cardiology

## 2019-05-22 ENCOUNTER — Ambulatory Visit (INDEPENDENT_AMBULATORY_CARE_PROVIDER_SITE_OTHER): Payer: Medicare PPO | Admitting: Cardiology

## 2019-05-22 DIAGNOSIS — I2510 Atherosclerotic heart disease of native coronary artery without angina pectoris: Secondary | ICD-10-CM | POA: Diagnosis not present

## 2019-05-22 DIAGNOSIS — E782 Mixed hyperlipidemia: Secondary | ICD-10-CM | POA: Diagnosis not present

## 2019-05-22 DIAGNOSIS — N183 Chronic kidney disease, stage 3 unspecified: Secondary | ICD-10-CM

## 2019-05-22 DIAGNOSIS — I129 Hypertensive chronic kidney disease with stage 1 through stage 4 chronic kidney disease, or unspecified chronic kidney disease: Secondary | ICD-10-CM

## 2019-05-22 DIAGNOSIS — E785 Hyperlipidemia, unspecified: Secondary | ICD-10-CM | POA: Insufficient documentation

## 2019-05-22 NOTE — Patient Instructions (Signed)
Medication Instructions:  Your physician recommends that you continue on your current medications as directed. Please refer to the Current Medication list given to you today.  If you need a refill on your cardiac medications before your next appointment, please call your pharmacy.   Lab work: None If you have labs (blood work) drawn today and your tests are completely normal, you will receive your results only by: . MyChart Message (if you have MyChart) OR . A paper copy in the mail If you have any lab test that is abnormal or we need to change your treatment, we will call you to review the results.  Testing/Procedures: None  Follow-Up: At [facility], you and your health needs are our priority.  As part of our continuing mission to provide you with exceptional heart care, we have created designated Provider Care Teams.  These Care Teams include your primary Cardiologist (physician) and Advanced Practice Providers (APPs -  Physician Assistants and Nurse Practitioners) who all work together to provide you with the care you need, when you need it. You will need a follow up appointment in 1 years.  Please call our office 2 months in advance to schedule this appointment.  You may see No primary care provider on file. or another member of our [facility] Provider Team in [place]: Robert Krasowski, MD . Rajan Revankar, MD  Any Other Special Instructions Will Be Listed Below (If Applicable).    

## 2019-05-22 NOTE — Progress Notes (Signed)
Cardiology Office Note:    Date:  05/22/2019   ID:  David Baxter, DOB 01/09/1941, MRN [id number]  PCP:  Abigail MiyamotoPerry, Lawrence Edward, MD  Cardiologist:  Norman HerrlichBrian Kevion Fatheree, MD    Referring MD: Abigail MiyamotoPerry, Lawrence Edward,*    ASSESSMENT:    1. CAD in native artery   2. 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 Mixed hyperlipidemia    PLAN:    In order of problems listed above:  1. CAD stable asymptomatic having no angina on current medical treatment and he will continue aspirin beta-blocker and his intermediate intensity statin. 2. Stable hypertension repeat blood pressure by me is 122/60 in the office continue beta-blocker 3. Stable continue statin high risk with CAD LDL at target and he is having no muscle side effects 4. Stable CKD   Next appointment: 1 year   Medication Adjustments/Labs and Tests Ordered: Current medicines are reviewed at length with the patient today.  Concerns regarding medicines are outlined above.  No orders of the defined types were placed in this encounter.  No orders of the defined types were placed in this encounter.   Chief Complaint  Patient presents with  . Follow-up  . Coronary Artery Disease    History of Present Illness:    David Baxter is a 78 y.o. male with a hx of CAD with PCI and 2 DES to LCF, T2 DM, hypertension, hyperlipidemia, CKD stage 3  and orthostatic hypotension  [facility] Compliance with diet, lifestyle and medications: Yes Per cardiology's perspective he is doing well he has no exercise intolerance chest pain dyspnea palpitation or syncope he has CKD and takes both Lokelma for hyperkalemia and sodium bicarbonate.  Recent labs 04/02/2019 shows cholesterol 130 HDL 42 LDL at target 67 A1c 5.9% normal hemoglobin normal 14.5 creatinine 1.65 TSH normal. Past Medical History:  Diagnosis Date  . Arthralgia of both lower legs   . BPH (benign prostatic hyperplasia)   . CAD  (coronary artery disease)    cardiologist--  dr Dulce Sellarmunley w/ [facility] [place]  . CKD (chronic kidney disease), stage III (HCC)   . History of gout   . History of kidney stones   . History of stroke    per pt/ wife -- unknown when-- told this was on MRI in 2017  . HOH (hard of hearing)   . Hyperlipidemia   . Hypertension   . Hypothyroidism   . S/P drug eluting coronary stent placement    2012-- x1DES to OM:  05/ 2016  x2 to CFX  . Type 2 diabetes mellitus (HCC)     Past Surgical History:  Procedure Laterality Date  . CORONARY ANGIOPLASTY WITH STENT PLACEMENT  03/04/2015   x2 DES to CFX  . CORONARY ANGIOPLASTY WITH STENT PLACEMENT  2012   x1 DES to OM  . CYSTOSCOPY WITH INSERTION OF UROLIFT N/A 02/14/2017   Procedure: CYSTOSCOPY WITH INSERTION OF UROLIFT;  Surgeon: Malen GauzePatrick L McKenzie, MD;  [facility];  Service: Urology;  Laterality: N/A;  . EXTRACORPOREAL SHOCK WAVE LITHOTRIPSY  1990's  . PERCUTANEOUS NEPHROSTOLITHOTOMY  1991   and abdominal hernia repair  . URETEROLITHOTOMY  1991 and 1994    Current Medications: Current Meds  Medication Sig  . allopurinol (ZYLOPRIM) 100 MG tablet daily.  Marland Kitchen. aspirin EC 81 MG tablet Take 81 mg by mouth daily.  . bethanechol (URECHOLINE) 5 MG tablet Take by  mouth 2 (two) times daily.  . Cholecalciferol (VITAMIN D-1000 MAX ST) 25 MCG (1000 UT) tablet Take by mouth.  . finasteride (PROSCAR) 5 MG tablet Take 5 mg by mouth every evening.  . Ginkgo Biloba (GINKOBA PO) Take by mouth.  . levothyroxine (SYNTHROID) 125 MCG tablet Take 125 mcg by mouth daily before breakfast.  . LOKELMA 10 g PACK packet 2 (two) times daily.  . metformin (FORTAMET) 500 MG (OSM) 24 hr tablet Take 500 mg by mouth 2 (two) times daily with a meal.  . metoprolol tartrate (LOPRESSOR) 25 MG tablet Take by mouth 2 (two) times daily.  . simvastatin (ZOCOR) 40 MG tablet Take 20 mg by mouth every morning.   . sodium bicarbonate 650 MG tablet Take  by mouth 2 (two) times daily.  . vitamin B-12 (CYANOCOBALAMIN) 1000 MCG tablet Take 1,000 mcg by mouth daily.  . [DISCONTINUED] metoprolol succinate (TOPROL-XL) 25 MG 24 hr tablet Take 25 mg by mouth every morning.     Allergies:   Aspirin   Social History   Socioeconomic History  . Marital status: Married    Spouse name: Not on file  . Number of children: Not on file  . Years of education: Not on file  . Highest education level: Not on file  Occupational History  . Not on file  Social Needs  . Financial resource strain: Not on file  . Food insecurity    Worry: Not on file    Inability: Not on file  . Transportation needs    Medical: Not on file    Non-medical: Not on file  Tobacco Use  . Smoking status: Never Smoker  . Smokeless tobacco: Never Used  Substance and Sexual Activity  . Alcohol use: No  . Drug use: No  . Sexual activity: Not on file  Lifestyle  . Physical activity    Days per week: Not on file    Minutes per session: Not on file  . Stress: Not on file  Relationships  . Social Musicianconnections    Talks on phone: Not on file    Gets together: Not on file    Attends religious service: Not on file    Active member of club or organization: Not on file    Attends meetings of clubs or organizations: Not on file    Relationship status: Not on file  Other Topics Concern  . Not on file  Social History Narrative  . Not on file     Family History: The patient's family history includes Heart disease (age of onset: 5874) in his mother; Heart disease (age of onset: 5976) in his father; Hypertension in his father. ROS:   Please see the history of present illness.    All other systems reviewed and are negative.  EKGs/Labs/Other Studies Reviewed:    The following studies were reviewed today:  EKG:  EKG ordered today and personally reviewed.  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    Physical Exam:    VS:  BP 90/60 (BP Location: Right Arm,  Patient Position: Sitting, Cuff Size: Normal)   Pulse 81   Temp 99.1 F (37.3 C)   Ht 5\' 9"  (1.753 m)   Wt 164 lb (74.4 kg)   SpO2 97%   BMI 24.22 kg/m     Wt Readings from Last 3 Encounters:  05/22/19 164 lb (74.4 kg)  02/14/17 180 lb (81.6 kg)  12/01/15 190 lb (86.2 kg)  GEN:  Well nourished, well developed in no acute distress HEENT: Normal NECK: No JVD; No carotid bruits LYMPHATICS: No lymphadenopathy CARDIAC: RRR, no murmurs, rubs, gallops RESPIRATORY:  Clear to auscultation without rales, wheezing or rhonchi  ABDOMEN: Soft, non-tender, non-distended MUSCULOSKELETAL:  No edema; No deformity  SKIN: Warm and dry NEUROLOGIC:  Alert and oriented x 3 PSYCHIATRIC:  Normal affect    Signed, Shirlee More, MD  05/22/2019 3:44 PM     [facility]

## 2019-05-23 DIAGNOSIS — N189 Chronic kidney disease, unspecified: Secondary | ICD-10-CM | POA: Diagnosis not present

## 2019-08-02 DIAGNOSIS — E1122 Type 2 diabetes mellitus with diabetic chronic kidney disease: Secondary | ICD-10-CM | POA: Diagnosis not present

## 2019-08-02 DIAGNOSIS — N1831 Chronic kidney disease, stage 3a: Secondary | ICD-10-CM | POA: Diagnosis not present

## 2019-08-02 DIAGNOSIS — I69811 Memory deficit following other cerebrovascular disease: Secondary | ICD-10-CM | POA: Diagnosis not present

## 2019-08-02 DIAGNOSIS — M1040 Other secondary gout, unspecified site: Secondary | ICD-10-CM | POA: Diagnosis not present

## 2019-08-02 DIAGNOSIS — Z1159 Encounter for screening for other viral diseases: Secondary | ICD-10-CM | POA: Diagnosis not present

## 2019-08-02 DIAGNOSIS — E038 Other specified hypothyroidism: Secondary | ICD-10-CM | POA: Diagnosis not present

## 2019-08-02 DIAGNOSIS — Z23 Encounter for immunization: Secondary | ICD-10-CM | POA: Diagnosis not present

## 2019-08-02 DIAGNOSIS — I2510 Atherosclerotic heart disease of native coronary artery without angina pectoris: Secondary | ICD-10-CM | POA: Diagnosis not present

## 2019-08-02 DIAGNOSIS — E782 Mixed hyperlipidemia: Secondary | ICD-10-CM | POA: Diagnosis not present

## 2019-09-06 DIAGNOSIS — N401 Enlarged prostate with lower urinary tract symptoms: Secondary | ICD-10-CM | POA: Diagnosis not present

## 2019-09-06 DIAGNOSIS — R339 Retention of urine, unspecified: Secondary | ICD-10-CM | POA: Diagnosis not present

## 2019-09-26 DIAGNOSIS — H40013 Open angle with borderline findings, low risk, bilateral: Secondary | ICD-10-CM | POA: Diagnosis not present

## 2019-09-26 DIAGNOSIS — H43393 Other vitreous opacities, bilateral: Secondary | ICD-10-CM | POA: Diagnosis not present

## 2019-09-26 DIAGNOSIS — H5203 Hypermetropia, bilateral: Secondary | ICD-10-CM | POA: Diagnosis not present

## 2019-09-27 DIAGNOSIS — E875 Hyperkalemia: Secondary | ICD-10-CM | POA: Diagnosis not present

## 2019-09-27 DIAGNOSIS — N200 Calculus of kidney: Secondary | ICD-10-CM | POA: Diagnosis not present

## 2019-09-27 DIAGNOSIS — R309 Painful micturition, unspecified: Secondary | ICD-10-CM | POA: Diagnosis not present

## 2019-09-27 DIAGNOSIS — E039 Hypothyroidism, unspecified: Secondary | ICD-10-CM | POA: Diagnosis not present

## 2019-09-27 DIAGNOSIS — I10 Essential (primary) hypertension: Secondary | ICD-10-CM | POA: Diagnosis not present

## 2019-09-27 DIAGNOSIS — E211 Secondary hyperparathyroidism, not elsewhere classified: Secondary | ICD-10-CM | POA: Diagnosis not present

## 2019-09-27 DIAGNOSIS — R809 Proteinuria, unspecified: Secondary | ICD-10-CM | POA: Diagnosis not present

## 2019-09-27 DIAGNOSIS — N189 Chronic kidney disease, unspecified: Secondary | ICD-10-CM | POA: Diagnosis not present

## 2019-09-27 DIAGNOSIS — M109 Gout, unspecified: Secondary | ICD-10-CM | POA: Diagnosis not present

## 2019-09-27 DIAGNOSIS — E559 Vitamin D deficiency, unspecified: Secondary | ICD-10-CM | POA: Diagnosis not present

## 2019-09-27 DIAGNOSIS — E1169 Type 2 diabetes mellitus with other specified complication: Secondary | ICD-10-CM | POA: Diagnosis not present

## 2019-09-28 ENCOUNTER — Other Ambulatory Visit: Payer: Self-pay | Admitting: *Deleted

## 2019-09-28 MED ORDER — METOPROLOL TARTRATE 25 MG PO TABS: 25.0000 mg | ORAL_TABLET | Freq: Two times a day (BID) | ORAL | 1 refills | Status: DC

## 2019-10-01 DIAGNOSIS — E038 Other specified hypothyroidism: Secondary | ICD-10-CM | POA: Diagnosis not present

## 2019-12-05 ENCOUNTER — Encounter: Payer: Self-pay | Admitting: Legal Medicine

## 2019-12-05 DIAGNOSIS — N400 Enlarged prostate without lower urinary tract symptoms: Secondary | ICD-10-CM | POA: Insufficient documentation

## 2019-12-05 DIAGNOSIS — E211 Secondary hyperparathyroidism, not elsewhere classified: Secondary | ICD-10-CM | POA: Insufficient documentation

## 2019-12-05 DIAGNOSIS — E1121 Type 2 diabetes mellitus with diabetic nephropathy: Secondary | ICD-10-CM | POA: Insufficient documentation

## 2019-12-05 DIAGNOSIS — N401 Enlarged prostate with lower urinary tract symptoms: Secondary | ICD-10-CM

## 2019-12-05 DIAGNOSIS — N138 Other obstructive and reflux uropathy: Secondary | ICD-10-CM | POA: Insufficient documentation

## 2019-12-05 HISTORY — DX: Other obstructive and reflux uropathy: N40.1

## 2019-12-05 HISTORY — DX: Secondary hyperparathyroidism, not elsewhere classified: E21.1

## 2019-12-05 HISTORY — DX: Other obstructive and reflux uropathy: N13.8

## 2019-12-05 HISTORY — DX: Type 2 diabetes mellitus with diabetic nephropathy: E11.21

## 2019-12-05 HISTORY — DX: Benign prostatic hyperplasia without lower urinary tract symptoms: N40.0

## 2019-12-06 ENCOUNTER — Telehealth (INDEPENDENT_AMBULATORY_CARE_PROVIDER_SITE_OTHER): Payer: Medicare PPO | Admitting: Legal Medicine

## 2019-12-06 ENCOUNTER — Encounter: Payer: Self-pay | Admitting: Legal Medicine

## 2019-12-06 VITALS — BP 145/82 | HR 66 | Temp 97.1°F | Ht 69.0 in | Wt 151.0 lb

## 2019-12-06 DIAGNOSIS — E1121 Type 2 diabetes mellitus with diabetic nephropathy: Secondary | ICD-10-CM | POA: Diagnosis not present

## 2019-12-06 DIAGNOSIS — E782 Mixed hyperlipidemia: Secondary | ICD-10-CM

## 2019-12-06 DIAGNOSIS — N1831 Chronic kidney disease, stage 3a: Secondary | ICD-10-CM | POA: Diagnosis not present

## 2019-12-06 DIAGNOSIS — E211 Secondary hyperparathyroidism, not elsewhere classified: Secondary | ICD-10-CM

## 2019-12-06 DIAGNOSIS — N401 Enlarged prostate with lower urinary tract symptoms: Secondary | ICD-10-CM | POA: Diagnosis not present

## 2019-12-06 DIAGNOSIS — N138 Other obstructive and reflux uropathy: Secondary | ICD-10-CM | POA: Diagnosis not present

## 2019-12-06 DIAGNOSIS — E039 Hypothyroidism, unspecified: Secondary | ICD-10-CM

## 2019-12-06 DIAGNOSIS — I25708 Atherosclerosis of coronary artery bypass graft(s), unspecified, with other forms of angina pectoris: Secondary | ICD-10-CM | POA: Diagnosis not present

## 2019-12-06 DIAGNOSIS — I10 Essential (primary) hypertension: Secondary | ICD-10-CM | POA: Diagnosis not present

## 2019-12-06 NOTE — Assessment & Plan Note (Signed)
An individual care plan was established and reinforced today.  The patient's status was assessed using clinical findings on exam and labs or diagnostic tests. The patient's success at meeting treatment goals on disease specific evidence-based guidelines and found to be good controlled. SELF MANAGEMENT: The patient and I together assessed ways to personally work towards obtaining the recommended goals. RECOMMENDATIONS: avoid decongestants found in common cold remedies, decrease consumption of alcohol, perform routine monitoring of BP with home BP cuff, exercise, reduction of dietary salt, take medicines as prescribed, try not to miss doses and quit smoking.  Regular exercise and maintaining a healthy weight is needed.  Stress reduction may help. A CLINICAL SUMMARY including written plan identify barriers to care unique to individual due to social or financial issues.  We attempt to mutually creat solutions for individual and family understanding. 

## 2019-12-06 NOTE — Progress Notes (Signed)
Acute Office Visit  Subjective:    Patient ID: David Baxter, male    DOB: December 07, 1940, 79 y.o.   MRN: [id number]  Chief Complaint  Patient presents with  . Diabetes  . Hypertension  . Coronary Artery Disease  . Hyperlipidemia    HPI Patient is in today for Chronic visit.  Patient present with type 2 diabetes.  Specifically, this is type 2, no insulin requiring diabetes, complicated by renal failure.  Compliance with treatment has been good; patient take medicines as directed, maintains diet and exercise regimen, follows up as directed, and is keeping glucose diary.  Date of  diagnosis 2015.  Depression screen has been performed.Tobacco screen nonsmoker. Current medicines for diabetes metformin.  Patient is on nothing for renal protection and simvastatin for cholesterol control.  Patient performs foot exams daily and last ophthalmologic exam was 2020.  Patient presents for follow up of hypertension.  Patient tolerating metoprolol well with side effects.  Patient was diagnosed with hypertension 2010 so has been treated for hypertension for 10 years.Patient is working on maintaining diet and exercise regimen and follows up as directed. Complication include CAD.  Patient presents with hyperlipidemia.  Compliance with treatment has been good; patient takes medicines as directed, maintains low cholesterol diet, follows up as directed, and maintains exercise regimen.  Patient is using simvastatin without problems.  CORONARY ARTERY DISEASE  Patient presents in follow up of CAD. Patient was diagnosed in 2015. The patient has no associated CHF. The patient is currently taking a beta blocker, statin, and aspirin. CAD was diagnosed 5 years ago.  Patient is having no angina. Patient has used no NTG.  Patient is followed by cardiology.  Patient had stent . Last angiography was 2016, last echocardiogram unknown.  Past Medical History:  Diagnosis Date  . Arthralgia of both lower legs   . BPH (benign  prostatic hyperplasia)   . CAD (coronary artery disease)    cardiologist--  dr Dulce Sellar w/ [facility] in [place]  . CKD (chronic kidney disease), stage III   . History of gout   . History of kidney stones   . History of stroke    per pt/ wife -- unknown when-- told this was on MRI in 2017  . HOH (hard of hearing)   . Hyperlipidemia   . Hypertension   . Hypothyroidism   . S/P drug eluting coronary stent placement    2012-- x1DES to OM:  05/ 2016  x2 to CFX  . Type 2 diabetes mellitus (HCC)     Past Surgical History:  Procedure Laterality Date  . CORONARY ANGIOPLASTY WITH STENT PLACEMENT  03/04/2015   x2 DES to CFX  . CORONARY ANGIOPLASTY WITH STENT PLACEMENT  2012   x1 DES to OM  . CYSTOSCOPY WITH INSERTION OF UROLIFT N/A 02/14/2017   Procedure: CYSTOSCOPY WITH INSERTION OF UROLIFT;  Surgeon: Malen Gauze, MD;  Location: [facility];  Service: Urology;  Laterality: N/A;  . EXTRACORPOREAL SHOCK WAVE LITHOTRIPSY  1990's  . PERCUTANEOUS NEPHROSTOLITHOTOMY  1991   and abdominal hernia repair  . URETEROLITHOTOMY  1991 and 1994    Family History  Problem Relation Age of Onset  . Heart disease Mother 39       After age 6 - CHF  . Hypertension Father   . Heart disease Father 1       After age 4 - CHF    Social History   Socioeconomic History  . Marital status:  Married    Spouse name: Not on file  . Number of children: Not on file  . Years of education: Not on file  . Highest education level: Not on file  Occupational History  . Occupation: truck Geophysicist/field seismologist  Tobacco Use  . Smoking status: Never Smoker  . Smokeless tobacco: Never Used  Substance and Sexual Activity  . Alcohol use: No  . Drug use: No  . Sexual activity: Not on file  Other Topics Concern  . Not on file  Social History Narrative  . Not on file   Social Determinants of Health   Financial Resource Strain:   . Difficulty of Paying Living Expenses: Not on file  Food  Insecurity:   . Worried About Charity fundraiser in the Last Year: Not on file  . Ran Out of Food in the Last Year: Not on file  Transportation Needs:   . Lack of Transportation (Medical): Not on file  . Lack of Transportation (Non-Medical): Not on file  Physical Activity:   . Days of Exercise per Week: Not on file  . Minutes of Exercise per Session: Not on file  Stress:   . Feeling of Stress : Not on file  Social Connections:   . Frequency of Communication with Friends and Family: Not on file  . Frequency of Social Gatherings with Friends and Family: Not on file  . Attends Religious Services: Not on file  . Active Member of Clubs or Organizations: Not on file  . Attends Archivist Meetings: Not on file  . Marital Status: Not on file  Intimate Partner Violence:   . Fear of Current or Ex-Partner: Not on file  . Emotionally Abused: Not on file  . Physically Abused: Not on file  . Sexually Abused: Not on file    Outpatient Medications Prior to Visit  Medication Sig Dispense Refill  . levothyroxine (SYNTHROID) 100 MCG tablet Take 100 mcg by mouth daily before breakfast.    . Accu-Chek Softclix Lancets lancets     . allopurinol (ZYLOPRIM) 100 MG tablet daily.    Marland Kitchen aspirin EC 81 MG tablet Take 81 mg by mouth daily.    . bethanechol (URECHOLINE) 5 MG tablet Take by mouth 2 (two) times daily.    . Cholecalciferol (VITAMIN D-1000 MAX ST) 25 MCG (1000 UT) tablet Take by mouth.    . COLCRYS 0.6 MG tablet     . finasteride (PROSCAR) 5 MG tablet Take 5 mg by mouth every evening.    . Ginkgo Biloba (GINKOBA PO) Take by mouth.    . LOKELMA 10 g PACK packet 2 (two) times daily.    . metformin (FORTAMET) 500 MG (OSM) 24 hr tablet Take 500 mg by mouth 2 (two) times daily with a meal.    . metoprolol tartrate (LOPRESSOR) 25 MG tablet Take 1 tablet (25 mg total) by mouth 2 (two) times daily. 180 tablet 1  . simvastatin (ZOCOR) 40 MG tablet Take 20 mg by mouth every morning.     .  sodium bicarbonate 650 MG tablet Take by mouth 2 (two) times daily.    . vitamin B-12 (CYANOCOBALAMIN) 1000 MCG tablet Take 1,000 mcg by mouth daily.    Marland Kitchen levothyroxine (SYNTHROID) 125 MCG tablet Take 125 mcg by mouth daily before breakfast.    . metFORMIN (GLUCOPHAGE) 500 MG tablet      No facility-administered medications prior to visit.    Allergies  Allergen Reactions  . Aspirin Other (See  Comments)    Upset stomach    Review of Systems  Constitutional: Negative.   HENT: Negative.   Eyes: Negative.   Respiratory: Negative.   Cardiovascular: Negative.   Gastrointestinal: Negative.   Endocrine: Negative.   Genitourinary: Negative.   Musculoskeletal: Negative.   Skin: Negative.   Neurological: Negative.   Psychiatric/Behavioral: Negative.        Objective:    Physical Exam  No PE due to telephone video visit  BP (!) 145/82   Pulse 66   Temp (!) 97.1 F (36.2 C)   Ht 5\' 9"  (1.753 m)   Wt 151 lb (68.5 kg)   BMI 22.30 kg/m  Wt Readings from Last 3 Encounters:  12/06/19 151 lb (68.5 kg)  05/22/19 164 lb (74.4 kg)  02/14/17 180 lb (81.6 kg)    Health Maintenance Due  Topic Date Due  . HEMOGLOBIN A1C  1940/12/10  . FOOT EXAM  07/20/1949  . OPHTHALMOLOGY EXAM  07/20/1949  . URINE MICROALBUMIN  07/20/1949  . TETANUS/TDAP  07/20/1958  . PNA vac Low Risk Adult (1 of 2 - PCV13) 07/20/2004  . INFLUENZA VACCINE  05/19/2019    There are no preventive care reminders to display for this patient.   No results found for: TSH Lab Results  Component Value Date   HGB 16.3 02/14/2017   HCT 48.0 02/14/2017   Lab Results  Component Value Date   NA 142 02/14/2017   K 5.2 (H) 02/14/2017   GLUCOSE 138 (H) 02/14/2017   BUN 31 (H) 02/14/2017   CREATININE 1.60 (H) 02/14/2017   No results found for: CHOL No results found for: HDL No results found for: LDLCALC No results found for: TRIG No results found for: CHOLHDL No results found for: 02/16/2017     Assessment &  Plan:   Problem List Items Addressed This Visit      Cardiovascular and Mediastinum   Coronary artery disease involving coronary bypass graft of native heart    AN INDIVIDUAL CARE PLAN was established and reinforced today.  The patient's status was assessed using clinical findings on exam, labs, and other diagnostic testing. Patient's success at meeting treatment goals based on disease specific evidence-bassed guidelines and found to be in good control. RECOMMENDATIONS include maintaining present medicines and treatment. He continues to see cardiology and no angina.      Essential hypertension - Primary    An individual care plan was established and reinforced today.  The patient's status was assessed using clinical findings on exam and labs or diagnostic tests. The patient's success at meeting treatment goals on disease specific evidence-based guidelines and found to be good controlled. SELF MANAGEMENT: The patient and I together assessed ways to personally work towards obtaining the recommended goals. RECOMMENDATIONS: avoid decongestants found in common cold remedies, decrease consumption of alcohol, perform routine monitoring of BP with home BP cuff, exercise, reduction of dietary salt, take medicines as prescribed, try not to miss doses and quit smoking.  Regular exercise and maintaining a healthy weight is needed.  Stress reduction may help. A CLINICAL SUMMARY including written plan identify barriers to care unique to individual due to social or financial issues.  We attempt to mutually creat solutions for individual and family understanding.        Endocrine   Acquired hypothyroidism    AN INDIVIDUAL CARE PLAN was established and reinforced today.  The patient's status was assessed using clinical findings on exam, labs, and other diagnostic testing. Patient's success  at meeting treatment goals based on disease specific evidence-bassed guidelines and found to be in good  control. RECOMMENDATIONS include maintaining present medicines and treatment.      Relevant Medications   levothyroxine (SYNTHROID) 100 MCG tablet   Secondary hyperparathyroidism, non-renal (HCC)    AN INDIVIDUAL CARE PLAN was established and reinforced today.  The patient's status was assessed using clinical findings on exam, labs, and other diagnostic testing. Patient's success at meeting treatment goals based on disease specific evidence-bassed guidelines and found to be in good control. RECOMMENDATIONS include maintaing present medicines and treatment.      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  The patient's status was assessed using clinical findings on exam, labs and diagnostic testing. Patient success at meeting goals based on disease specific evidence-based guidelines and found to be good controlled. Medications were assessed and patient's understanding of the medical issues , including barriers were assessed. Recommend adherence to a diabetic diet, a graduated exercise program, HgbA1c level is checked quarterly, and urine microalbumin performed yearly .  Annual mono-filament sensation testing performed. Lower blood pressure and control hyperlipidemia is important. Get annual eye exams and annual flu shots and smoking cessation discussed.  Self management goals were discussed.        Genitourinary   CKD (chronic kidney disease) stage 3, GFR 30-59 ml/min    AN INDIVIDUAL CARE PLAN was established and reinforced today.  The patient's status was assessed using clinical findings on exam, labs, and other diagnostic testing. Patient's success at meeting treatment goals based on disease specific evidence-bassed guidelines and found to be in good control. RECOMMENDATIONS include maintaing present medicines and treatment. We are watching his potassium and patient sees nephrology      BPH with urinary obstruction    AN INDIVIDUAL CARE  PLAN was established and reinforced today.  The patient's status was assessed using clinical findings on exam, labs, and other diagnostic testing. Patient's success at meeting treatment goals based on disease specific evidence-bassed guidelines and found to be in good control. RECOMMENDATIONS include maiantaing present medicines and treatment. No obstructive symptoms        Other   Hyperlipemia, retention    AN INDIVIDUAL CARE PLAN was established and reinforced today.  The patient's status was assessed using clinical findings on exam, lab and other diagnostic tests. The patient's disease status was assessed based on evidence-based guidelines and found to be good controlled. MEDICATIONS were reviewed. SELF MANAGEMENT GOALS have been discussed and patient's success at attaining the goal of low cholesterol was assessed. RECOMMENDATION given include regular exercise 3 days a week and low cholesterol/low fat diet. CLINICAL SUMMARY including written plan to identify barriers unique to the patient due to social or economic  reasons was discussed.       visit 20 minutes + 5 minute review of old records   No orders of the defined types were placed in this encounter.    Brent Bulla, MD

## 2019-12-06 NOTE — Assessment & Plan Note (Signed)
AN INDIVIDUAL CARE PLAN was established and reinforced today.  The patient's status was assessed using clinical findings on exam, labs, and other diagnostic testing. Patient's success at meeting treatment goals based on disease specific evidence-bassed guidelines and found to be in good control. RECOMMENDATIONS include maiantaing present medicines and treatment. No obstructive symptoms

## 2019-12-06 NOTE — Assessment & Plan Note (Signed)

## 2019-12-06 NOTE — Assessment & Plan Note (Signed)
AN INDIVIDUAL CARE PLAN was established and reinforced today.  The patient's status was assessed using clinical findings on exam, labs, and other diagnostic testing. Patient's success at meeting treatment goals based on disease specific evidence-bassed guidelines and found to be in good control. RECOMMENDATIONS include maintaing present medicines and treatment. 

## 2019-12-06 NOTE — Assessment & Plan Note (Signed)
AN INDIVIDUAL CARE PLAN was established and reinforced today.  The patient's status was assessed using clinical findings on exam, labs, and other diagnostic testing. Patient's success at meeting treatment goals based on disease specific evidence-bassed guidelines and found to be in good control. RECOMMENDATIONS include maintaining present medicines and treatment. 

## 2019-12-06 NOTE — Assessment & Plan Note (Signed)
AN INDIVIDUAL CARE PLAN was established and reinforced today.  The patient's status was assessed using clinical findings on exam, labs, and other diagnostic testing. Patient's success at meeting treatment goals based on disease specific evidence-bassed guidelines and found to be in good control. RECOMMENDATIONS include maintaing present medicines and treatment. We are watching his potassium and patient sees nephrology

## 2019-12-06 NOTE — Assessment & Plan Note (Signed)
AN INDIVIDUAL CARE PLAN was established and reinforced today.  The patient's status was assessed using clinical findings on exam, lab and other diagnostic tests. The patient's disease status was assessed based on evidence-based guidelines and found to be good controlled. MEDICATIONS were reviewed. SELF MANAGEMENT GOALS have been discussed and patient's success at attaining the goal of low cholesterol was assessed. RECOMMENDATION given include regular exercise 3 days a week and low cholesterol/low fat diet. CLINICAL SUMMARY including written plan to identify barriers unique to the patient due to social or economic  reasons was discussed. 

## 2019-12-06 NOTE — Assessment & Plan Note (Signed)
AN INDIVIDUAL CARE PLAN was established and reinforced today.  The patient's status was assessed using clinical findings on exam, labs, and other diagnostic testing. Patient's success at meeting treatment goals based on disease specific evidence-bassed guidelines and found to be in good control. RECOMMENDATIONS include maintaining present medicines and treatment. He continues to see cardiology and no angina.

## 2020-01-10 ENCOUNTER — Other Ambulatory Visit: Payer: Self-pay | Admitting: Legal Medicine

## 2020-01-11 ENCOUNTER — Other Ambulatory Visit: Payer: Self-pay

## 2020-01-11 DIAGNOSIS — E1121 Type 2 diabetes mellitus with diabetic nephropathy: Secondary | ICD-10-CM

## 2020-01-11 MED ORDER — ACCU-CHEK AVIVA PLUS VI STRP: 1.0000 | ORAL_STRIP | 2 refills | Status: DC | PRN

## 2020-01-15 ENCOUNTER — Other Ambulatory Visit: Payer: Self-pay

## 2020-01-15 DIAGNOSIS — E1121 Type 2 diabetes mellitus with diabetic nephropathy: Secondary | ICD-10-CM

## 2020-01-15 MED ORDER — ACCU-CHEK AVIVA PLUS VI STRP: 1.0000 | ORAL_STRIP | 2 refills | Status: DC | PRN

## 2020-01-22 ENCOUNTER — Other Ambulatory Visit: Payer: Self-pay

## 2020-01-22 DIAGNOSIS — E1121 Type 2 diabetes mellitus with diabetic nephropathy: Secondary | ICD-10-CM

## 2020-01-22 MED ORDER — ACCU-CHEK AVIVA PLUS VI STRP: 1.0000 | ORAL_STRIP | Freq: Two times a day (BID) | 2 refills | Status: DC

## 2020-04-02 DIAGNOSIS — R309 Painful micturition, unspecified: Secondary | ICD-10-CM | POA: Diagnosis not present

## 2020-04-02 DIAGNOSIS — E875 Hyperkalemia: Secondary | ICD-10-CM | POA: Diagnosis not present

## 2020-04-02 DIAGNOSIS — D649 Anemia, unspecified: Secondary | ICD-10-CM | POA: Diagnosis not present

## 2020-04-02 DIAGNOSIS — N189 Chronic kidney disease, unspecified: Secondary | ICD-10-CM | POA: Diagnosis not present

## 2020-04-02 DIAGNOSIS — E211 Secondary hyperparathyroidism, not elsewhere classified: Secondary | ICD-10-CM | POA: Diagnosis not present

## 2020-04-02 DIAGNOSIS — E559 Vitamin D deficiency, unspecified: Secondary | ICD-10-CM | POA: Diagnosis not present

## 2020-04-02 DIAGNOSIS — E1169 Type 2 diabetes mellitus with other specified complication: Secondary | ICD-10-CM | POA: Diagnosis not present

## 2020-04-02 DIAGNOSIS — R809 Proteinuria, unspecified: Secondary | ICD-10-CM | POA: Diagnosis not present

## 2020-04-02 DIAGNOSIS — I10 Essential (primary) hypertension: Secondary | ICD-10-CM | POA: Diagnosis not present

## 2020-04-02 DIAGNOSIS — E039 Hypothyroidism, unspecified: Secondary | ICD-10-CM | POA: Diagnosis not present

## 2020-04-02 DIAGNOSIS — N200 Calculus of kidney: Secondary | ICD-10-CM | POA: Diagnosis not present

## 2020-04-10 DIAGNOSIS — R339 Retention of urine, unspecified: Secondary | ICD-10-CM | POA: Diagnosis not present

## 2020-04-10 DIAGNOSIS — N281 Cyst of kidney, acquired: Secondary | ICD-10-CM | POA: Diagnosis not present

## 2020-04-10 DIAGNOSIS — N401 Enlarged prostate with lower urinary tract symptoms: Secondary | ICD-10-CM | POA: Diagnosis not present

## 2020-04-11 ENCOUNTER — Other Ambulatory Visit: Payer: Self-pay | Admitting: Legal Medicine

## 2020-04-15 ENCOUNTER — Other Ambulatory Visit: Payer: Self-pay

## 2020-04-15 DIAGNOSIS — R339 Retention of urine, unspecified: Secondary | ICD-10-CM

## 2020-04-15 DIAGNOSIS — N401 Enlarged prostate with lower urinary tract symptoms: Secondary | ICD-10-CM

## 2020-04-17 ENCOUNTER — Other Ambulatory Visit: Payer: Self-pay

## 2020-04-17 DIAGNOSIS — R339 Retention of urine, unspecified: Secondary | ICD-10-CM

## 2020-04-17 MED ORDER — BETHANECHOL CHLORIDE 5 MG PO TABS: 5.0000 mg | ORAL_TABLET | Freq: Two times a day (BID) | ORAL | 4 refills | Status: DC

## 2020-04-18 ENCOUNTER — Encounter: Payer: Self-pay | Admitting: Legal Medicine

## 2020-04-18 ENCOUNTER — Ambulatory Visit (INDEPENDENT_AMBULATORY_CARE_PROVIDER_SITE_OTHER): Payer: Medicare PPO | Admitting: Legal Medicine

## 2020-04-18 ENCOUNTER — Other Ambulatory Visit: Payer: Self-pay

## 2020-04-18 VITALS — BP 130/62 | HR 64 | Temp 97.3°F | Ht 69.0 in | Wt 158.0 lb

## 2020-04-18 DIAGNOSIS — I25708 Atherosclerosis of coronary artery bypass graft(s), unspecified, with other forms of angina pectoris: Secondary | ICD-10-CM | POA: Diagnosis not present

## 2020-04-18 DIAGNOSIS — Z6823 Body mass index (BMI) 23.0-23.9, adult: Secondary | ICD-10-CM

## 2020-04-18 DIAGNOSIS — I10 Essential (primary) hypertension: Secondary | ICD-10-CM

## 2020-04-18 DIAGNOSIS — N1831 Chronic kidney disease, stage 3a: Secondary | ICD-10-CM

## 2020-04-18 DIAGNOSIS — E782 Mixed hyperlipidemia: Secondary | ICD-10-CM

## 2020-04-18 DIAGNOSIS — E1121 Type 2 diabetes mellitus with diabetic nephropathy: Secondary | ICD-10-CM | POA: Diagnosis not present

## 2020-04-18 DIAGNOSIS — E039 Hypothyroidism, unspecified: Secondary | ICD-10-CM

## 2020-04-18 MED ORDER — METOPROLOL TARTRATE 25 MG PO TABS: 25.0000 mg | ORAL_TABLET | Freq: Two times a day (BID) | ORAL | 2 refills | Status: DC

## 2020-04-18 NOTE — Progress Notes (Signed)
Subjective:  Patient ID: David Baxter, male    DOB: [birth year]  Age: 79 y.o. MRN: [id number]  Chief Complaint  Patient presents with  . Hyperlipidemia  . Hypertension  . Hypothyroidism  . Diabetes    HPI: chronic visit.  Patient present with type 2 diabetes.  Specifically, this is type 2, noninsulin requiring diabetes, complicated by renal failure.  Compliance with treatment has been good; patient take medicines as directed, maintains diet and exercise regimen, follows up as directed, and is keeping glucose diary.  Date of  diagnosis 2010.  Depression screen has been performed.Tobacco screen negative Current medicines for diabetes metformin.  Patient is on none for renal protection and simvastatin for cholesterol control.  Patient performs foot exams daily and last ophthalmologic exam was last year.  Patient presents for follow up of hypertension.  Patient tolerating metoprolol well with side effects.  Patient was diagnosed with hypertension 2010 so has been treated for hypertension for 10 years.Patient is working on maintaining diet and exercise regimen and follows up as directed. Complication include none.  Patient presents with hyperlipidemia.  Compliance with treatment has been good; patient takes medicines as directed, maintains low cholesterol diet, follows up as directed, and maintains exercise regimen.  Patient is using simvaTATIN without problems.  Patient has HYPOTHYROIDISM.  Diagnosed 10 years ago.  Patient has stabe thyroid readings.  Patient is having stable.  Last TSH was normal.     Current Outpatient Medications on File Prior to Visit  Medication Sig Dispense Refill  . ACCU-CHEK AVIVA PLUS test strip 1 each by Other route 2 (two) times daily. Use as instructed 100 each 2  . Accu-Chek Softclix Lancets lancets     . allopurinol (ZYLOPRIM) 100 MG tablet TAKE 1 TABLET EVERY DAY 90 tablet 2  . aspirin EC 81 MG tablet Take 81 mg by mouth daily.    . bethanechol (URECHOLINE) 5  MG tablet Take 1 tablet (5 mg total) by mouth 2 (two) times daily. 90 tablet 4  . Cholecalciferol (VITAMIN D-1000 MAX ST) 25 MCG (1000 UT) tablet Take by mouth.    . COLCRYS 0.6 MG tablet     . finasteride (PROSCAR) 5 MG tablet Take 5 mg by mouth every evening.    . Ginkgo Biloba (GINKOBA PO) Take by mouth.    . levothyroxine (SYNTHROID) 100 MCG tablet Take 100 mcg by mouth daily before breakfast.    . LOKELMA 10 g PACK packet 2 (two) times daily.    . metformin (FORTAMET) 500 MG (OSM) 24 hr tablet Take 500 mg by mouth 2 (two) times daily with a meal.    . simvastatin (ZOCOR) 40 MG tablet TAKE 1/2 TABLET EVERY DAY 45 tablet 2  . sodium bicarbonate 650 MG tablet Take by mouth 2 (two) times daily.    . vitamin B-12 (CYANOCOBALAMIN) 1000 MCG tablet Take 1,000 mcg by mouth daily.     No current facility-administered medications on file prior to visit.   Past Medical History:  Diagnosis Date  . Arthralgia of both lower legs   . BPH (benign prostatic hyperplasia)   . CAD (coronary artery disease)    cardiologist--  dr Dulce Sellar w/ [facility] in [place]  . CKD (chronic kidney disease), stage III   . History of gout   . History of kidney stones   . History of stroke    per pt/ wife -- unknown when-- told this was on MRI in 2017  . HOH (hard of  hearing)   . Hyperlipidemia   . Hypertension   . Hypothyroidism   . S/P drug eluting coronary stent placement    2012-- x1DES to OM:  05/ 2016  x2 to CFX   Past Surgical History:  Procedure Laterality Date  . CORONARY ANGIOPLASTY WITH STENT PLACEMENT  03/04/2015   x2 DES to CFX  . CORONARY ANGIOPLASTY WITH STENT PLACEMENT  2012   x1 DES to OM  . CYSTOSCOPY WITH INSERTION OF UROLIFT N/A 02/14/2017   Procedure: CYSTOSCOPY WITH INSERTION OF UROLIFT;  Surgeon: Malen Gauze, MD;  Location: [facility];  Service: Urology;  Laterality: N/A;  . EXTRACORPOREAL SHOCK WAVE LITHOTRIPSY  1990's  . PERCUTANEOUS NEPHROSTOLITHOTOMY   1991   and abdominal hernia repair  . URETEROLITHOTOMY  1991 and 1994    Family History  Problem Relation Age of Onset  . Heart disease Mother 33       After age 84 - CHF  . Hypertension Father   . Heart disease Father 44       After age 56 - CHF   Social History   Socioeconomic History  . Marital status: Married    Spouse name: Not on file  . Number of children: Not on file  . Years of education: Not on file  . Highest education level: Not on file  Occupational History  . Occupation: truck Hospital doctor  Tobacco Use  . Smoking status: Never Smoker  . Smokeless tobacco: Never Used  Substance and Sexual Activity  . Alcohol use: No  . Drug use: No  . Sexual activity: Not on file  Other Topics Concern  . Not on file  Social History Narrative  . Not on file   Social Determinants of Health   Financial Resource Strain:   . Difficulty of Paying Living Expenses:   Food Insecurity:   . Worried About Programme researcher, broadcasting/film/video in the Last Year:   . Barista in the Last Year:   Transportation Needs:   . Freight forwarder (Medical):   Marland Kitchen Lack of Transportation (Non-Medical):   Physical Activity:   . Days of Exercise per Week:   . Minutes of Exercise per Session:   Stress:   . Feeling of Stress :   Social Connections:   . Frequency of Communication with Friends and Family:   . Frequency of Social Gatherings with Friends and Family:   . Attends Religious Services:   . Active Member of Clubs or Organizations:   . Attends Banker Meetings:   Marland Kitchen Marital Status:     Review of Systems  Constitutional: Negative.   HENT: Negative.   Eyes: Negative.   Respiratory: Negative.   Cardiovascular: Negative.   Gastrointestinal: Negative.   Endocrine: Negative.   Genitourinary: Negative.   Musculoskeletal: Positive for arthralgias.  Skin: Negative.   Neurological: Negative.   Psychiatric/Behavioral: Negative.      Objective:  BP 130/62   Pulse 64   Temp (!)  97.3 F (36.3 C)   Ht 5\' 9"  (1.753 m)   Wt 158 lb (71.7 kg)   SpO2 98%   BMI 23.33 kg/m   BP/Weight 04/18/2020 12/06/2019 05/22/2019  Systolic BP 130 145 90  Diastolic BP 62 82 60  Wt. (Lbs) 158 151 164  BMI 23.33 22.3 24.22    Physical Exam Vitals reviewed.  Constitutional:      Appearance: Normal appearance.  HENT:     Head: Normocephalic and atraumatic.  Right Ear: Tympanic membrane, ear canal and external ear normal.     Left Ear: Tympanic membrane, ear canal and external ear normal.     Nose: Nose normal.     Mouth/Throat:     Mouth: Mucous membranes are dry.  Eyes:     Extraocular Movements: Extraocular movements intact.     Conjunctiva/sclera: Conjunctivae normal.     Pupils: Pupils are equal, round, and reactive to light.  Cardiovascular:     Rate and Rhythm: Normal rate and regular rhythm.     Pulses: Normal pulses.     Heart sounds: Normal heart sounds.  Pulmonary:     Effort: Pulmonary effort is normal.     Breath sounds: Normal breath sounds.  Abdominal:     General: Abdomen is flat. Bowel sounds are normal.     Palpations: Abdomen is soft.  Musculoskeletal:        General: Normal range of motion.     Cervical back: Normal range of motion and neck supple.  Skin:    General: Skin is warm.     Capillary Refill: Capillary refill takes less than 2 seconds.  Neurological:     General: No focal deficit present.     Mental Status: He is alert and oriented to person, place, and time.       Lab Results  Component Value Date   HGB 16.3 02/14/2017   HCT 48.0 02/14/2017   GLUCOSE 138 (H) 02/14/2017   NA 142 02/14/2017   K 5.2 (H) 02/14/2017   CL 107 02/14/2017   CREATININE 1.60 (H) 02/14/2017   BUN 31 (H) 02/14/2017      Assessment & Plan:   1. Mixed hyperlipidemia - Lipid panel AN INDIVIDUAL CARE PLAN for hyperlipidemia/ cholesterol was established and reinforced today.  The patient's status was assessed using clinical findings on exam, lab and  other diagnostic tests. The patient's disease status was assessed based on evidence-based guidelines and found to be well controlled. MEDICATIONS were revoewed. SELF MANAGEMENT GOALS have been discussed and patient's success at attaining the goal of low cholesterol was assessed. RECOMMENDATION given include regular exercise 3 days a week and low cholesterol/low fat diet. CLINICAL SUMMARY including written plan to identify barriers unique to the patient due to social or economic  reasons was discussed.  2. Acquired hypothyroidism - TSH Patient is known to have hypothyroid and is on treatment with none.  Patient was diagnosed 10 years ago.  Other treatment includes none.  Patient is compliant with medicines and last TSH 6 months ago.  Last TSH was normal.  3. Essential hypertension - CBC with Differential/Platelet - Comprehensive metabolic panel - metoprolol tartrate (LOPRESSOR) 25 MG tablet; Take 1 tablet (25 mg total) by mouth 2 (two) times daily.  Dispense: 180 tablet; Refill: 2 An individual hypertension care plan was established and reinforced today.  The patient's status was assessed using clinical findings on exam and labs or diagnostic tests. The patient's success at meeting treatment goals on disease specific evidence-based guidelines and found to be well controlled. SELF MANAGEMENT: The patient and I together assessed ways to personally work towards obtaining the recommended goals. RECOMMENDATIONS: avoid decongestants found in common cold remedies, decrease consumption of alcohol, perform routine monitoring of BP with home BP cuff, exercise, reduction of dietary salt, take medicines as prescribed, try not to miss doses and quit smoking.  Regular exercise and maintaining a healthy weight is needed.  Stress reduction may help. A CLINICAL SUMMARY including written  plan identify barriers to care unique to individual due to social or financial issues.  We attempt to mutually creat solutions for  individual and family understanding.  4. Diabetic glomerulopathy (HCC) - Hemoglobin A1c An individual care plan for diabetes was established and reinforced today.  The patient's status was assessed using clinical findings on exam, labs and diagnostic testing. Patient success at meeting goals based on disease specific evidence-based guidelines and found to be good controlled. Medications were assessed and patient's understanding of the medical issues , including barriers were assessed. Recommend adherence to a diabetic diet, a graduated exercise program, HgbA1c level is checked quarterly, and urine microalbumin performed yearly .  Annual mono-filament sensation testing performed. Lower blood pressure and control hyperlipidemia is important. Get annual eye exams and annual flu shots and smoking cessation discussed.  Self management goals were discussed.  5. Stage 3a chronic kidney disease AN INDIVIDUAL CARE PLAN for chronic kidney disease was established and reinforced today.  The patient's status was assessed using clinical findings on exam, labs, and other diagnostic testing. Patient's success at meeting treatment goals based on disease specific evidence-bassed guidelines and found to be in fair control. RECOMMENDATIONS include maintaining present medicines and treatment.  6. Coronary artery disease of bypass graft of native heart with stable angina pectoris [facility](HCC) An individual plan was formulated based on patient history and exam, labs and evidence based data. Patient hs not had recent angina or nitroglycerin use. continue present treatment. 7. BMI 23.0-23.9, adult Continue present diet and exercise    Meds ordered this encounter  Medications  . metoprolol tartrate (LOPRESSOR) 25 MG tablet    Sig: Take 1 tablet (25 mg total) by mouth 2 (two) times daily.    Dispense:  180 tablet    Refill:  2    Orders Placed This Encounter  Procedures  . CBC with Differential/Platelet  . Comprehensive  metabolic panel  . Lipid panel  . Hemoglobin A1c  . TSH     Follow-up: Return in about 4 months (around 08/19/2020), or mcr pe kim, for fasting.  An After Visit Summary was printed and given to the patient.  Brent BullaLawrence Kassadi [facility] [phone]

## 2020-04-21 DIAGNOSIS — Z6823 Body mass index (BMI) 23.0-23.9, adult: Secondary | ICD-10-CM | POA: Insufficient documentation

## 2020-04-21 DIAGNOSIS — Z6822 Body mass index (BMI) 22.0-22.9, adult: Secondary | ICD-10-CM | POA: Insufficient documentation

## 2020-04-21 HISTORY — DX: Body mass index (BMI) 23.0-23.9, adult: Z68.23

## 2020-04-22 ENCOUNTER — Other Ambulatory Visit: Payer: Self-pay

## 2020-04-22 DIAGNOSIS — E875 Hyperkalemia: Secondary | ICD-10-CM

## 2020-04-22 NOTE — Progress Notes (Signed)
CBC normal, kidney tests stable, potassium 5.9 very high, recheck one week, may need kidney consult, liver tests ok, Cholesterol normal, A1c 5.7 lp

## 2020-04-23 ENCOUNTER — Other Ambulatory Visit: Payer: Self-pay

## 2020-04-23 ENCOUNTER — Other Ambulatory Visit: Payer: Medicare PPO

## 2020-04-23 DIAGNOSIS — E875 Hyperkalemia: Secondary | ICD-10-CM

## 2020-04-23 LAB — CBC WITH DIFFERENTIAL/PLATELET
Basophils Absolute: 0.1 10*3/uL (ref 0.0–0.2)
Basos: 1 %
EOS (ABSOLUTE): 0.4 10*3/uL (ref 0.0–0.4)
Eos: 5 %
Hematocrit: 44.3 % (ref 37.5–51.0)
Hemoglobin: 14.3 g/dL (ref 13.0–17.7)
Immature Grans (Abs): 0 10*3/uL (ref 0.0–0.1)
Immature Granulocytes: 0 %
Lymphocytes Absolute: 2.8 10*3/uL (ref 0.7–3.1)
Lymphs: 32 %
MCH: 29 pg (ref 26.6–33.0)
MCHC: 32.3 g/dL (ref 31.5–35.7)
MCV: 90 fL (ref 79–97)
Monocytes Absolute: 1 10*3/uL — ABNORMAL HIGH (ref 0.1–0.9)
Monocytes: 11 %
Neutrophils Absolute: 4.3 10*3/uL (ref 1.4–7.0)
Neutrophils: 51 %
Platelets: 341 10*3/uL (ref 150–450)
RBC: 4.93 x10E6/uL (ref 4.14–5.80)
RDW: 14.2 % (ref 11.6–15.4)
WBC: 8.7 10*3/uL (ref 3.4–10.8)

## 2020-04-23 LAB — COMPREHENSIVE METABOLIC PANEL WITH GFR
ALT: 11 IU/L (ref 0–44)
AST: 17 IU/L (ref 0–40)
Albumin/Globulin Ratio: 1.8 (ref 1.2–2.2)
Albumin: 4.4 g/dL (ref 3.7–4.7)
Alkaline Phosphatase: 86 IU/L (ref 48–121)
BUN/Creatinine Ratio: 15 (ref 10–24)
BUN: 23 mg/dL (ref 8–27)
Bilirubin Total: 0.4 mg/dL (ref 0.0–1.2)
CO2: 29 mmol/L (ref 20–29)
Calcium: 9.4 mg/dL (ref 8.6–10.2)
Chloride: 103 mmol/L (ref 96–106)
Creatinine, Ser: 1.58 mg/dL — ABNORMAL HIGH (ref 0.76–1.27)
GFR calc Af Amer: 47 mL/min/1.73 — ABNORMAL LOW (ref >59)
GFR calc non Af Amer: 41 mL/min/1.73 — ABNORMAL LOW (ref >59)
Globulin, Total: 2.4 g/dL (ref 1.5–4.5)
Glucose: 97 mg/dL (ref 65–99)
Potassium: 5.9 mmol/L — ABNORMAL HIGH (ref 3.5–5.2)
Sodium: 142 mmol/L (ref 134–144)
Total Protein: 6.8 g/dL (ref 6.0–8.5)

## 2020-04-23 LAB — LIPID PANEL
Chol/HDL Ratio: 3 ratio (ref 0.0–5.0)
Cholesterol, Total: 131 mg/dL (ref 100–199)
HDL: 43 mg/dL (ref >39)
LDL Chol Calc (NIH): 71 mg/dL (ref 0–99)
Triglycerides: 89 mg/dL (ref 0–149)
VLDL Cholesterol Cal: 17 mg/dL (ref 5–40)

## 2020-04-23 LAB — HEMOGLOBIN A1C
Est. average glucose Bld gHb Est-mCnc: 117 mg/dL
Hgb A1c MFr Bld: 5.7 % — ABNORMAL HIGH (ref 4.8–5.6)

## 2020-04-23 LAB — TSH: TSH: 2.16 u[IU]/mL (ref 0.450–4.500)

## 2020-04-23 LAB — CARDIOVASCULAR RISK ASSESSMENT

## 2020-04-24 ENCOUNTER — Other Ambulatory Visit: Payer: Self-pay | Admitting: Legal Medicine

## 2020-04-24 LAB — COMPREHENSIVE METABOLIC PANEL
ALT: 12 IU/L (ref 0–44)
AST: 20 IU/L (ref 0–40)
Albumin/Globulin Ratio: 1.9 (ref 1.2–2.2)
Albumin: 4.2 g/dL (ref 3.7–4.7)
Alkaline Phosphatase: 84 IU/L (ref 48–121)
BUN/Creatinine Ratio: 16 (ref 10–24)
BUN: 28 mg/dL — ABNORMAL HIGH (ref 8–27)
Bilirubin Total: 0.4 mg/dL (ref 0.0–1.2)
CO2: 28 mmol/L (ref 20–29)
Calcium: 9.6 mg/dL (ref 8.6–10.2)
Chloride: 104 mmol/L (ref 96–106)
Creatinine, Ser: 1.79 mg/dL — ABNORMAL HIGH (ref 0.76–1.27)
GFR calc Af Amer: 40 mL/min/{1.73_m2} — ABNORMAL LOW (ref >59)
GFR calc non Af Amer: 35 mL/min/{1.73_m2} — ABNORMAL LOW (ref >59)
Globulin, Total: 2.2 g/dL (ref 1.5–4.5)
Glucose: 97 mg/dL (ref 65–99)
Potassium: 6 mmol/L — ABNORMAL HIGH (ref 3.5–5.2)
Sodium: 143 mmol/L (ref 134–144)
Total Protein: 6.4 g/dL (ref 6.0–8.5)

## 2020-04-24 MED ORDER — SODIUM POLYSTYRENE SULFONATE 15 GM/60ML PO SUSP: 15.0000 g | Freq: Every day | ORAL | 0 refills | Status: AC

## 2020-04-24 NOTE — Progress Notes (Signed)
Kidney tests still stage 3, potassium still high at 6.0, liver tests normal, take kayexalate (called in)for 4 days and recheck potassium in one week lp

## 2020-05-01 ENCOUNTER — Other Ambulatory Visit: Payer: Self-pay | Admitting: Legal Medicine

## 2020-05-08 ENCOUNTER — Other Ambulatory Visit: Payer: Self-pay

## 2020-05-08 ENCOUNTER — Ambulatory Visit (INDEPENDENT_AMBULATORY_CARE_PROVIDER_SITE_OTHER): Payer: Medicare PPO

## 2020-05-08 ENCOUNTER — Telehealth: Payer: Self-pay | Admitting: Legal Medicine

## 2020-05-08 VITALS — BP 120/78 | HR 58 | Temp 97.9°F | Resp 16 | Ht 69.0 in | Wt 158.0 lb

## 2020-05-08 DIAGNOSIS — E875 Hyperkalemia: Secondary | ICD-10-CM | POA: Diagnosis not present

## 2020-05-08 DIAGNOSIS — Z0000 Encounter for general adult medical examination without abnormal findings: Secondary | ICD-10-CM | POA: Diagnosis not present

## 2020-05-08 NOTE — Progress Notes (Signed)
  Chronic Care Management   Note  05/08/2020 Name: TOREN TUCHOLSKI MRN: [id number] DOB: 21-Nov-1940  Nobie Putnam Isadore is a 79 y.o. year old male who is a primary care patient of Abigail Miyamoto, MD. I reached out to [name] by phone today in response to a referral sent by Mr. Deniro Laymon Lukic's PCP, Abigail Miyamoto, MD.   Mr. Cuadra was given information about Chronic Care Management services today including:  1. CCM service includes personalized support from designated clinical staff supervised by his physician, including individualized plan of care and coordination with other care providers 2. 24/7 contact phone numbers for assistance for urgent and routine care needs. 3. Service will only be billed when office clinical staff spend 20 minutes or more in a month to coordinate care. 4. Only one practitioner may furnish and bill the service in a calendar month. 5. The patient may stop CCM services at any time (effective at the end of the month) by phone call to the office staff.   Patient agreed to services and verbal consent obtained.   Follow up plan:   Lynnae January [employer] Scheduler

## 2020-05-09 ENCOUNTER — Other Ambulatory Visit: Payer: Self-pay | Admitting: Legal Medicine

## 2020-05-09 DIAGNOSIS — E875 Hyperkalemia: Secondary | ICD-10-CM | POA: Insufficient documentation

## 2020-05-09 HISTORY — DX: Hyperkalemia: E87.5

## 2020-05-09 LAB — POTASSIUM: Potassium: 5.8 mmol/L — ABNORMAL HIGH (ref 3.5–5.2)

## 2020-05-09 NOTE — Progress Notes (Signed)
Potassium still high- nephrology referral, stop sodium bicarbonate lp

## 2020-05-11 NOTE — Progress Notes (Signed)
Subjective:   KIPPY GOHMAN is a 79 y.o. male who presents for Medicare Annual/Subsequent preventive examination.  This wellness visit is conducted by a nurse.  The patient's medications were reviewed and reconciled since the patient's last visit.  History details were provided by the patient.  The history appears to be reliable.    Patient's last AWV was one year ago.   Medical History: Patient history and Family history was reviewed  Medications, Allergies, and preventative health maintenance was reviewed and updated.   Review of Systems    Review of Systems  Constitutional: Negative.   HENT: Negative.   Eyes: Negative.   Respiratory: Negative.  Negative for cough, chest tightness and shortness of breath.   Cardiovascular: Negative.  Negative for chest pain and palpitations.  Genitourinary: Negative.   Musculoskeletal: Negative.   Psychiatric/Behavioral: Negative.  Negative for confusion, sleep disturbance and suicidal ideas.   Cardiac Risk Factors include: advanced age (>40men, >80 women);diabetes mellitus;male gender     Objective:    Today's Vitals   05/08/20 1839  BP: 120/78  Pulse: 58  Resp: 16  Temp: 97.9 F (36.6 C)  TempSrc: Temporal  SpO2: 98%  Weight: 158 lb (71.7 kg)  Height: 5\' 9"  (1.753 m)  PainSc: 0-No pain   Body mass index is 23.33 kg/m.  Advanced Directives 05/11/2020 02/14/2017 12/01/2015  Does Patient Have a Medical Advance Directive? Yes Yes No  Type of 12/03/2015 of [place];Living will Living will -  Does patient want to make changes to medical advance directive? No - Patient declined No - Patient declined -  Copy of Healthcare Power of Attorney in Chart? No - copy requested - -    Current Medications (verified) Outpatient Encounter Medications as of 05/08/2020  Medication Sig  . ACCU-CHEK AVIVA PLUS test strip 1 each by Other route 2 (two) times daily. Use as instructed  . Accu-Chek Softclix Lancets lancets   .  allopurinol (ZYLOPRIM) 100 MG tablet TAKE 1 TABLET EVERY DAY  . aspirin EC 81 MG tablet Take 81 mg by mouth daily.  . bethanechol (URECHOLINE) 5 MG tablet Take 1 tablet (5 mg total) by mouth 2 (two) times daily.  . Cholecalciferol (VITAMIN D-1000 MAX ST) 25 MCG (1000 UT) tablet Take by mouth.  . COLCRYS 0.6 MG tablet   . finasteride (PROSCAR) 5 MG tablet Take 5 mg by mouth every evening.  . Ginkgo Biloba (GINKOBA PO) Take by mouth.  . levothyroxine (SYNTHROID) 100 MCG tablet Take 100 mcg by mouth daily before breakfast.  . LOKELMA 10 g PACK packet 2 (two) times daily.  . metformin (FORTAMET) 500 MG (OSM) 24 hr tablet Take 500 mg by mouth 2 (two) times daily with a meal.  . metFORMIN (GLUCOPHAGE) 500 MG tablet TAKE 1 TABLET TWICE DAILY  . metoprolol tartrate (LOPRESSOR) 25 MG tablet Take 1 tablet (25 mg total) by mouth 2 (two) times daily.  . simvastatin (ZOCOR) 40 MG tablet TAKE 1/2 TABLET EVERY DAY  . sodium bicarbonate 650 MG tablet Take by mouth 2 (two) times daily.  . vitamin B-12 (CYANOCOBALAMIN) 1000 MCG tablet Take 1,000 mcg by mouth daily.   No facility-administered encounter medications on file as of 05/08/2020.    Allergies (verified) Aspirin   History: Past Medical History:  Diagnosis Date  . Arthralgia of both lower legs   . BPH (benign prostatic hyperplasia)   . CAD (coronary artery disease)    cardiologist--  dr 05/10/2020 w/ [facility]  [place]  . CKD (chronic kidney disease), stage III   . History of gout   . History of kidney stones   . History of stroke    per pt/ wife -- unknown when-- told this was on MRI in 2017  . HOH (hard of hearing)   . Hyperlipidemia   . Hypertension   . Hypothyroidism   . S/P drug eluting coronary stent placement    2012-- x1DES to OM:  05/ 2016  x2 to CFX   Past Surgical History:  Procedure Laterality Date  . CORONARY ANGIOPLASTY WITH STENT PLACEMENT  03/04/2015   x2 DES to CFX  . CORONARY ANGIOPLASTY WITH STENT  PLACEMENT  2012   x1 DES to OM  . CYSTOSCOPY WITH INSERTION OF UROLIFT N/A 02/14/2017   Procedure: CYSTOSCOPY WITH INSERTION OF UROLIFT;  Surgeon: Malen GauzePatrick L McKenzie, MD;  [facility];  Service: Urology;  Laterality: N/A;  . EXTRACORPOREAL SHOCK WAVE LITHOTRIPSY  1990's  . PERCUTANEOUS NEPHROSTOLITHOTOMY  1991   and abdominal hernia repair  . URETEROLITHOTOMY  1991 and 1994   Family History  Problem Relation Age of Onset  . Heart disease Mother [id number]       After age 81 - CHF  . Hypertension Father   . Heart disease Father [id number]       After age 81 - CHF   Social History   Socioeconomic History  . Marital status: Married    Spouse name: Not on file  . Number of children: Not on file  . Years of education: Not on file  . Highest education level: Not on file  Occupational History  . 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 Smoking status: Never Smoker  . Smokeless tobacco: Never Used  Substance and Sexual Activity  . Alcohol use: No  . Drug use: No  . Sexual activity: Not on file  Other Topics Concern  . Not on file  Social History Narrative  . Not on file   Social Determinants of Health   Financial Resource Strain:   . Difficulty of Paying Living Expenses:   Food Insecurity:   . Worried About Programme researcher, broadcasting/film/videounning Out of Food in the Last Year:   . Baristaan Out of Food in the Last Year:   Transportation Needs:   . Freight forwarderLack of Transportation (Medical):   Marland Kitchen. Lack of Transportation (Non-Medical):   Physical Activity:   . Days of Exercise per Week:   . Minutes of Exercise per Session:   Stress:   . Feeling of Stress :   Social Connections:   . Frequency of Communication with Friends and Family:   . Frequency of Social Gatherings with Friends and Family:   . Attends Religious Services:   . Active Member of Clubs or Organizations:   . Attends BankerClub or Organization Meetings:   Marland Kitchen. Marital Status:     Tobacco Counseling Counseling given: Not Answered   Clinical  Intake:  Pre-visit preparation completed: Yes  Pain : No/denies pain Pain Score: 0-No pain     BMI - recorded: 23.33 Nutritional Status: BMI of 19-24  Normal Nutritional Risks: None Diabetes: Yes CBG done?: No Did pt. bring in CBG monitor from home?: No  How often do you need to have someone help you when you read instructions, pamphlets, or other written materials from your doctor or pharmacy?: 1 - Never           Activities of Daily Living In your present state  of health, do you have any difficulty performing the following activities: 05/11/2020  Hearing? N  Vision? N  Difficulty concentrating or making decisions? N  Walking or climbing stairs? N  Dressing or bathing? N  Doing errands, shopping? N  Preparing Food and eating ? N  Using the Toilet? N  In the past six months, have you accidently leaked urine? N  Do you have problems with loss of bowel control? N  Managing your Medications? N  Managing your Finances? N  Housekeeping or managing your Housekeeping? N  Some recent data might be hidden    Patient Care Team: Abigail Miyamoto, MD as PCP - General (Family Medicine) Earvin Hansen, [facility] as Pharmacist (Pharmacist)  Indicate any recent Medical Services you may have received from other than [facility] providers in the past year (date may be approximate).     Assessment:   This is a routine wellness examination for Reynolds.  Hearing/Vision screen No exam data present  Dietary issues and exercise activities discussed: Current Exercise Habits: The patient does not participate in regular exercise at present  Goals   None    Depression Screen PHQ 2/9 Scores 05/11/2020 04/18/2020  PHQ - 2 Score 0 0    Fall Risk Fall Risk  05/11/2020 04/21/2020  Falls in the past year? 0 0  Number falls in past yr: 0 0  Injury with Fall? 0 0  Risk for fall due to : No Fall Risks -  Follow up Falls evaluation completed;Education provided;Falls prevention discussed Falls  evaluation completed    Any stairs in or around the home? Yes  If so, are there any without handrails? No  Home free of loose throw rugs in walkways, pet beds, electrical cords, etc? Yes  Adequate lighting in your home to reduce risk of falls? Yes   ASSISTIVE DEVICES UTILIZED TO PREVENT FALLS:  Life alert? No  Use of a cane, walker or w/c? No  Grab bars in the bathroom? Yes  Shower chair or bench in shower? Yes  Elevated toilet seat or a handicapped toilet? Yes   Cognitive Function:     6CIT Screen 05/11/2020  What Year? 0 points  What month? 0 points  What time? 0 points  Count back from 20 0 points  Months in reverse 0 points  Repeat phrase 0 points  Total Score 0    Immunizations Immunization History  Administered Date(s) Administered  . Moderna SARS-COVID-2 Vaccination 11/22/2019, 12/18/2019     Flu Vaccine status: Up to date Pneumococcal vaccine status: Up to date Covid-19 vaccine status: Completed vaccines  Screening Tests Health Maintenance  Topic Date Due  . FOOT EXAM  Never done  . URINE MICROALBUMIN  Never done  . TETANUS/TDAP  05/11/2021 (Originally 07/20/1960)  . PNA vac Low Risk Adult (1 of 2 - PCV13) 05/11/2021 (Originally 07/20/2004)  . INFLUENZA VACCINE  05/18/2020  . OPHTHALMOLOGY EXAM  09/17/2020  . HEMOGLOBIN A1C  10/19/2020  . COVID-19 Vaccine  Completed    Health Maintenance  Health Maintenance Due  Topic Date Due  . FOOT EXAM  Never done  . URINE MICROALBUMIN  Never done    Colorectal cancer screening: No longer required.   Lung Cancer Screening: (Low Dose CT Chest recommended if Age 68-80 years, 30 pack-year currently smoking OR have quit w/in 15years.) does not qualify.    Additional Screening:  Vision Screening: Recommended annual ophthalmology exams for early detection of glaucoma and other disorders of the eye. Is the  patient up to date with their annual eye exam?  Yes   Dental Screening: Recommended annual dental exams  for proper oral hygiene   Plan:    Counseling was provided today regarding the following topics: healthy eating habits, home safety, vitamin and mineral supplementation (Multivitiman), regular exercise, tobacco avoidance, limitation of alcohol intake, use of seat belts, firearm safety, and fall prevention.  Annual recommendations include: influenza vaccine, dental cleanings, and eye exams.   I have personally reviewed and noted the following in the patient's chart:   . Medical and social history . Use of alcohol, tobacco or illicit drugs  . Current medications and supplements . Functional ability and status . Nutritional status . Physical activity . Advanced directives . List of other physicians . Hospitalizations, surgeries, and ER visits in previous 12 months . Vitals . Screenings to include cognitive, depression, and falls . Referrals and appointments  In addition, I have reviewed and discussed with patient certain preventive protocols, quality metrics, and best practice recommendations. A written personalized care plan for preventive services as well as general preventive health recommendations were provided to patient.     Jacklynn Bue, LPN   [id number]

## 2020-05-11 NOTE — Patient Instructions (Signed)
 Fall Prevention in the Home, Adult Falls can cause injuries. They can happen to people of all ages. There are many things you can do to make your home safe and to help prevent falls. Ask for help when making these changes, if needed. What actions can I take to prevent falls? General Instructions  Use good lighting in all rooms. Replace any light bulbs that burn out.  Turn on the lights when you go into a dark area. Use night-lights.  Keep items that you use often in easy-to-reach places. Lower the shelves around your home if necessary.  Set up your furniture so you have a clear path. Avoid moving your furniture around.  Do not have throw rugs and other things on the floor that can make you trip.  Avoid walking on wet floors.  If any of your floors are uneven, fix them.  Add color or contrast paint or tape to clearly mark and help you see: ? Any grab bars or handrails. ? First and last steps of stairways. ? Where the edge of each step is.  If you use a stepladder: ? Make sure that it is fully opened. Do not climb a closed stepladder. ? Make sure that both sides of the stepladder are locked into place. ? Ask someone to hold the stepladder for you while you use it.  If there are any pets around you, be aware of where they are. What can I do in the bathroom?      Keep the floor dry. Clean up any water that spills onto the floor as soon as it happens.  Remove soap buildup in the tub or shower regularly.  Use non-skid mats or decals on the floor of the tub or shower.  Attach bath mats securely with double-sided, non-slip rug tape.  If you need to sit down in the shower, use a plastic, non-slip stool.  Install grab bars by the toilet and in the tub and shower. Do not use towel bars as grab bars. What can I do in the bedroom?  Make sure that you have a light by your bed that is easy to reach.  Do not use any sheets or blankets that are too big for your bed. They should  not hang down onto the floor.  Have a firm chair that has side arms. You can use this for support while you get dressed. What can I do in the kitchen?  Clean up any spills right away.  If you need to reach something above you, use a strong step stool that has a grab bar.  Keep electrical cords out of the way.  Do not use floor polish or wax that makes floors slippery. If you must use wax, use non-skid floor wax. What can I do with my stairs?  Do not leave any items on the stairs.  Make sure that you have a light switch at the top of the stairs and the bottom of the stairs. If you do not have them, ask someone to add them for you.  Make sure that there are handrails on both sides of the stairs, and use them. Fix handrails that are broken or loose. Make sure that handrails are as long as the stairways.  Install non-slip stair treads on all stairs in your home.  Avoid having throw rugs at the top or bottom of the stairs. If you do have throw rugs, attach them to the floor with carpet tape.  Choose a carpet that   does not hide the edge of the steps on the stairway.  Check any carpeting to make sure that it is firmly attached to the stairs. Fix any carpet that is loose or worn. What can I do on the outside of my home?  Use bright outdoor lighting.  Regularly fix the edges of walkways and driveways and fix any cracks.  Remove anything that might make you trip as you walk through a door, such as a raised step or threshold.  Trim any bushes or trees on the path to your home.  Regularly check to see if handrails are loose or broken. Make sure that both sides of any steps have handrails.  Install guardrails along the edges of any raised decks and porches.  Clear walking paths of anything that might make someone trip, such as tools or rocks.  Have any leaves, snow, or ice cleared regularly.  Use sand or salt on walking paths during winter.  Clean up any spills in your garage right  away. This includes grease or oil spills. What other actions can I take?  Wear shoes that: ? Have a low heel. Do not wear high heels. ? Have rubber bottoms. ? Are comfortable and fit you well. ? Are closed at the toe. Do not wear open-toe sandals.  Use tools that help you move around (mobility aids) if they are needed. These include: ? Canes. ? Walkers. ? Scooters. ? Crutches.  Review your medicines with your doctor. Some medicines can make you feel dizzy. This can increase your chance of falling. Ask your doctor what other things you can do to help prevent falls. Where to find more information  Centers for Disease Control and Prevention, STEADI: https://cdc.gov  National Institute on Aging: https://go4life.nia.nih.gov Contact a doctor if:  You are afraid of falling at home.  You feel weak, drowsy, or dizzy at home.  You fall at home. Summary  There are many simple things that you can do to make your home safe and to help prevent falls.  Ways to make your home safe include removing tripping hazards and installing grab bars in the bathroom.  Ask for help when making these changes in your home. This information is not intended to replace advice given to you by your health care provider. Make sure you discuss any questions you have with your health care provider. Document Revised: 01/25/2019 Document Reviewed: 05/19/2017 Elsevier Patient Education  2020 Elsevier Inc.   Health Maintenance, Male Adopting a healthy lifestyle and getting preventive care are important in promoting health and wellness. Ask your health care provider about:  The right schedule for you to have regular tests and exams.  Things you can do on your own to prevent diseases and keep yourself healthy. What should I know about diet, weight, and exercise? Eat a healthy diet   Eat a diet that includes plenty of vegetables, fruits, low-fat dairy products, and lean protein.  Do not eat a lot of foods  that are high in solid fats, added sugars, or sodium. Maintain a healthy weight Body mass index (BMI) is a measurement that can be used to identify possible weight problems. It estimates body fat based on height and weight. Your health care provider can help determine your BMI and help you achieve or maintain a healthy weight. Get regular exercise Get regular exercise. This is one of the most important things you can do for your health. Most adults should:  Exercise for at least 150 minutes each week. The   exercise should increase your heart rate and make you sweat (moderate-intensity exercise).  Do strengthening exercises at least twice a week. This is in addition to the moderate-intensity exercise.  Spend less time sitting. Even light physical activity can be beneficial. Watch cholesterol and blood lipids Have your blood tested for lipids and cholesterol at 81 years of age, then have this test every 5 years. You may need to have your cholesterol levels checked more often if:  Your lipid or cholesterol levels are high.  You are older than 81 years of age.  You are at high risk for heart disease. What should I know about cancer screening? Many types of cancers can be detected early and may often be prevented. Depending on your health history and family history, you may need to have cancer screening at various ages. This may include screening for:  Colorectal cancer.  Prostate cancer.  Skin cancer.  Lung cancer. What should I know about heart disease, diabetes, and high blood pressure? Blood pressure and heart disease  High blood pressure causes heart disease and increases the risk of stroke. This is more likely to develop in people who have high blood pressure readings, are of African descent, or are overweight.  Talk with your health care provider about your target blood pressure readings.  Have your blood pressure checked: ? Every 3-5 years if you are 18-39 years of  age. ? Every year if you are 40 years old or older.  If you are between the ages of 65 and 75 and are a current or former smoker, ask your health care provider if you should have a one-time screening for abdominal aortic aneurysm (AAA). Diabetes Have regular diabetes screenings. This checks your fasting blood sugar level. Have the screening done:  Once every three years after age 45 if you are at a normal weight and have a low risk for diabetes.  More often and at a younger age if you are overweight or have a high risk for diabetes. What should I know about preventing infection? Hepatitis B If you have a higher risk for hepatitis B, you should be screened for this virus. Talk with your health care provider to find out if you are at risk for hepatitis B infection. Hepatitis C Blood testing is recommended for:  Everyone born from 1945 through 1965.  Anyone with known risk factors for hepatitis C. Sexually transmitted infections (STIs)  You should be screened each year for STIs, including gonorrhea and chlamydia, if: ? You are sexually active and are younger than 81 years of age. ? You are older than 81 years of age and your health care provider tells you that you are at risk for this type of infection. ? Your sexual activity has changed since you were last screened, and you are at increased risk for chlamydia or gonorrhea. Ask your health care provider if you are at risk.  Ask your health care provider about whether you are at high risk for HIV. Your health care provider may recommend a prescription medicine to help prevent HIV infection. If you choose to take medicine to prevent HIV, you should first get tested for HIV. You should then be tested every 3 months for as long as you are taking the medicine. Follow these instructions at home: Lifestyle  Do not use any products that contain nicotine or tobacco, such as cigarettes, e-cigarettes, and chewing tobacco. If you need help quitting,  ask your health care provider.  Do not use street   drugs.  Do not share needles.  Ask your health care provider for help if you need support or information about quitting drugs. Alcohol use  Do not drink alcohol if your health care provider tells you not to drink.  If you drink alcohol: ? Limit how much you have to 0-2 drinks a day. ? Be aware of how much alcohol is in your drink. In the [place] one drink equals one 12 oz bottle of beer (355 mL), one 5 oz glass of wine (148 mL), or one 1 oz glass of hard liquor (44 mL). General instructions  Schedule regular health, dental, and eye exams.  Stay current with your vaccines.  Tell your health care provider if: ? You often feel depressed. ? You have ever been abused or do not feel safe at home. Summary  Adopting a healthy lifestyle and getting preventive care are important in promoting health and wellness.  Follow your health care provider's instructions about healthy diet, exercising, and getting tested or screened for diseases.  Follow your health care provider's instructions on monitoring your cholesterol and blood pressure. This information is not intended to replace advice given to you by your health care provider. Make sure you discuss any questions you have with your health care provider. Document Revised: 09/27/2018 Document Reviewed: 09/27/2018 Elsevier Patient Education  2020 Elsevier Inc.  

## 2020-06-03 NOTE — Chronic Care Management (AMB) (Signed)
Chronic Care Management Pharmacy  Name: David Baxter  MRN: [id number] DOB: 1941-07-24  Chief Complaint/ HPI  David Baxter,  79 y.o. , male presents for their Initial CCM visit with the clinical pharmacist In office.  PCP : Lillard Anes, MD  Their chronic conditions include: CAD, hypertension, hypothyroidism, diabetic glomerulopathy, CKD, BPH, hyperlipidemia, urinary retention, Gout.   Office Visits: 05/08/2020 - AWV with nursing.  04/24/2020 - Kayexelate x4d. Potassium high.  04/18/2020 - continue present diet and exercise.  12/06/2019 - No med changes noted Consult Visit: 04/15/2020 - Urology - continue medication for BPH (finasteride).  04/02/2020 - Nephrology - no med changes.   Medications: Outpatient Encounter Medications as of 06/11/2020  Medication Sig  . ACCU-CHEK AVIVA PLUS test strip 1 each by Other route 2 (two) times daily. Use as instructed  . Accu-Chek FastClix Lancets MISC by Does not apply route in the morning and at bedtime. Use to check blood sugar twice daily as directed.  Marland Kitchen allopurinol (ZYLOPRIM) 100 MG tablet TAKE 1 TABLET EVERY DAY  . aspirin EC 81 MG tablet Take 81 mg by mouth daily.  . bethanechol (URECHOLINE) 5 MG tablet Take 1 tablet (5 mg total) by mouth 2 (two) times daily.  . Cholecalciferol (VITAMIN D-1000 MAX ST) 25 MCG (1000 UT) tablet Take by mouth.  . COLCRYS 0.6 MG tablet Prn gout flares  . finasteride (PROSCAR) 5 MG tablet Take 5 mg by mouth every evening.  . Ginkgo Biloba (GINKOBA PO) Take by mouth.  . levothyroxine (SYNTHROID) 100 MCG tablet Take 100 mcg by mouth daily before breakfast.  . LOKELMA 10 g PACK packet Take 10 g by mouth daily.   . meclizine (ANTIVERT) 25 MG tablet Take by mouth. Takes OTC prn dizziness.  . metFORMIN (GLUCOPHAGE) 500 MG tablet TAKE 1 TABLET TWICE DAILY  . metoprolol tartrate (LOPRESSOR) 25 MG tablet Take 1 tablet (25 mg total) by mouth 2 (two) times daily.  . nitroGLYCERIN (NITROSTAT) 0.4 MG SL  tablet Comments:   Patient Notes: Place 1 tablet under tongue as directed USE EVERY 5 MINUTES UP TO THREE DOSES  Duration: 30  . simvastatin (ZOCOR) 40 MG tablet TAKE 1/2 TABLET EVERY DAY  . sodium bicarbonate 650 MG tablet Take by mouth 2 (two) times daily.  . vitamin B-12 (CYANOCOBALAMIN) 1000 MCG tablet Take 1,000 mcg by mouth daily.  . Accu-Chek Softclix Lancets lancets  (Patient not taking: Reported on 06/11/2020)  . prochlorperazine (COMPAZINE) 10 MG tablet Take by mouth. (Patient not taking: Reported on 06/11/2020)  . [DISCONTINUED] metformin (FORTAMET) 500 MG (OSM) 24 hr tablet Take 500 mg by mouth 2 (two) times daily with a meal.   No facility-administered encounter medications on file as of 06/11/2020.   Allergies  Allergen Reactions  . Tamsulosin Other (See Comments)  . Aspirin Other (See Comments)    Upset stomach   SDOH Screenings   Alcohol Screen:   . Last Alcohol Screening Score (AUDIT): Not on file  Depression (PHQ2-9): Low Risk   . PHQ-2 Score: 0  Financial Resource Strain:   . Difficulty of Paying Living Expenses: Not on file  Food Insecurity: No Food Insecurity  . Worried About Charity fundraiser in the Last Year: Never true  . Ran Out of Food in the Last Year: Never true  Housing: Low Risk   . Last Housing Risk Score: 0  Physical Activity:   . Days of Exercise per Week: Not on file  . Minutes  of Exercise per Session: Not on file  Social Connections:   . Frequency of Communication with Friends and Family: Not on file  . Frequency of Social Gatherings with Friends and Family: Not on file  . Attends Religious Services: Not on file  . Active Member of Clubs or Organizations: Not on file  . Attends Archivist Meetings: Not on file  . Marital Status: Not on file  Stress:   . Feeling of Stress : Not on file  Tobacco Use: Low Risk   . Smoking Tobacco Use: Never Smoker  . Smokeless Tobacco Use: Never Used  Transportation Needs:   . Lexicographer (Medical): Not on file  . Lack of Transportation (Non-Medical): Not on file     Current Diagnosis/Assessment:  Goals Addressed            This Visit's Progress   . Pharmacy Care Plan       CARE PLAN ENTRY (see longitudinal plan of care for additional care plan information)  Current Barriers:  . Chronic Disease Management support, education, and care coordination needs related to Hypertension, Hyperlipidemia, and Diabetes   Hypertension BP Readings from Last 3 Encounters:  05/08/20 120/78  04/18/20 130/62  12/06/19 (!) 145/82   . Pharmacist Clinical Goal(s): o Over the next 90 days, patient will work with PharmD and providers to maintain BP goal <130/80 . Current regimen:  o Metoprolol tartrate 25 mg bid . Interventions: o Reviewed medications and adherence.  . Patient self care activities - Over the next 90 days, patient will: o Check BP monthly, document, and provide at future appointments o Ensure daily salt intake < 2300 mg/day  Hyperlipidemia Lab Results  Component Value Date/Time   LDLCALC 71 04/18/2020 08:26 AM   . Pharmacist Clinical Goal(s): o Over the next 90 days, patient will work with PharmD and providers to achieve LDL goal < 70 . Current regimen:  o Simvastatin 20 mg daily  . Interventions: o Discussed medications and healthy diet.  o Pharmacist will review next lipid panel in November.  . Patient self care activities - Over the next 90 days, patient will: o Continue taking medication as prescribed.  o Contact pharmacist or provider with any questions or concerns.   Diabetes Lab Results  Component Value Date/Time   HGBA1C 5.7 (H) 04/18/2020 08:26 AM   . Pharmacist Clinical Goal(s): o Over the next 90 days, patient will work with PharmD and providers to maintain A1c goal <7% . Current regimen:  o Metformin 500 mg bid  . Interventions: o Reviewed patient's home blood sugar readings and medication adherence for diabetes  medications.  o Patient needs a prescription for the Accu-chek Fastclix lancests. The Softclix do not fit his lancing device.  . Patient self care activities - Over the next 90 days, patient will: o Check blood sugar once daily, document, and provide at future appointments o Contact provider with any episodes of hypoglycemia  Medication Assistance  . Pharmacist Clinical Goal(s) o Over the next 90 days, patient will work with PharmD and providers to improve access to [facility] . Current regimen:  o Lokelma 10 grams daily . Interventions: o Coordinating patient assistance application for [employer].  . Patient self care activities - Over the next 90 days, patient will: o Provide information needed to submit patient assistance application.   Medication management . Pharmacist Clinical Goal(s): o Over the next 90 days, patient will work with PharmD and providers to maintain optimal medication adherence .  Current pharmacy: [facility] . Interventions o Comprehensive medication review performed. o Continue current medication management strategy . Patient self care activities - Over the next 90 days, patient will: o Focus on medication adherence by using pill box.  o Take medications as prescribed o Report any questions or concerns to PharmD and/or provider(s)  Initial goal documentation        Diabetes   Recent Relevant Labs: Lab Results  Component Value Date/Time   HGBA1C 5.7 (H) 04/18/2020 08:26 AM    Kidney Function Lab Results  Component Value Date/Time   CREATININE 1.79 (H) 04/23/2020 09:00 AM   CREATININE 1.58 (H) 04/18/2020 08:26 AM   GFRNONAA 35 (L) 04/23/2020 09:00 AM   GFRAA 40 (L) 04/23/2020 09:00 AM   K 5.8 (H) 05/08/2020 09:39 AM   K 6.0 (H) 04/23/2020 09:00 AM    Checking BG: Daily  Recent FBG Readings: 80s-90s Patient has failed these meds in past: n/a Patient is currently controlled on the following medications:   Metformin 500 mg twice daily with  a meal  Regular  Accuchek Aviva Plus test strips and Accuchek Fastclix lancets to check blood sugar  Last diabetic Foot exam: 04/2020  Last diabetic Eye exam: 09/2019 - next visit scheduled 09/2020  We discussed: diet and exercise extensively. Patient has to eat a low potassium diet due to kidneys. He enjoys lots of fruit but has had to limit oranges and bananas. Patient does not currently have an active prescription for the correct lancets. He needs fastclix. Pharmacist requesting updated prescription from Dr. Blanch Media nurse sent to mail order. Continue to monitor kidney function with metformin dosing.   Plan  Continue current medications and  Hypertension   BP today is:  <130/80  Office blood pressures are  BP Readings from Last 3 Encounters:  05/08/20 120/78  04/18/20 130/62  12/06/19 (!) 145/82    Patient has failed these meds in the past: n/a Patient is currently controlled on the following medications:   Metoprolol tartrate 25 mg bid Patient checks BP at home infrequently  Patient home BP readings are ranging: 125-130/70s  We discussed diet and exercise extensively  Plan  Continue current medications   Hyperlipidemia   LDL goal < 70  Lipid Panel     Component Value Date/Time   CHOL 131 04/18/2020 0826   TRIG 89 04/18/2020 0826   HDL 43 04/18/2020 0826   LDLCALC 71 04/18/2020 0826    Hepatic Function Latest Ref Rng & Units 04/23/2020 04/18/2020  Total Protein 6.0 - 8.5 g/dL 6.4 6.8  Albumin 3.7 - 4.7 g/dL 4.2 4.4  AST 0 - 40 IU/L 20 17  ALT 0 - 44 IU/L 12 11  Alk Phosphatase 48 - 121 IU/L 84 86  Total Bilirubin 0.0 - 1.2 mg/dL 0.4 0.4     The ASCVD Risk score Mikey Bussing DC Jr., et al., 2013) failed to calculate for the following reasons:   The 2013 ASCVD risk score is only valid for ages 65 to 44   Patient has failed these meds in past: n/a Patient is currently uncontrolled on the following medications:  . Aspirin 81 mg daily . Simvastatin 20 mg daily  We  discussed:  diet and exercise extensively. Pharmacist will monitor repeat lipid panel.   Plan  Continue current medications  Hypothyroidism   Lab Results  Component Value Date/Time   TSH 2.160 04/18/2020 08:26 AM    Patient has failed these meds in past: none reported Patient is  currently controlled on the following medications:  . Levothyroxine 100 mcg daily  We discussed:  Patient's TSH at goal.   Plan  Continue current medications  Gout   Patient has failed these meds in past: n/a Patient is currently controlled on the following medications:  . Allopurinol 100 mg daily . Colcrys 0.6 mg prn gout flares  We discussed:  Patient hasn't had a gout flare in some time but uses Colcrys prn at that time. Monitor kidney function for potential dose adjustments to Colcrys for future flares. Patient indicates he watches his diet to minimize risk of flares.   Plan  Continue current medications  BPH/Urinary retention   Patient has failed these meds in past: Rapaflo, tamsulosin Patient is currently controlled on the following medications:  . Finasteride 5 mg every evening  . Bethanechol 5 mg bid   We discussed:  Patient sees urology for management. Indicates that he is still retaining a little urine at last visit but no changes at this time.   Plan  Continue current medications   Health Maintenance   Patient is currently controlled on the following medications:  . Sodium bicarbonate 650 mg bid - supplemental per nephrologist . Vitamin B-12 1000 mcg daily - supplemental . Gingko Biloba - supplemental . Lokelma 10 g bid - elevated potassium . Vitamin D 1000 units daily - supplemental  We discussed:  Patient followed by nephrology for high potassium. He tries to be mindful of dietary intake. He works to reduce potatoes, bananas and oranges. Lokelma is quite expensive due to coverage gap. Pharmacist working to obtain through patient assistance.   Plan  Continue current  medications  Vaccines   Reviewed and discussed patient's vaccination history. Patient has had both Pneumonia shots. Has not had shingles vaccine due to cost.   Immunization History  Administered Date(s) Administered  . Moderna SARS-COVID-2 Vaccination 11/22/2019, 12/18/2019    Plan  Recommended patient receive annual flu vaccine in office.   Medication Management   Pt uses [employer] for all medications Uses pill box? Yes Pt endorses excellent compliance  We discussed: Patient's Lokelma prior authorization form submitted to manufacturer. Patient's medications are free through mail order for generics.   Plan  Continue current medication management strategy    Follow up: 6 month phone visit

## 2020-06-05 ENCOUNTER — Encounter: Payer: Self-pay | Admitting: Legal Medicine

## 2020-06-05 ENCOUNTER — Telehealth (INDEPENDENT_AMBULATORY_CARE_PROVIDER_SITE_OTHER): Payer: Medicare PPO | Admitting: Legal Medicine

## 2020-06-05 DIAGNOSIS — J0100 Acute maxillary sinusitis, unspecified: Secondary | ICD-10-CM | POA: Diagnosis not present

## 2020-06-05 DIAGNOSIS — J019 Acute sinusitis, unspecified: Secondary | ICD-10-CM

## 2020-06-05 HISTORY — DX: Acute sinusitis, unspecified: J01.90

## 2020-06-05 MED ORDER — CEFTRIAXONE SODIUM 1 G IJ SOLR
1.0000 g | Freq: Once | INTRAMUSCULAR | Status: AC
  Administered 2020-06-05: 1 g via INTRAMUSCULAR

## 2020-06-05 NOTE — Progress Notes (Signed)
Virtual Visit via Telephone Note   This visit type was conducted due to national recommendations for restrictions regarding the COVID-19 Pandemic (e.g. social distancing) in an effort to limit this patient's exposure and mitigate transmission in our community.  Due to his co-morbid illnesses, this patient is at least at moderate risk for complications without adequate follow up.  This format is felt to be most appropriate for this patient at this time.  The patient did not have access to video technology/had technical difficulties with video requiring transitioning to audio format only (telephone).  All issues noted in this document were discussed and addressed.  No physical exam could be performed with this format.  Patient verbally consented to a telehealth visit.   Date:  06/05/2020   ID:  David Baxter, DOB 1941-05-29, MRN [id number]  Patient Location: Home Provider Location: Office/Clinic  PCP:  Abigail Miyamoto, MD   Evaluation Performed:  New Patient Evaluation  Chief Complaint:  Sick, when on trip with daughter  History of Present Illness:    PHI AVANS is a 79 y.o. male with sick, cough, low grade fever, no productive cough.  He wants injection not oral medicines  The patient does not have symptoms concerning for COVID-19 infection (fever, chills, cough, or new shortness of breath).    Past Medical History:  Diagnosis Date  . Arthralgia of both lower legs   . BPH (benign prostatic hyperplasia)   . CAD (coronary artery disease)    cardiologist--  dr Dulce Sellar w/ [facility] in [place]  . CKD (chronic kidney disease), stage III   . History of gout   . History of kidney stones   . History of stroke    per pt/ wife -- unknown when-- told this was on MRI in 2017  . HOH (hard of hearing)   . Hyperlipidemia   . Hypertension   . Hypothyroidism   . S/P drug eluting coronary stent placement    2012-- x1DES to OM:  05/ 2016  x2 to CFX    Past Surgical  History:  Procedure Laterality Date  . CORONARY ANGIOPLASTY WITH STENT PLACEMENT  03/04/2015   x2 DES to CFX  . CORONARY ANGIOPLASTY WITH STENT PLACEMENT  2012   x1 DES to OM  . CYSTOSCOPY WITH INSERTION OF UROLIFT N/A 02/14/2017   Procedure: CYSTOSCOPY WITH INSERTION OF UROLIFT;  Surgeon: Malen Gauze, MD;  Location: [facility];  Service: Urology;  Laterality: N/A;  . EXTRACORPOREAL SHOCK WAVE LITHOTRIPSY  1990's  . PERCUTANEOUS NEPHROSTOLITHOTOMY  1991   and abdominal hernia repair  . URETEROLITHOTOMY  1991 and 1994    Family History  Problem Relation Age of Onset  . Heart disease Mother 77       After age 18 - CHF  . Hypertension Father   . Heart disease Father 77       After age 37 - CHF    Social History   Socioeconomic History  . Marital status: Married    Spouse name: Not on file  . Number of children: Not on file  . Years of education: Not on file  . Highest education level: Not on file  Occupational History  . Occupation: truck Hospital doctor  Tobacco Use  . Smoking status: Never Smoker  . Smokeless tobacco: Never Used  Substance and Sexual Activity  . Alcohol use: No  . Drug use: No  . Sexual activity: Not on file  Other Topics Concern  .  Not on file  Social History Narrative  . Not on file   Social Determinants of Health   Financial Resource Strain:   . Difficulty of Paying Living Expenses: Not on file  Food Insecurity:   . Worried About Programme researcher, broadcasting/film/video in the Last Year: Not on file  . Ran Out of Food in the Last Year: Not on file  Transportation Needs:   . Lack of Transportation (Medical): Not on file  . Lack of Transportation (Non-Medical): Not on file  Physical Activity:   . Days of Exercise per Week: Not on file  . Minutes of Exercise per Session: Not on file  Stress:   . Feeling of Stress : Not on file  Social Connections:   . Frequency of Communication with Friends and Family: Not on file  . Frequency of Social Gatherings  with Friends and Family: Not on file  . Attends Religious Services: Not on file  . Active Member of Clubs or Organizations: Not on file  . Attends Banker Meetings: Not on file  . Marital Status: Not on file  Intimate Partner Violence:   . Fear of Current or Ex-Partner: Not on file  . Emotionally Abused: Not on file  . Physically Abused: Not on file  . Sexually Abused: Not on file    Outpatient Medications Prior to Visit  Medication Sig Dispense Refill  . ACCU-CHEK AVIVA PLUS test strip 1 each by Other route 2 (two) times daily. Use as instructed 100 each 2  . Accu-Chek Softclix Lancets lancets     . allopurinol (ZYLOPRIM) 100 MG tablet TAKE 1 TABLET EVERY DAY 90 tablet 2  . aspirin EC 81 MG tablet Take 81 mg by mouth daily.    . bethanechol (URECHOLINE) 5 MG tablet Take 1 tablet (5 mg total) by mouth 2 (two) times daily. 90 tablet 4  . Cholecalciferol (VITAMIN D-1000 MAX ST) 25 MCG (1000 UT) tablet Take by mouth.    . COLCRYS 0.6 MG tablet     . finasteride (PROSCAR) 5 MG tablet Take 5 mg by mouth every evening.    . Ginkgo Biloba (GINKOBA PO) Take by mouth.    . levothyroxine (SYNTHROID) 100 MCG tablet Take 100 mcg by mouth daily before breakfast.    . LOKELMA 10 g PACK packet 2 (two) times daily.    . meclizine (ANTIVERT) 25 MG tablet Take by mouth.    . metFORMIN (GLUCOPHAGE) 500 MG tablet TAKE 1 TABLET TWICE DAILY 180 tablet 2  . metoprolol tartrate (LOPRESSOR) 25 MG tablet Take 1 tablet (25 mg total) by mouth 2 (two) times daily. 180 tablet 2  . nitroGLYCERIN (NITROSTAT) 0.4 MG SL tablet Comments:   Patient Notes: Place 1 tablet under tongue as directed USE EVERY 5 MINUTES UP TO THREE DOSES  Duration: 30    . prochlorperazine (COMPAZINE) 10 MG tablet Take by mouth.    . simvastatin (ZOCOR) 40 MG tablet TAKE 1/2 TABLET EVERY DAY 45 tablet 2  . sodium bicarbonate 650 MG tablet Take by mouth 2 (two) times daily.    . vitamin B-12 (CYANOCOBALAMIN) 1000 MCG tablet Take  1,000 mcg by mouth daily.    . metformin (FORTAMET) 500 MG (OSM) 24 hr tablet Take 500 mg by mouth 2 (two) times daily with a meal.     No facility-administered medications prior to visit.   .med Allergies:   Tamsulosin and Aspirin   Social History   Tobacco Use  . Smoking  status: Never Smoker  . Smokeless tobacco: Never Used  Substance Use Topics  . Alcohol use: No  . Drug use: No     Review of Systems  Constitutional: Negative.   HENT: Positive for congestion and sinus pain.   Eyes: Negative.   Respiratory: Negative.   Cardiovascular: Negative.   Gastrointestinal: Negative.   Genitourinary: Negative.   Musculoskeletal: Negative.   Skin: Negative.   Neurological: Negative.   Psychiatric/Behavioral: Negative.      Labs/Other Tests and Data Reviewed:    Recent Labs: 04/18/2020: Hemoglobin 14.3; Platelets 341; TSH 2.160 04/23/2020: ALT 12; BUN 28; Creatinine, Ser 1.79; Sodium 143 05/08/2020: Potassium 5.8   Recent Lipid Panel Lab Results  Component Value Date/Time   CHOL 131 04/18/2020 08:26 AM   TRIG 89 04/18/2020 08:26 AM   HDL 43 04/18/2020 08:26 AM   CHOLHDL 3.0 04/18/2020 08:26 AM   LDLCALC 71 04/18/2020 08:26 AM    Wt Readings from Last 3 Encounters:  06/05/20 156 lb (70.8 kg)  05/08/20 158 lb (71.7 kg)  04/18/20 158 lb (71.7 kg)     Objective:    Vital Signs:  Temp 98.5 F (36.9 C)   Ht 6' (1.829 m)   Wt 156 lb (70.8 kg)   BMI 21.16 kg/m    Physical Exam VS reviewed  ASSESSMENT & PLAN:   Diagnoses and all orders for this visit: Acute non-recurrent maxillary sinusitis The patient has acute sinusitis and insists on injection.  He is to come in for Rocephin injection- no reaction  No orders of the defined types were placed in this encounter.    Meds ordered this encounter  Medications  . cefTRIAXone (ROCEPHIN) injection 1 g    COVID-19 Education: The signs and symptoms of COVID-19 were discussed with the patient and how to seek care for  testing (follow up with PCP or arrange E-visit). The importance of social distancing was discussed today.  Time:   Today, I have spent 20 minutes with the patient with telehealth technology discussing the above problems.    Follow Up:  In Person prn  Signed, Brent Bulla, MD  06/05/2020 6:28 PM    [facility] [place]

## 2020-06-11 ENCOUNTER — Other Ambulatory Visit: Payer: Self-pay

## 2020-06-11 ENCOUNTER — Ambulatory Visit: Payer: Medicare PPO

## 2020-06-11 DIAGNOSIS — E1121 Type 2 diabetes mellitus with diabetic nephropathy: Secondary | ICD-10-CM

## 2020-06-11 DIAGNOSIS — E782 Mixed hyperlipidemia: Secondary | ICD-10-CM

## 2020-06-11 DIAGNOSIS — I10 Essential (primary) hypertension: Secondary | ICD-10-CM

## 2020-06-11 MED ORDER — ACCU-CHEK FASTCLIX LANCETS MISC
1.0000 | Freq: Two times a day (BID) | 6 refills | Status: DC
Start: 2020-06-11 — End: 2021-07-01

## 2020-06-11 NOTE — Patient Instructions (Addendum)
Visit Information  Thank you for your time discussing your medications. I look forward to working with you to achieve your health care goals. Below is a summary of what we talked about during our visit.   Goals Addressed            This Visit's Progress   . Pharmacy Care Plan       CARE PLAN ENTRY (see longitudinal plan of care for additional care plan information)  Current Barriers:  . Chronic Disease Management support, education, and care coordination needs related to Hypertension, Hyperlipidemia, and Diabetes   Hypertension BP Readings from Last 3 Encounters:  05/08/20 120/78  04/18/20 130/62  12/06/19 (!) 145/82   . Pharmacist Clinical Goal(s): o Over the next 90 days, patient will work with PharmD and providers to maintain BP goal <130/80 . Current regimen:  o Metoprolol tartrate 25 mg bid . Interventions: o Reviewed medications and adherence.  . Patient self care activities - Over the next 90 days, patient will: o Check BP monthly, document, and provide at future appointments o Ensure daily salt intake < 2300 mg/day  Hyperlipidemia Lab Results  Component Value Date/Time   LDLCALC 71 04/18/2020 08:26 AM   . Pharmacist Clinical Goal(s): o Over the next 90 days, patient will work with PharmD and providers to achieve LDL goal < 70 . Current regimen:  o Simvastatin 20 mg daily  . Interventions: o Discussed medications and healthy diet.  o Pharmacist will review next lipid panel in November.  . Patient self care activities - Over the next 90 days, patient will: o Continue taking medication as prescribed.  o Contact pharmacist or provider with any questions or concerns.   Diabetes Lab Results  Component Value Date/Time   HGBA1C 5.7 (H) 04/18/2020 08:26 AM   . Pharmacist Clinical Goal(s): o Over the next 90 days, patient will work with PharmD and providers to maintain A1c goal <7% . Current regimen:  o Metformin 500 mg bid  . Interventions: o Reviewed  patient's home blood sugar readings and medication adherence for diabetes medications.  o Patient needs a prescription for the Accu-chek Fastclix lancests. The Softclix do not fit his lancing device.  . Patient self care activities - Over the next 90 days, patient will: o Check blood sugar once daily, document, and provide at future appointments o Contact provider with any episodes of hypoglycemia  Medication Assistance  . Pharmacist Clinical Goal(s) o Over the next 90 days, patient will work with PharmD and providers to improve access to [facility] . Current regimen:  o Lokelma 10 grams daily . Interventions: o Coordinating patient assistance application for [employer].  . Patient self care activities - Over the next 90 days, patient will: o Provide information needed to submit patient assistance application.   Medication management . Pharmacist Clinical Goal(s): o Over the next 90 days, patient will work with PharmD and providers to maintain optimal medication adherence . Current pharmacy: [facility] . Interventions o Comprehensive medication review performed. o Continue current medication management strategy . Patient self care activities - Over the next 90 days, patient will: o Focus on medication adherence by using pill box.  o Take medications as prescribed o Report any questions or concerns to PharmD and/or provider(s)  Initial goal documentation        Mr. Barich was given information about Chronic Care Management services today including:  1. CCM service includes personalized support from designated clinical staff supervised by his physician, including individualized  plan of care and coordination with other care providers 2. 24/7 contact phone numbers for assistance for urgent and routine care needs. 3. Standard insurance, coinsurance, copays and deductibles apply for chronic care management only during months in which we provide at least 20 minutes of these services.  Most insurances cover these services at 100%, however patients may be responsible for any copay, coinsurance and/or deductible if applicable. This service may help you avoid the need for more expensive face-to-face services. 4. Only one practitioner may furnish and bill the service in a calendar month. 5. The patient may stop CCM services at any time (effective at the end of the month) by phone call to the office staff.  Patient agreed to services and verbal consent obtained.   The patient verbalized understanding of instructions provided today and agreed to receive a mailed copy of patient instruction and/or educational materials. Telephone follow up appointment with pharmacy team member scheduled for: 11/2020  Juliane Lack, PharmD Clinical Pharmacist [facility] [phone] (office) [phone] (mobile)  Food Basics for Chronic Kidney Disease When your kidneys are not working well, they cannot remove waste and excess substances from your blood as effectively as they did before. This can lead to a buildup and imbalance of these substances, which can worsen kidney damage and affect how your body functions. Certain foods lead to a buildup of these substances in the body. By changing your diet as recommended by your diet and nutrition specialist (dietitian) or health care provider, you could help prevent further kidney damage and delay or prevent the need for dialysis. What are tips for following this plan? General instructions   Work with your health care provider and dietitian to develop a meal plan that is right for you. Foods you can eat, limit, or avoid will be different for each person depending on the stage of kidney disease and any other existing health conditions.  Talk with your health care provider about whether you should take a vitamin and mineral supplement.  Use standard measuring cups and spoons to measure servings of foods. Use a kitchen scale to measure portions  of protein foods.  If directed by your health care provider, avoid drinking too much fluid. Measure and count all liquids, including water, ice, soups, flavored gelatin, and frozen desserts such as popsicles or ice cream. Reading food labels  Check the amount of sodium in foods. Choose foods that have less than 300 milligrams (mg) per serving.  Check the ingredient list for phosphorus or potassium-based additives or preservatives.  Check the amount of saturated and trans fat. Limit or avoid these fats as told by your dietitian. Shopping  Avoid buying foods that are: ? Processed, frozen, or prepackaged. ? Calcium-enriched or fortified.  Do not buy foods that have salt or sodium listed among the first five ingredients.  Do not buy canned vegetables. Cooking  Replace animal proteins, such as meat, fish, eggs, or dairy, with plant proteins from beans, nuts, and soy. ? Use soy milk instead of cow's milk. ? Add beans or tofu to soups, casseroles, or pasta dishes instead of meat.  Soak vegetables, such as potatoes, before cooking to reduce potassium. To do this: ? Peel and cut into small pieces. ? Soak in warm water for at least 2 hours. For every 1 cup of vegetables, use 10 cups of water. ? Drain and rinse with warm water. ? Boil for at least 5 minutes. Meal planning  Limit the amount of protein from plant  and animal sources you eat each day.  Do not add salt to food when cooking or before eating.  Eat meals and snacks at around the same time each day. If you have diabetes:  If you have diabetes (diabetes mellitus) and chronic kidney disease, it is important to keep your blood glucose in the target range recommended by your health care provider. Follow your diabetes management plan. This may include: ? Checking your blood glucose regularly. ? Taking oral medicines, insulin, or both. ? Exercising for at least 30 minutes on 5 or more days each week, or as told by your health care  provider. ? Tracking how many servings of carbohydrates you eat at each meal.  You may be given specific guidelines on how much of certain foods and nutrients you may eat, depending on your stage of kidney disease and whether you have high blood pressure (hypertension). Follow your meal plan as told by your dietitian. What nutrients should be limited? The items listed are not a complete list. Talk with your dietitian about what dietary choices are best for you. Potassium Potassium affects how steadily your heart beats. If too much potassium builds up in your blood, it can cause an irregular heartbeat or even a heart attack. You may need to eat less potassium, depending on your blood potassium levels and the stage of kidney disease. Talk to your dietitian about how much potassium you may have each day. You may need to limit or avoid foods that are high in potassium, such as:  Milk and soy milk.  Fruits, such as bananas, papaya, apricots, nectarines, melon, prunes, raisins, kiwi, and oranges.  Vegetables, such as potatoes, sweet potatoes, yams, tomatoes, leafy greens, beets, okra, avocado, pumpkin, and winter squash.  White and lima beans. Phosphorus Phosphorus is a mineral found in your bones. A balance between calcium and phosphorous is needed to build and maintain healthy bones. Too much phosphorus pulls calcium from your bones. This can make your bones weak and more likely to break. Too much phosphorus can also make your skin itch. You may need to eat less phosphorus depending on your blood phosphorus levels and the stage of kidney disease. Talk to your dietitian about how much potassium you may have each day. You may need to take medicine to lower your blood phosphorus levels if diet changes do not help. You may need to limit or avoid foods that are high in phosphorus, such as:  Milk and dairy products.  Dried beans and peas.  Tofu, soy milk, and other soy-based meat  replacements.  Colas.  Nuts and peanut butter.  Meat, poultry, and fish.  Bran cereals and oatmeals. Protein Protein helps you to make and keep muscle. It also helps in the repair of your body's cells and tissues. One of the natural breakdown products of protein is a waste product called urea. When your kidneys are not working properly, they cannot remove wastes, such as urea, like they did before you developed chronic kidney disease. Reducing how much protein you eat can help prevent a buildup of urea in your blood. Depending on your stage of kidney disease, you may need to limit foods that are high in protein. Sources of animal protein include:  Meat (all types).  Fish and seafood.  Poultry.  Eggs.  Dairy. Other protein foods include:  Beans and legumes.  Nuts and nut butter.  Soy and tofu. Sodium Sodium, which is found in salt, helps maintain a healthy balance of fluids in  your body. Too much sodium can increase your blood pressure and have a negative effect on the function of your heart and lungs. Too much sodium can also cause your body to retain too much fluid, making your kidneys work harder. Most people should have less than 2,300 milligrams (mg) of sodium each day. If you have hypertension, you may need to limit your sodium to 1,500 mg each day. Talk to your dietitian about how much sodium you may have each day. You may need to limit or avoid foods that are high in sodium, such as:  Salt seasonings.  Soy sauce.  Cured and processed meats.  Salted crackers and snack foods.  Fast food.  Canned soups and most canned foods.  Pickled foods.  Vegetable juice.  Boxed mixes or ready-to-eat boxed meals and side dishes.  Bottled dressings, sauces, and marinades. Summary  Chronic kidney disease can lead to a buildup and imbalance of waste and excess substances in the body. Certain foods lead to a buildup of these substances. By adjusting your intake of these  foods, you could help prevent more kidney damage and delay or prevent the need for dialysis.  Food adjustments are different for each person with chronic kidney disease. Work with a dietitian to set up nutrient goals and a meal plan that is right for you.  If you have diabetes and chronic kidney disease, it is important to keep your blood glucose in the target range recommended by your health care provider. This information is not intended to replace advice given to you by your health care provider. Make sure you discuss any questions you have with your health care provider. Document Revised: 01/25/2019 Document Reviewed: 09/29/2016 Elsevier Patient Education  2020 [employer].

## 2020-06-16 ENCOUNTER — Telehealth: Payer: Self-pay

## 2020-06-16 NOTE — Progress Notes (Signed)
Patient's Lokelma was [place] by [place] and [place] patient assistance as of 06/16/2020-10/17/2020.   Patient's application will need to be renewed over the pone in December for 2022. Patient's medication shipped 06/16/2020 and should arrive in 3-5 business days.   Spoke with Harriett Sine to let her know this approval.   Juliane Lack, PharmD, [facility] Clinical Pharmacist [facility] [phone] (office) [phone] (mobile)

## 2020-06-18 DIAGNOSIS — M25562 Pain in left knee: Secondary | ICD-10-CM | POA: Insufficient documentation

## 2020-06-18 DIAGNOSIS — E1169 Type 2 diabetes mellitus with other specified complication: Secondary | ICD-10-CM | POA: Insufficient documentation

## 2020-06-18 DIAGNOSIS — N1832 Chronic kidney disease, stage 3b: Secondary | ICD-10-CM | POA: Insufficient documentation

## 2020-06-18 DIAGNOSIS — E785 Hyperlipidemia, unspecified: Secondary | ICD-10-CM | POA: Insufficient documentation

## 2020-06-18 DIAGNOSIS — Z955 Presence of coronary angioplasty implant and graft: Secondary | ICD-10-CM | POA: Insufficient documentation

## 2020-06-18 NOTE — Progress Notes (Signed)
Cardiology Office Note:    Date:  06/19/2020   ID:  David Baxter, DOB [birth year], MRN [id number]  PCP:  Abigail Miyamoto, MD  Cardiologist:  Norman Herrlich, MD    Referring MD: Abigail Miyamoto,*    ASSESSMENT:    1. CAD in native artery   2. 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 Mixed hyperlipidemia    PLAN:    In order of problems listed above:  1. Stable CAD having no angina continue medical treatment including aspirin beta-blocker and his intermediate intensity statin. 2. Stable at target continue treatment beta-blocker followed by nephrology for his CKD complicated with hyperkalemia he is pending evaluation by urology for what sounds like urinary retention 3. Stable lipids are ideal LDL at target continue his intermediate intensity statin   Next appointment: 1 year   Medication Adjustments/Labs and Tests Ordered: Current medicines are reviewed at length with the patient today.  Concerns regarding medicines are outlined above.  Orders Placed This Encounter  Procedures  . EKG 12-Lead   No orders of the defined types were placed in this encounter.   Chief Complaint  Patient presents with  . Follow-up  . Coronary Artery Disease    History of Present Illness:    David Baxter is a 79 y.o. male with a hx of CAD with PCI and 2 DES to [facility] 2016 , T2 DM, hypertension, hyperlipidemia, CKD stage 3  and orthostatic hypotension last seen 05/22/2019.  He has had hyperkalemia and has needed to be treated with present binder Kayexalate.  Previous serum potassium 6.40-month ago last recording 5.8 taking Lokelma along with sodium bicarb-. Compliance with diet, lifestyle and medications: Yes  He is recovered from his admission to the hospital with tells me he has urinary retention on ultrasound he says 1 L is scheduled to see his urologist.  Overall he has had no angina dyspnea palpitation or syncope compliant with his medications  including statin without muscle pain or weakness.   1 mo ago  (05/08/20) 1 mo ago  (04/23/20) 2 mo ago  (04/18/20)   Potassium 3.5 - 5.2 mmol/L 5.8High  6.0High  5.9High   He had hospital admission 04/07/2018 in [place] for urinary tract infection with systemic inflammatory response syndrome.  In hospital his sodium was normal 138 potassium normal 3.8 creatinine was 1.65 and improved at discharge creatinine 1.44 GFR 58 cc potassium 5.2.  His UA was turbid large amount of blood white cells and bacteria with E. coli. on urine culture. Past Medical History:  Diagnosis Date  . Acquired hypothyroidism 04/08/2018  . Acute retention of urine 04/09/2018  . Acute sinusitis 06/05/2020  . AKI (acute kidney injury) (HCC) 04/08/2018  . Arthralgia of both lower legs   . BMI 23.0-23.9, adult 04/21/2020  . BPH (benign prostatic hyperplasia)   . 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 CAD (coronary artery disease)    cardiologist--  dr Dulce Sellar w/ [facility] in [place]  . CKD (chronic kidney disease) stage 3, GFR 30-59 ml/min 07/06/2015  . CKD (chronic kidney disease), stage III   . 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 DES OM 2012    2. two DES to circumflex coronary artery 03/03/15  . 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 Essential hypertension 04/09/2015  . Gout 04/08/2018  . History of gout   . History of kidney stones   . History of  stroke    per pt/ wife -- unknown when-- told this was on MRI in 2017  . HOH (hard of hearing)   . Hyperkalemia 05/09/2020  . Hyperlipemia, retention 04/09/2015  . Hyperlipidemia   . Hypertension   . Hypothyroidism   . S/P drug eluting coronary stent placement    2012-- x1DES to OM:  05/ 2016  x2 to CFX  . 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 SIRS (systemic inflammatory response syndrome) (HCC) 04/08/2018  . Type 2 diabetes mellitus (HCC) 04/08/2018  . UTI (urinary tract  infection) 04/08/2018    Past Surgical History:  Procedure Laterality Date  . CORONARY ANGIOPLASTY WITH STENT PLACEMENT  03/04/2015   x2 DES to CFX  . CORONARY ANGIOPLASTY WITH STENT PLACEMENT  2012   x1 DES to OM  . CYSTOSCOPY WITH INSERTION OF UROLIFT N/A 02/14/2017   Procedure: CYSTOSCOPY WITH INSERTION OF UROLIFT;  Surgeon: Malen Gauze, MD;  Location: [facility];  Service: Urology;  Laterality: N/A;  . EXTRACORPOREAL SHOCK WAVE LITHOTRIPSY  1990's  . PERCUTANEOUS NEPHROSTOLITHOTOMY  1991   and abdominal hernia repair  . URETEROLITHOTOMY  1991 and 1994    Current Medications: Current Meds  Medication Sig  . ACCU-CHEK AVIVA PLUS test strip 1 each by Other route 2 (two) times daily. Use as instructed  . Accu-Chek FastClix Lancets MISC 1 each by Does not apply route in the morning and at bedtime. Use to check blood sugar twice daily as directed.  Marland Kitchen allopurinol (ZYLOPRIM) 100 MG tablet TAKE 1 TABLET EVERY DAY  . aspirin EC 81 MG tablet Take 81 mg by mouth daily.  . bethanechol (URECHOLINE) 5 MG tablet Take 1 tablet (5 mg total) by mouth 2 (two) times daily.  . Cholecalciferol (VITAMIN D-1000 MAX ST) 25 MCG (1000 UT) tablet Take 2,000 Units by mouth.   . COLCRYS 0.6 MG tablet Prn gout flares  . finasteride (PROSCAR) 5 MG tablet Take 5 mg by mouth every evening.  . Ginkgo Biloba (GINKOBA PO) Take by mouth.  . levothyroxine (SYNTHROID) 100 MCG tablet Take 1 tablet by mouth every morning.  Marland Kitchen LOKELMA 10 g PACK packet Take 10 g by mouth daily.   . meclizine (ANTIVERT) 25 MG tablet Take by mouth. Takes OTC prn dizziness.  . metFORMIN (GLUCOPHAGE) 500 MG tablet TAKE 1 TABLET TWICE DAILY  . metoprolol tartrate (LOPRESSOR) 25 MG tablet Take 1 tablet (25 mg total) by mouth 2 (two) times daily.  . nitroGLYCERIN (NITROSTAT) 0.4 MG SL tablet Comments:   Patient Notes: Place 1 tablet under tongue as directed USE EVERY 5 MINUTES UP TO THREE DOSES  Duration: 30  . simvastatin  (ZOCOR) 40 MG tablet TAKE 1/2 TABLET EVERY DAY  . sodium bicarbonate 650 MG tablet Take by mouth 2 (two) times daily.  . vitamin B-12 (CYANOCOBALAMIN) 1000 MCG tablet Take 1,000 mcg by mouth daily.     Allergies:   Tamsulosin and Aspirin   Social History   Socioeconomic History  . Marital status: Married    Spouse name: Not on file  . Number of children: Not on file  . Years of education: Not on file  . Highest education level: Not on file  Occupational History  . Occupation: truck Hospital doctor  Tobacco Use  . Smoking status: Never Smoker  . Smokeless tobacco: Never Used  Substance and Sexual Activity  . Alcohol use: No  . Drug use: No  . Sexual activity: Not on file  Other  Topics Concern  . Not on file  Social History Narrative  . Not on file   Social Determinants of Health   Financial Resource Strain:   . Difficulty of Paying Living Expenses: Not on file  Food Insecurity: No Food Insecurity  . Worried About Programme researcher, broadcasting/film/video in the Last Year: Never true  . Ran Out of Food in the Last Year: Never true  Transportation Needs:   . Lack of Transportation (Medical): Not on file  . Lack of Transportation (Non-Medical): Not on file  Physical Activity:   . Days of Exercise per Week: Not on file  . Minutes of Exercise per Session: Not on file  Stress:   . Feeling of Stress : Not on file  Social Connections:   . Frequency of Communication with Friends and Family: Not on file  . Frequency of Social Gatherings with Friends and Family: Not on file  . Attends Religious Services: Not on file  . Active Member of Clubs or Organizations: Not on file  . Attends Banker Meetings: Not on file  . Marital Status: Not on file     Family History: The patient's family history includes Heart disease (age of onset: 68) in his mother; Heart disease (age of onset: 28) in his father; Hypertension in his father. ROS:   Please see the history of present illness.    All other systems  reviewed and are negative.  EKGs/Labs/Other Studies Reviewed:    The following studies were reviewed today:  EKG:  EKG ordered today and personally reviewed.  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 unchanged from his last visit 1 year ago  Recent Labs: 04/18/2020: Hemoglobin 14.3; Platelets 341; TSH 2.160 04/23/2020: ALT 12; BUN 28; Creatinine, Ser 1.79; Sodium 143 05/08/2020: Potassium 5.8  Recent Lipid Panel    Component Value Date/Time   CHOL 131 04/18/2020 0826   TRIG 89 04/18/2020 0826   HDL 43 04/18/2020 0826   CHOLHDL 3.0 04/18/2020 0826   LDLCALC 71 04/18/2020 0826    Physical Exam:    VS:  BP 126/74   Pulse 69   Ht 6' (1.829 m)   Wt 160 lb (72.6 kg)   SpO2 97%   BMI 21.70 kg/m     Wt Readings from Last 3 Encounters:  06/19/20 160 lb (72.6 kg)  06/05/20 156 lb (70.8 kg)  05/08/20 158 lb (71.7 kg)     GEN:  Well nourished, well developed in no acute distress HEENT: Normal NECK: No JVD; No carotid bruits LYMPHATICS: No lymphadenopathy CARDIAC: RRR, no murmurs, rubs, gallops RESPIRATORY:  Clear to auscultation without rales, wheezing or rhonchi  ABDOMEN: Soft, non-tender, non-distended MUSCULOSKELETAL:  No edema; No deformity  SKIN: Warm and dry NEUROLOGIC:  Alert and oriented x 3 PSYCHIATRIC:  Normal affect    Signed, Norman Herrlich, MD  06/19/2020 3:41 PM    [facility]

## 2020-06-19 ENCOUNTER — Ambulatory Visit: Payer: Medicare PPO | Admitting: Cardiology

## 2020-06-19 ENCOUNTER — Other Ambulatory Visit: Payer: Self-pay

## 2020-06-19 ENCOUNTER — Encounter: Payer: Self-pay | Admitting: Cardiology

## 2020-06-19 VITALS — BP 126/74 | HR 69 | Ht 72.0 in | Wt 160.0 lb

## 2020-06-19 DIAGNOSIS — N1832 Chronic kidney disease, stage 3b: Secondary | ICD-10-CM | POA: Diagnosis not present

## 2020-06-19 DIAGNOSIS — I2510 Atherosclerotic heart disease of native coronary artery without angina pectoris: Secondary | ICD-10-CM

## 2020-06-19 DIAGNOSIS — E782 Mixed hyperlipidemia: Secondary | ICD-10-CM

## 2020-06-19 DIAGNOSIS — I129 Hypertensive chronic kidney disease with stage 1 through stage 4 chronic kidney disease, or unspecified chronic kidney disease: Secondary | ICD-10-CM

## 2020-06-19 NOTE — Patient Instructions (Signed)

## 2020-07-17 ENCOUNTER — Other Ambulatory Visit: Payer: Self-pay | Admitting: Legal Medicine

## 2020-08-19 ENCOUNTER — Ambulatory Visit: Payer: Medicare PPO | Admitting: Legal Medicine

## 2020-08-19 ENCOUNTER — Encounter: Payer: Self-pay | Admitting: Legal Medicine

## 2020-08-19 ENCOUNTER — Other Ambulatory Visit: Payer: Self-pay

## 2020-08-19 VITALS — BP 120/62 | HR 64 | Temp 97.6°F | Resp 16 | Ht 72.0 in | Wt 164.5 lb

## 2020-08-19 DIAGNOSIS — E1121 Type 2 diabetes mellitus with diabetic nephropathy: Secondary | ICD-10-CM

## 2020-08-19 DIAGNOSIS — Z23 Encounter for immunization: Secondary | ICD-10-CM | POA: Diagnosis not present

## 2020-08-19 DIAGNOSIS — E782 Mixed hyperlipidemia: Secondary | ICD-10-CM | POA: Diagnosis not present

## 2020-08-19 DIAGNOSIS — E039 Hypothyroidism, unspecified: Secondary | ICD-10-CM | POA: Diagnosis not present

## 2020-08-19 DIAGNOSIS — I10 Essential (primary) hypertension: Secondary | ICD-10-CM

## 2020-08-19 NOTE — Progress Notes (Signed)
Subjective:  Patient ID: David Baxter, male    DOB: 10/29/1940  Age: 79 y.o. MRN: [id number]  Chief Complaint  Patient presents with  . Hyperlipidemia    4 month follow, pt is fasting    HPI: Chronic visit  Patient presents with hyperlipidemia.  Compliance with treatment has been good; patient takes medicines as directed, maintains low cholesterol diet, follows up as directed, and maintains exercise regimen.  Patient is using simvastatin without problems.  Patient presents for follow up of hypertension.  Patient tolerating metoprolol well with side effects.  Patient was diagnosed with hypertension 2010 so has been treated for hypertension for 10 years.Patient is working on maintaining diet and exercise regimen and follows up as directed. Complication include none  Patient present with type 2 diabetes.  Specifically, this is type 2, noninsulin requiring diabetes, complicated by nephropathy.  Compliance with treatment has been good; patient take medicines as directed, maintains diet and exercise regimen, follows up as directed, and is keeping glucose diary.  Date of  diagnosis 2010.  Depression screen has been performed.Tobacco screen nonsmoker. Current medicines for diabetes metformin.  Patient is on none for renal protection and simvasttin for cholesterol control.  Patient performs foot exams daily and last ophthalmologic exam was not yet..   Current Outpatient Medications on File Prior to Visit  Medication Sig Dispense Refill  . ACCU-CHEK AVIVA PLUS test strip 1 each by Other route 2 (two) times daily. Use as instructed 100 each 2  . Accu-Chek FastClix Lancets MISC 1 each by Does not apply route in the morning and at bedtime. Use to check blood sugar twice daily as directed. 100 each 6  . allopurinol (ZYLOPRIM) 100 MG tablet TAKE 1 TABLET EVERY DAY 90 tablet 2  . aspirin EC 81 MG tablet Take 81 mg by mouth daily.    . bethanechol (URECHOLINE) 5 MG tablet Take 1 tablet (5 mg total) by  mouth 2 (two) times daily. 90 tablet 4  . Cholecalciferol (VITAMIN D-1000 MAX ST) 25 MCG (1000 UT) tablet Take 2,000 Units by mouth.     . COLCRYS 0.6 MG tablet Prn gout flares    . finasteride (PROSCAR) 5 MG tablet Take 5 mg by mouth every evening.    . Ginkgo Biloba (GINKOBA PO) Take by mouth.    . levothyroxine (SYNTHROID) 100 MCG tablet TAKE 1 TABLET BY MOUTH ONCE DAILY 90 tablet 2  . LOKELMA 10 g PACK packet Take 10 g by mouth daily.     . meclizine (ANTIVERT) 25 MG tablet Take by mouth. Takes OTC prn dizziness.    . metFORMIN (GLUCOPHAGE) 500 MG tablet TAKE 1 TABLET TWICE DAILY 180 tablet 2  . metoprolol tartrate (LOPRESSOR) 25 MG tablet Take 1 tablet (25 mg total) by mouth 2 (two) times daily. 180 tablet 2  . nitroGLYCERIN (NITROSTAT) 0.4 MG SL tablet Comments:   Patient Notes: Place 1 tablet under tongue as directed USE EVERY 5 MINUTES UP TO THREE DOSES  Duration: 30    . simvastatin (ZOCOR) 40 MG tablet TAKE 1/2 TABLET EVERY DAY 45 tablet 2  . sodium bicarbonate 650 MG tablet Take by mouth 2 (two) times daily.    . vitamin B-12 (CYANOCOBALAMIN) 1000 MCG tablet Take 1,000 mcg by mouth daily.     No current facility-administered medications on file prior to visit.   Past Medical History:  Diagnosis Date  . Acquired hypothyroidism 04/08/2018  . Acute retention of urine 04/09/2018  . Acute sinusitis  06/05/2020  . AKI (acute kidney injury) (HCC) 04/08/2018  . Arthralgia of both lower legs   . BMI 23.0-23.9, adult 04/21/2020  . BPH (benign prostatic hyperplasia)   . 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 CAD (coronary artery disease)    cardiologist--  dr Dulce Sellar w/ [facility] in [place]  . CKD (chronic kidney disease) stage 3, GFR 30-59 ml/min (HCC) 07/06/2015  . CKD (chronic kidney disease), stage III (HCC)   . 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 DES OM 2012    2. two DES to circumflex coronary artery 03/03/15    . 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 Essential hypertension 04/09/2015  . Gout 04/08/2018  . History of gout   . History of kidney stones   . History of stroke    per pt/ wife -- unknown when-- told this was on MRI in 2017  . HOH (hard of hearing)   . Hyperkalemia 05/09/2020  . Hyperlipemia, retention 04/09/2015  . Hyperlipidemia   . Hypertension   . Hypothyroidism   . S/P drug eluting coronary stent placement    2012-- x1DES to OM:  05/ 2016  x2 to CFX  . 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 SIRS (systemic inflammatory response syndrome) (HCC) 04/08/2018  . Type 2 diabetes mellitus (HCC) 04/08/2018  . UTI (urinary tract infection) 04/08/2018   Past Surgical History:  Procedure Laterality Date  . CORONARY ANGIOPLASTY WITH STENT PLACEMENT  03/04/2015   x2 DES to CFX  . CORONARY ANGIOPLASTY WITH STENT PLACEMENT  2012   x1 DES to OM  . CYSTOSCOPY WITH INSERTION OF UROLIFT N/A 02/14/2017   Procedure: CYSTOSCOPY WITH INSERTION OF UROLIFT;  Surgeon: Malen Gauze, MD;  Location: [facility];  Service: Urology;  Laterality: N/A;  . EXTRACORPOREAL SHOCK WAVE LITHOTRIPSY  1990's  . PERCUTANEOUS NEPHROSTOLITHOTOMY  1991   and abdominal hernia repair  . URETEROLITHOTOMY  1991 and 1994    Family History  Problem Relation Age of Onset  . Heart disease Mother 40       After age 64 - CHF  . Hypertension Father   . Heart disease Father 56       After age 57 - CHF   Social History   Socioeconomic History  . Marital status: Married    Spouse name: Not on file  . Number of children: Not on file  . Years of education: Not on file  . Highest education level: Not on file  Occupational History  . Occupation: truck Hospital doctor  Tobacco Use  . Smoking status: Never Smoker  . Smokeless tobacco: Never Used  Substance and Sexual Activity  . Alcohol use: No  . Drug use: No  . Sexual activity: Yes    Partners: Female  Other Topics Concern  . Not  on file  Social History Narrative  . Not on file   Social Determinants of Health   Financial Resource Strain:   . Difficulty of Paying Living Expenses: Not on file  Food Insecurity: No Food Insecurity  . Worried About Programme researcher, broadcasting/film/video in the Last Year: Never true  . Ran Out of Food in the Last Year: Never true  Transportation Needs:   . Lack of Transportation (Medical): Not on file  . Lack of Transportation (Non-Medical): Not on file  Physical Activity:   . Days of Exercise per Week: Not on file  . Minutes of Exercise  per Session: Not on file  Stress:   . Feeling of Stress : Not on file  Social Connections:   . Frequency of Communication with Friends and Family: Not on file  . Frequency of Social Gatherings with Friends and Family: Not on file  . Attends Religious Services: Not on file  . Active Member of Clubs or Organizations: Not on file  . Attends Banker Meetings: Not on file  . Marital Status: Not on file    Review of Systems  Constitutional: Negative.  Negative for activity change and appetite change.  HENT: Negative.   Eyes: Negative.   Respiratory: Negative for cough, shortness of breath and stridor.   Cardiovascular: Negative for chest pain, palpitations and leg swelling.  Gastrointestinal: Negative.   Genitourinary: Negative.   Musculoskeletal: Negative.   Skin: Negative.   Neurological: Negative.   Psychiatric/Behavioral: Positive for confusion.     Objective:  BP 120/62   Pulse 64   Temp 97.6 F (36.4 C)   Resp 16   Ht 6' (1.829 m)   Wt 164 lb 8 oz (74.6 kg)   SpO2 98%   BMI 22.31 kg/m   BP/Weight 08/19/2020 06/19/2020 06/05/2020  Systolic BP 120 126 -  Diastolic BP 62 74 -  Wt. (Lbs) 164.5 160 156  BMI 22.31 21.7 21.16    Physical Exam Vitals reviewed.  Constitutional:      Appearance: Normal appearance.  HENT:     Right Ear: Tympanic membrane, ear canal and external ear normal.     Left Ear: Tympanic membrane, ear canal  and external ear normal.     Mouth/Throat:     Mouth: Mucous membranes are moist.     Pharynx: Oropharynx is clear.  Eyes:     Extraocular Movements: Extraocular movements intact.     Conjunctiva/sclera: Conjunctivae normal.     Pupils: Pupils are equal, round, and reactive to light.  Cardiovascular:     Rate and Rhythm: Normal rate and regular rhythm.     Pulses: Normal pulses.     Heart sounds: Normal heart sounds.  Pulmonary:     Effort: Pulmonary effort is normal.     Breath sounds: Normal breath sounds.  Abdominal:     General: Abdomen is flat.     Palpations: Abdomen is soft.  Musculoskeletal:        General: Normal range of motion.     Cervical back: Neck supple.  Skin:    General: Skin is warm and dry.     Capillary Refill: Capillary refill takes less than 2 seconds.  Neurological:     Mental Status: He is alert and oriented to person, place, and time.  Psychiatric:        Mood and Affect: Mood normal.     Diabetic Foot Exam - Simple   Simple Foot Form Diabetic Foot exam was performed with the following findings: Yes 08/19/2020  9:04 AM  Visual Inspection No deformities, no ulcerations, no other skin breakdown bilaterally: Yes Sensation Testing Intact to touch and monofilament testing bilaterally: Yes Pulse Check See comments: Yes Comments Poor capillary filing      Lab Results  Component Value Date   WBC 8.7 04/18/2020   HGB 14.3 04/18/2020   HCT 44.3 04/18/2020   PLT 341 04/18/2020   GLUCOSE 97 04/23/2020   CHOL 131 04/18/2020   TRIG 89 04/18/2020   HDL 43 04/18/2020   LDLCALC 71 04/18/2020   ALT 12 04/23/2020   AST  20 04/23/2020   NA 143 04/23/2020   K 5.8 (H) 05/08/2020   CL 104 04/23/2020   CREATININE 1.79 (H) 04/23/2020   BUN 28 (H) 04/23/2020   CO2 28 04/23/2020   TSH 2.160 04/18/2020   HGBA1C 5.7 (H) 04/18/2020      Assessment & Plan:   1. Mixed hyperlipidemia - CBC with Differential/Platelet - Lipid panel AN INDIVIDUAL CARE  PLAN for hyperlipidemia/ cholesterol was established and reinforced today.  The patient's status was assessed using clinical findings on exam, lab and other diagnostic tests. The patient's disease status was assessed based on evidence-based guidelines and found to be well controlled. MEDICATIONS were reviewed. SELF MANAGEMENT GOALS have been discussed and patient's success at attaining the goal of low cholesterol was assessed. RECOMMENDATION given include regular exercise 3 days a week and low cholesterol/low fat diet. CLINICAL SUMMARY including written plan to identify barriers unique to the patient due to social or economic  reasons was discussed.  2. Diabetic glomerulopathy (HCC) - Hemoglobin A1c An individual care plan for diabetes was established and reinforced today.  The patient's status was assessed using clinical findings on exam, labs and diagnostic testing. Patient success at meeting goals based on disease specific evidence-based guidelines and found to be good controlled. Medications were assessed and patient's understanding of the medical issues , including barriers were assessed. Recommend adherence to a diabetic diet, a graduated exercise program, HgbA1c level is checked quarterly, and urine microalbumin performed yearly .  Annual mono-filament sensation testing performed. Lower blood pressure and control hyperlipidemia is important. Get annual eye exams and annual flu shots and smoking cessation discussed.  Self management goals were discussed.  3. Essential hypertension - Comprehensive metabolic panel An individual hypertension care plan was established and reinforced today.  The patient's status was assessed using clinical findings on exam and labs or diagnostic tests. The patient's success at meeting treatment goals on disease specific evidence-based guidelines and found to be well controlled. SELF MANAGEMENT: The patient and I together assessed ways to personally work towards  obtaining the recommended goals. RECOMMENDATIONS: avoid decongestants found in common cold remedies, decrease consumption of alcohol, perform routine monitoring of BP with home BP cuff, exercise, reduction of dietary salt, take medicines as prescribed, try not to miss doses and quit smoking.  Regular exercise and maintaining a healthy weight is needed.  Stress reduction may help. A CLINICAL SUMMARY including written plan identify barriers to care unique to individual due to social or financial issues.  We attempt to mutually creat solutions for individual and family understanding.  4. Acquired hypothyroidism - TSH Patient is known to have hypothyroid and is on treatment with no thyroid.  Patient was diagnosed 10 years ago.  Other treatment includes none.  Patient is compliant with medicines and last TSH 6 months ago.  Last TSH was normal.      Orders Placed This Encounter  Procedures  . Flu Vaccine QUAD High Dose(Fluad)  . CBC with Differential/Platelet  . Comprehensive metabolic panel  . Hemoglobin A1c  . Lipid panel  . TSH     Follow-up: Return in about 4 months (around 12/17/2020) for fasting.  An After Visit Summary was printed and given to the patient.  [facility] [phone]

## 2020-08-19 NOTE — Progress Notes (Signed)
   Covid-19 Vaccination Clinic  Name:  AMARIUS TOTO    MRN: [id number] DOB: 06-May-1941  08/19/2020  Mr. Carpenter was observed post Covid-19 immunization for 15 minutes without incident. He was provided with Vaccine Information Sheet and instruction to access the V-Safe system.   Mr. Nawrot was instructed to call 911 with any severe reactions post vaccine: Marland Kitchen Difficulty breathing  . Swelling of face and throat  . A fast heartbeat  . A bad rash all over body  . Dizziness and weakness

## 2020-08-20 LAB — CBC WITH DIFFERENTIAL/PLATELET
Basophils Absolute: 0.1 10*3/uL (ref 0.0–0.2)
Basos: 1 %
EOS (ABSOLUTE): 0.5 10*3/uL — ABNORMAL HIGH (ref 0.0–0.4)
Eos: 6 %
Hematocrit: 42.5 % (ref 37.5–51.0)
Hemoglobin: 14.1 g/dL (ref 13.0–17.7)
Immature Grans (Abs): 0 10*3/uL (ref 0.0–0.1)
Immature Granulocytes: 0 %
Lymphocytes Absolute: 2.9 10*3/uL (ref 0.7–3.1)
Lymphs: 32 %
MCH: 29.4 pg (ref 26.6–33.0)
MCHC: 33.2 g/dL (ref 31.5–35.7)
MCV: 89 fL (ref 79–97)
Monocytes Absolute: 0.9 10*3/uL (ref 0.1–0.9)
Monocytes: 10 %
Neutrophils Absolute: 4.7 10*3/uL (ref 1.4–7.0)
Neutrophils: 51 %
Platelets: 331 10*3/uL (ref 150–450)
RBC: 4.79 x10E6/uL (ref 4.14–5.80)
RDW: 14 % (ref 11.6–15.4)
WBC: 9.2 10*3/uL (ref 3.4–10.8)

## 2020-08-20 LAB — COMPREHENSIVE METABOLIC PANEL
ALT: 9 IU/L (ref 0–44)
AST: 13 IU/L (ref 0–40)
Albumin/Globulin Ratio: 1.8 (ref 1.2–2.2)
Albumin: 4.1 g/dL (ref 3.6–4.6)
Alkaline Phosphatase: 74 IU/L (ref 44–121)
BUN/Creatinine Ratio: 15 (ref 10–24)
BUN: 22 mg/dL (ref 8–27)
Bilirubin Total: 0.4 mg/dL (ref 0.0–1.2)
CO2: 28 mmol/L (ref 20–29)
Calcium: 9.1 mg/dL (ref 8.6–10.2)
Chloride: 104 mmol/L (ref 96–106)
Creatinine, Ser: 1.49 mg/dL — ABNORMAL HIGH (ref 0.76–1.27)
GFR calc Af Amer: 50 mL/min/{1.73_m2} — ABNORMAL LOW (ref >59)
GFR calc non Af Amer: 43 mL/min/{1.73_m2} — ABNORMAL LOW (ref >59)
Globulin, Total: 2.3 g/dL (ref 1.5–4.5)
Glucose: 99 mg/dL (ref 65–99)
Potassium: 5.2 mmol/L (ref 3.5–5.2)
Sodium: 146 mmol/L — ABNORMAL HIGH (ref 134–144)
Total Protein: 6.4 g/dL (ref 6.0–8.5)

## 2020-08-20 LAB — LIPID PANEL
Chol/HDL Ratio: 3.2 ratio (ref 0.0–5.0)
Cholesterol, Total: 140 mg/dL (ref 100–199)
HDL: 44 mg/dL (ref >39)
LDL Chol Calc (NIH): 79 mg/dL (ref 0–99)
Triglycerides: 87 mg/dL (ref 0–149)
VLDL Cholesterol Cal: 17 mg/dL (ref 5–40)

## 2020-08-20 LAB — TSH: TSH: 2.69 u[IU]/mL (ref 0.450–4.500)

## 2020-08-20 LAB — HEMOGLOBIN A1C
Est. average glucose Bld gHb Est-mCnc: 123 mg/dL
Hgb A1c MFr Bld: 5.9 % — ABNORMAL HIGH (ref 4.8–5.6)

## 2020-08-20 LAB — CARDIOVASCULAR RISK ASSESSMENT

## 2020-08-20 NOTE — Progress Notes (Signed)
CBC normal, kidney tests stable, liver tests normal, A1c 5.9, Cholesterol normal, TSH 2.69 normal,  lp

## 2020-09-23 DIAGNOSIS — E1169 Type 2 diabetes mellitus with other specified complication: Secondary | ICD-10-CM | POA: Insufficient documentation

## 2020-09-23 DIAGNOSIS — Z87442 Personal history of urinary calculi: Secondary | ICD-10-CM

## 2020-09-23 DIAGNOSIS — E039 Hypothyroidism, unspecified: Secondary | ICD-10-CM | POA: Diagnosis not present

## 2020-09-23 DIAGNOSIS — Z955 Presence of coronary angioplasty implant and graft: Secondary | ICD-10-CM | POA: Insufficient documentation

## 2020-09-23 DIAGNOSIS — H9190 Unspecified hearing loss, unspecified ear: Secondary | ICD-10-CM

## 2020-09-23 DIAGNOSIS — E875 Hyperkalemia: Secondary | ICD-10-CM | POA: Diagnosis not present

## 2020-09-23 DIAGNOSIS — I10 Essential (primary) hypertension: Secondary | ICD-10-CM | POA: Diagnosis not present

## 2020-09-23 DIAGNOSIS — Z8673 Personal history of transient ischemic attack (TIA), and cerebral infarction without residual deficits: Secondary | ICD-10-CM

## 2020-09-23 DIAGNOSIS — M2550 Pain in unspecified joint: Secondary | ICD-10-CM | POA: Insufficient documentation

## 2020-09-23 DIAGNOSIS — Z8739 Personal history of other diseases of the musculoskeletal system and connective tissue: Secondary | ICD-10-CM | POA: Insufficient documentation

## 2020-09-23 DIAGNOSIS — R809 Proteinuria, unspecified: Secondary | ICD-10-CM | POA: Diagnosis not present

## 2020-09-23 DIAGNOSIS — R309 Painful micturition, unspecified: Secondary | ICD-10-CM | POA: Diagnosis not present

## 2020-09-23 DIAGNOSIS — N183 Chronic kidney disease, stage 3 unspecified: Secondary | ICD-10-CM | POA: Diagnosis not present

## 2020-09-23 HISTORY — DX: Personal history of other diseases of the musculoskeletal system and connective tissue: Z87.39

## 2020-09-23 HISTORY — DX: Type 2 diabetes mellitus with other specified complication: E11.69

## 2020-09-23 HISTORY — DX: Personal history of urinary calculi: Z87.442

## 2020-09-23 HISTORY — DX: Personal history of transient ischemic attack (TIA), and cerebral infarction without residual deficits: Z86.73

## 2020-09-23 HISTORY — DX: Unspecified hearing loss, unspecified ear: H91.90

## 2020-09-23 HISTORY — DX: Presence of coronary angioplasty implant and graft: Z95.5

## 2020-09-24 ENCOUNTER — Ambulatory Visit (INDEPENDENT_AMBULATORY_CARE_PROVIDER_SITE_OTHER): Payer: Medicare PPO | Admitting: Urology

## 2020-09-24 ENCOUNTER — Encounter: Payer: Self-pay | Admitting: Urology

## 2020-09-24 ENCOUNTER — Other Ambulatory Visit: Payer: Self-pay

## 2020-09-24 VITALS — BP 153/75 | HR 63 | Temp 97.9°F | Ht 72.0 in | Wt 164.5 lb

## 2020-09-24 DIAGNOSIS — N401 Enlarged prostate with lower urinary tract symptoms: Secondary | ICD-10-CM | POA: Diagnosis not present

## 2020-09-24 DIAGNOSIS — R339 Retention of urine, unspecified: Secondary | ICD-10-CM

## 2020-09-24 DIAGNOSIS — N1330 Unspecified hydronephrosis: Secondary | ICD-10-CM

## 2020-09-24 LAB — URINALYSIS, ROUTINE W REFLEX MICROSCOPIC
Bilirubin, UA: NEGATIVE
Glucose, UA: NEGATIVE
Ketones, UA: NEGATIVE
Leukocytes,UA: NEGATIVE
Nitrite, UA: NEGATIVE
Protein,UA: NEGATIVE
RBC, UA: NEGATIVE
Specific Gravity, UA: 1.02 (ref 1.005–1.030)
Urobilinogen, Ur: 0.2 mg/dL (ref 0.2–1.0)
pH, UA: 7 (ref 5.0–7.5)

## 2020-09-24 LAB — BLADDER SCAN AMB NON-IMAGING

## 2020-09-24 MED ORDER — FINASTERIDE 5 MG PO TABS
5.0000 mg | ORAL_TABLET | Freq: Every evening | ORAL | 3 refills | Status: DC
Start: 2020-09-24 — End: 2020-10-28

## 2020-09-24 MED ORDER — BETHANECHOL CHLORIDE 5 MG PO TABS: 5.0000 mg | ORAL_TABLET | Freq: Two times a day (BID) | ORAL | 3 refills | Status: DC

## 2020-09-24 NOTE — Progress Notes (Signed)
Bladder Scan Patient can void: >641ml ml Performed By: Bridgette Habermann, lpn   Urological Symptom Review  Patient is experiencing the following symptoms: Get up at night to urinate   Review of Systems  Gastrointestinal (upper)  : Negative for upper GI symptoms  Gastrointestinal (lower) : Negative for lower GI symptoms  Constitutional : Negative for symptoms  Skin: Itching  Eyes: Negative for eye symptoms  Ear/Nose/Throat : Negative for Ear/Nose/Throat symptoms  Hematologic/Lymphatic: Easy bruising  Cardiovascular : Negative for cardiovascular symptoms  Respiratory : Negative for respiratory symptoms  Endocrine: Negative for endocrine symptoms  Musculoskeletal: Back pain  Neurological: Dizziness  Psychologic: Negative for psychiatric symptoms

## 2020-09-24 NOTE — Patient Instructions (Signed)

## 2020-09-24 NOTE — Progress Notes (Signed)
09/24/2020 2:43 PM   David Baxter 08-May-1941 [id number]  Referring provider: Abigail Miyamoto, MD [address] [place],  [place] [address]  followup BPH  HPI: [name] is a 79yo here for followup for BPH and nocturia. PVR 637cc. He has a good stream. Nocturia 1x. No incontinence. No pelvic pain. He is on finasteride 5mg  daily and bethenacol 5mg  BID. He is happy with his urination. No UTI   PMH: Past Medical History:  Diagnosis Date  . Acquired hypothyroidism 04/08/2018  . Acute retention of urine 04/09/2018  . Acute sinusitis 06/05/2020  . AKI (acute kidney injury) (HCC) 04/08/2018  . Arthralgia of both lower legs   . BMI 23.0-23.9, adult 04/21/2020  . BPH (benign prostatic hyperplasia)   . 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 CAD (coronary artery disease)    cardiologist--  dr 06/22/2020 w/ 12/07/2019 cardiology in [place]  . CKD (chronic kidney disease) stage 3, GFR 30-59 ml/min (HCC) 07/06/2015  . CKD (chronic kidney disease), stage III (HCC)   . 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 DES OM 2012    2. two DES to circumflex coronary artery 03/03/15  . 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 Essential hypertension 04/09/2015  . Gout 04/08/2018  . History of gout   . History of kidney stones   . History of stroke    per pt/ wife -- unknown when-- told this was on MRI in 2017  . HOH (hard of hearing)   . Hyperkalemia 05/09/2020  . Hyperlipemia, retention 04/09/2015  . Hyperlipidemia   . Hypertension   . Hypothyroidism   . S/P drug eluting coronary stent placement    2012-- x1DES to OM:  05/ 2016  x2 to CFX  . 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 SIRS (systemic inflammatory response syndrome) (HCC) 04/08/2018  . Type 2 diabetes mellitus (HCC) 04/08/2018  . UTI (urinary tract infection) 04/08/2018    Surgical History: Past Surgical History:  Procedure Laterality Date  .  CORONARY ANGIOPLASTY WITH STENT PLACEMENT  03/04/2015   x2 DES to CFX  . CORONARY ANGIOPLASTY WITH STENT PLACEMENT  2012   x1 DES to OM  . CYSTOSCOPY WITH INSERTION OF UROLIFT N/A 02/14/2017   Procedure: CYSTOSCOPY WITH INSERTION OF UROLIFT;  Surgeon: 2013, MD;  Location: [facility];  Service: Urology;  Laterality: N/A;  . EXTRACORPOREAL SHOCK WAVE LITHOTRIPSY  1990's  . PERCUTANEOUS NEPHROSTOLITHOTOMY  1991   and abdominal hernia repair  . URETEROLITHOTOMY  1991 and 1994    Home Medications:  Allergies as of 09/24/2020      Reactions   Tamsulosin Other (See Comments)   Aspirin Other (See Comments)   Upset stomach      Medication List       Accurate as of September 24, 2020  2:43 PM. If you have any questions, ask your nurse or doctor.        Accu-Chek Aviva Plus test strip Generic drug: glucose blood 1 each by Other route 2 (two) times daily. Use as instructed   Accu-Chek FastClix Lancets Misc 1 each by Does not apply route in the morning and at bedtime. Use to check blood sugar twice daily as directed.   allopurinol 100 MG tablet Commonly known as: ZYLOPRIM TAKE 1 TABLET EVERY DAY   aspirin EC 81 MG tablet Take 81 mg by mouth daily.   bethanechol 5 MG  tablet Commonly known as: URECHOLINE Take 1 tablet (5 mg total) by mouth 2 (two) times daily.   Colcrys 0.6 MG tablet Generic drug: colchicine Prn gout flares   finasteride 5 MG tablet Commonly known as: PROSCAR Take 5 mg by mouth every evening.   GINKOBA PO Take by mouth.   levothyroxine 100 MCG tablet Commonly known as: SYNTHROID TAKE 1 TABLET BY MOUTH ONCE DAILY   Lokelma 10 g Pack packet Generic drug: sodium zirconium cyclosilicate Take 10 g by mouth daily.   meclizine 25 MG tablet Commonly known as: ANTIVERT Take by mouth. Takes OTC prn dizziness.   metFORMIN 500 MG tablet Commonly known as: GLUCOPHAGE TAKE 1 TABLET TWICE DAILY   metoprolol tartrate 25 MG  tablet Commonly known as: LOPRESSOR Take 1 tablet (25 mg total) by mouth 2 (two) times daily.   nitroGLYCERIN 0.4 MG SL tablet Commonly known as: NITROSTAT Comments:   Patient Notes: Place 1 tablet under tongue as directed USE EVERY 5 MINUTES UP TO THREE DOSES  Duration: 30   simvastatin 40 MG tablet Commonly known as: ZOCOR TAKE 1/2 TABLET EVERY DAY   sodium bicarbonate 650 MG tablet Take by mouth 2 (two) times daily.   vitamin B-12 1000 MCG tablet Commonly known as: CYANOCOBALAMIN Take 1,000 mcg by mouth daily.   Vitamin D-1000 Max St 25 MCG (1000 UT) tablet Generic drug: Cholecalciferol Take 2,000 Units by mouth.       Allergies:  Allergies  Allergen Reactions  . Tamsulosin Other (See Comments)  . Aspirin Other (See Comments)    Upset stomach    Family History: Family History  Problem Relation Age of Onset  . Heart disease Mother 68       After age 54 - CHF  . Hypertension Father   . Heart disease Father 14       After age 49 - CHF    Social History:  reports that he has never smoked. He has never used smokeless tobacco. He reports that he does not drink alcohol and does not use drugs.  ROS: All other review of systems were reviewed and are negative except what is noted above in HPI  Physical Exam: BP (!) 153/75   Pulse 63   Temp 97.9 F (36.6 C)   Ht 6' (1.829 m)   Wt 164 lb 8 oz (74.6 kg)   BMI 22.31 kg/m   Constitutional:  Alert and oriented, No acute distress. HEENT: [place] AT, moist mucus membranes.  Trachea midline, no masses. Cardiovascular: No clubbing, cyanosis, or edema. Respiratory: Normal respiratory effort, no increased work of breathing. GI: Abdomen is soft, nontender, nondistended, no abdominal masses GU: No CVA tenderness.  Lymph: No cervical or inguinal lymphadenopathy. Skin: No rashes, bruises or suspicious lesions. Neurologic: Grossly intact, no focal deficits, moving all 4 extremities. Psychiatric: Normal mood and  affect.  Laboratory Data: Lab Results  Component Value Date   WBC 9.2 08/19/2020   HGB 14.1 08/19/2020   HCT 42.5 08/19/2020   MCV 89 08/19/2020   PLT 331 08/19/2020    Lab Results  Component Value Date   CREATININE 1.49 (H) 08/19/2020    No results found for: PSA  No results found for: TESTOSTERONE  Lab Results  Component Value Date   HGBA1C 5.9 (H) 08/19/2020    Urinalysis No results found for: COLORURINE, APPEARANCEUR, LABSPEC, PHURINE, GLUCOSEU, HGBUR, BILIRUBINUR, KETONESUR, PROTEINUR, UROBILINOGEN, NITRITE, LEUKOCYTESUR  No results found for: LABMICR, WBCUA, RBCUA, LABEPIT, MUCUS, BACTERIA  Pertinent Imaging:  No results found for this or any previous visit.  No results found for this or any previous visit.  No results found for this or any previous visit.  No results found for this or any previous visit.  No results found for this or any previous visit.  No results found for this or any previous visit.  No results found for this or any previous visit.  No results found for this or any previous visit.   Assessment & Plan:    1. Benign localized prostatic hyperplasia with lower urinary tract symptoms (LUTS) -continue finasteride and bethenacol 5mg  BID - Urinalysis, Routine w reflex microscopic - BLADDER SCAN AMB NON-IMAGING  2. Retention, urine -continue bethenacol 5mg  BID -RTC 6 months with renal   Return in about 6 months (around 03/25/2021) for PVR.  [place], MD  [facility] [place]

## 2020-09-29 DIAGNOSIS — H40013 Open angle with borderline findings, low risk, bilateral: Secondary | ICD-10-CM | POA: Diagnosis not present

## 2020-09-29 DIAGNOSIS — H02839 Dermatochalasis of unspecified eye, unspecified eyelid: Secondary | ICD-10-CM | POA: Diagnosis not present

## 2020-09-29 DIAGNOSIS — H524 Presbyopia: Secondary | ICD-10-CM | POA: Diagnosis not present

## 2020-09-29 DIAGNOSIS — H43393 Other vitreous opacities, bilateral: Secondary | ICD-10-CM | POA: Diagnosis not present

## 2020-09-29 DIAGNOSIS — Z7984 Long term (current) use of oral hypoglycemic drugs: Secondary | ICD-10-CM | POA: Diagnosis not present

## 2020-09-29 DIAGNOSIS — Z961 Presence of intraocular lens: Secondary | ICD-10-CM | POA: Diagnosis not present

## 2020-09-29 DIAGNOSIS — E119 Type 2 diabetes mellitus without complications: Secondary | ICD-10-CM | POA: Diagnosis not present

## 2020-09-29 LAB — HM DIABETES EYE EXAM

## 2020-10-02 ENCOUNTER — Encounter: Payer: Self-pay | Admitting: Legal Medicine

## 2020-10-03 ENCOUNTER — Telehealth: Payer: Self-pay

## 2020-10-03 NOTE — Chronic Care Management (AMB) (Signed)
Chronic Care Management Pharmacy Assistant   Name: David Baxter  MRN: [id number] DOB: May 22, 1941  Reason for Encounter: Medication Review    PCP : Abigail Miyamoto, MD  Allergies:   Allergies  Allergen Reactions  . Tamsulosin Other (See Comments)  . Aspirin Other (See Comments)    Upset stomach    Medications: Outpatient Encounter Medications as of 10/03/2020  Medication Sig  . ACCU-CHEK AVIVA PLUS test strip 1 each by Other route 2 (two) times daily. Use as instructed  . Accu-Chek FastClix Lancets MISC 1 each by Does not apply route in the morning and at bedtime. Use to check blood sugar twice daily as directed.  Marland Kitchen allopurinol (ZYLOPRIM) 100 MG tablet TAKE 1 TABLET EVERY DAY  . aspirin EC 81 MG tablet Take 81 mg by mouth daily.  . bethanechol (URECHOLINE) 5 MG tablet Take 1 tablet (5 mg total) by mouth 2 (two) times daily.  . Cholecalciferol (VITAMIN D-1000 MAX ST) 25 MCG (1000 UT) tablet Take 2,000 Units by mouth.   . COLCRYS 0.6 MG tablet Prn gout flares  . finasteride (PROSCAR) 5 MG tablet Take 1 tablet (5 mg total) by mouth every evening.  . Ginkgo Biloba (GINKOBA PO) Take by mouth.  . levothyroxine (SYNTHROID) 100 MCG tablet TAKE 1 TABLET BY MOUTH ONCE DAILY  . LOKELMA 10 g PACK packet Take 10 g by mouth daily.   . meclizine (ANTIVERT) 25 MG tablet Take by mouth. Takes OTC prn dizziness.  . metFORMIN (GLUCOPHAGE) 500 MG tablet TAKE 1 TABLET TWICE DAILY  . metoprolol tartrate (LOPRESSOR) 25 MG tablet Take 1 tablet (25 mg total) by mouth 2 (two) times daily.  . nitroGLYCERIN (NITROSTAT) 0.4 MG SL tablet Comments:   Patient Notes: Place 1 tablet under tongue as directed USE EVERY 5 MINUTES UP TO THREE DOSES  Duration: 30  . simvastatin (ZOCOR) 40 MG tablet TAKE 1/2 TABLET EVERY DAY  . sodium bicarbonate 650 MG tablet Take by mouth 2 (two) times daily.  . vitamin B-12 (CYANOCOBALAMIN) 1000 MCG tablet Take 1,000 mcg by mouth daily.   No facility-administered  encounter medications on file as of 10/03/2020.    Current Diagnosis: Patient Active Problem List   Diagnosis Date Noted  . S/P drug eluting coronary stent placement   . Hypothyroidism   . Hypertension   . Hyperlipidemia   . HOH (hard of hearing)   . History of stroke   . History of kidney stones   . History of gout   . CKD (chronic kidney disease), stage III (HCC)   . CAD (coronary artery disease)   . BPH (benign prostatic hyperplasia)   . Arthralgia of both lower legs   . Acute sinusitis 06/05/2020  . Hyperkalemia 05/09/2020  . BMI 23.0-23.9, adult 04/21/2020  . 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 Benign localized prostatic hyperplasia with lower urinary tract symptoms (LUTS) 12/05/2019  . Retention, urine 04/09/2018  . Acquired hypothyroidism 04/08/2018  . Gout 04/08/2018  . AKI (acute kidney injury) (HCC) 04/08/2018  . SIRS (systemic inflammatory response syndrome) (HCC) 04/08/2018  . UTI (urinary tract infection) 04/08/2018  . Acute kidney failure, unspecified (HCC) 04/08/2018  . Type 2 diabetes mellitus (HCC) 04/08/2018  . CKD (chronic kidney disease) stage 3, GFR 30-59 ml/min 07/06/2015  . 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 Hyperlipemia, retention 04/09/2015  . Essential hypertension 04/09/2015  Follow-Up:  Patient Assistance Coordination New patient assistance application form filled out to [employer] for [facility] . Waiting for patient and provider's signature. Called patient to have him come in to PCP office to sign application and to bring in proof of income. Called patient left message for him to call me back.  Levada Dy Manson Passey, CPP notified  Jon Gills, [facility] Clinical Pharmacist Assistant [phone]

## 2020-10-15 ENCOUNTER — Ambulatory Visit: Payer: Medicare PPO | Admitting: Urology

## 2020-10-25 ENCOUNTER — Other Ambulatory Visit: Payer: Self-pay | Admitting: Urology

## 2020-10-25 ENCOUNTER — Other Ambulatory Visit: Payer: Self-pay | Admitting: Legal Medicine

## 2020-10-25 DIAGNOSIS — R339 Retention of urine, unspecified: Secondary | ICD-10-CM

## 2020-11-03 ENCOUNTER — Telehealth: Payer: Self-pay

## 2020-11-03 NOTE — Progress Notes (Signed)
Chronic Care Management Pharmacy Assistant   Name: David Baxter  MRN: [id number] DOB: 1941/06/30  Reason for Encounter: General adherence call  Patient Questions:  1.  Have you seen any other providers since your last visit? Yes, 06/19/20 saw Cardiologist - no medication changes 08/19/20 saw PCP- no medication changes 09/23/20 saw Nephrology- no medication changes 09/24/20 saw Urology - no medication changes  2.  Any changes in your medicines or health? No    PCP : Abigail Miyamoto, MD  Allergies:   Allergies  Allergen Reactions  . Tamsulosin Other (See Comments)  . Aspirin Other (See Comments)    Upset stomach    Medications: Outpatient Encounter Medications as of 11/03/2020  Medication Sig  . ACCU-CHEK AVIVA PLUS test strip 1 each by Other route 2 (two) times daily. Use as instructed  . Accu-Chek FastClix Lancets MISC 1 each by Does not apply route in the morning and at bedtime. Use to check blood sugar twice daily as directed.  Marland Kitchen allopurinol (ZYLOPRIM) 100 MG tablet TAKE 1 TABLET EVERY DAY  . aspirin EC 81 MG tablet Take 81 mg by mouth daily.  . bethanechol (URECHOLINE) 5 MG tablet TAKE 1 TABLET TWICE DAILY  . Cholecalciferol (VITAMIN D-1000 MAX ST) 25 MCG (1000 UT) tablet Take 2,000 Units by mouth.   . COLCRYS 0.6 MG tablet Prn gout flares  . finasteride (PROSCAR) 5 MG tablet TAKE 1 TABLET EVERY DAY  . Ginkgo Biloba (GINKOBA PO) Take by mouth.  . levothyroxine (SYNTHROID) 100 MCG tablet TAKE 1 TABLET BY MOUTH ONCE DAILY  . LOKELMA 10 g PACK packet Take 10 g by mouth daily.   . meclizine (ANTIVERT) 25 MG tablet Take by mouth. Takes OTC prn dizziness.  . metFORMIN (GLUCOPHAGE) 500 MG tablet TAKE 1 TABLET TWICE DAILY  . metoprolol tartrate (LOPRESSOR) 25 MG tablet Take 1 tablet (25 mg total) by mouth 2 (two) times daily.  . nitroGLYCERIN (NITROSTAT) 0.4 MG SL tablet Comments:   Patient Notes: Place 1 tablet under tongue as directed USE EVERY 5 MINUTES UP TO THREE  DOSES  Duration: 30  . simvastatin (ZOCOR) 40 MG tablet TAKE 1/2 TABLET EVERY DAY  . sodium bicarbonate 650 MG tablet Take by mouth 2 (two) times daily.  . vitamin B-12 (CYANOCOBALAMIN) 1000 MCG tablet Take 1,000 mcg by mouth daily.   No facility-administered encounter medications on file as of 11/03/2020.    Current Diagnosis: Patient Active Problem List   Diagnosis Date Noted  . S/P drug eluting coronary stent placement   . Hypothyroidism   . Hypertension   . Hyperlipidemia   . HOH (hard of hearing)   . History of stroke   . History of kidney stones   . History of gout   . CKD (chronic kidney disease), stage III (HCC)   . CAD (coronary artery disease)   . BPH (benign prostatic hyperplasia)   . Arthralgia of both lower legs   . Acute sinusitis 06/05/2020  . Hyperkalemia 05/09/2020  . BMI 23.0-23.9, adult 04/21/2020  . 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 Benign localized prostatic hyperplasia with lower urinary tract symptoms (LUTS) 12/05/2019  . Retention, urine 04/09/2018  . Acquired hypothyroidism 04/08/2018  . Gout 04/08/2018  . AKI (acute kidney injury) (HCC) 04/08/2018  . SIRS (systemic inflammatory response syndrome) (HCC) 04/08/2018  . UTI (urinary tract infection) 04/08/2018  . Acute kidney failure, unspecified (HCC) 04/08/2018  . Type 2 diabetes  mellitus (HCC) 04/08/2018  . CKD (chronic kidney disease) stage 3, GFR 30-59 ml/min 07/06/2015  . 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 Hyperlipemia, retention 04/09/2015  . Essential hypertension 04/09/2015   Spoke to patient and he stated he is doing well, he has no issues to report.  The patient then gave the phone to his wife for me to speak with her.  Patient wife stated he is doing well, he is taking medication as directed and has no issues getting his medication, she stated she had just gotten some  from mail order.    The patients wife stated they are to come into the office and sign some PAP forms.  The patients blood sugars are running between 80-100 unless he gets upset about something, patients wife stated they don't go below 70.    Patient or his wife will notify us if there are any changes.   Follow-Up:  Pharmacist Review  [name], CPP notified  Leilani Able, [facility] Clinical Pharmacist Assistant [phone]

## 2020-11-07 ENCOUNTER — Telehealth: Payer: Self-pay

## 2020-11-10 NOTE — Progress Notes (Signed)
Chronic Care Management Pharmacy Assistant   Name: David Baxter  MRN: [id number] DOB: 25-Nov-1940  Reason for Encounter: Medication Review for delivery status of Lokelma   PCP : Abigail Miyamoto, MD  Allergies:   Allergies  Allergen Reactions  . Tamsulosin Other (See Comments)  . Aspirin Other (See Comments)    Upset stomach    Medications: Outpatient Encounter Medications as of 11/07/2020  Medication Sig  . ACCU-CHEK AVIVA PLUS test strip 1 each by Other route 2 (two) times daily. Use as instructed  . Accu-Chek FastClix Lancets MISC 1 each by Does not apply route in the morning and at bedtime. Use to check blood sugar twice daily as directed.  Marland Kitchen allopurinol (ZYLOPRIM) 100 MG tablet TAKE 1 TABLET EVERY DAY  . aspirin EC 81 MG tablet Take 81 mg by mouth daily.  . bethanechol (URECHOLINE) 5 MG tablet TAKE 1 TABLET TWICE DAILY  . Cholecalciferol (VITAMIN D-1000 MAX ST) 25 MCG (1000 UT) tablet Take 2,000 Units by mouth.   . COLCRYS 0.6 MG tablet Prn gout flares  . finasteride (PROSCAR) 5 MG tablet TAKE 1 TABLET EVERY DAY  . Ginkgo Biloba (GINKOBA PO) Take by mouth.  . levothyroxine (SYNTHROID) 100 MCG tablet TAKE 1 TABLET BY MOUTH ONCE DAILY  . LOKELMA 10 g PACK packet Take 10 g by mouth daily.   . meclizine (ANTIVERT) 25 MG tablet Take by mouth. Takes OTC prn dizziness.  . metFORMIN (GLUCOPHAGE) 500 MG tablet TAKE 1 TABLET TWICE DAILY  . metoprolol tartrate (LOPRESSOR) 25 MG tablet Take 1 tablet (25 mg total) by mouth 2 (two) times daily.  . nitroGLYCERIN (NITROSTAT) 0.4 MG SL tablet Comments:   Patient Notes: Place 1 tablet under tongue as directed USE EVERY 5 MINUTES UP TO THREE DOSES  Duration: 30  . simvastatin (ZOCOR) 40 MG tablet TAKE 1/2 TABLET EVERY DAY  . sodium bicarbonate 650 MG tablet Take by mouth 2 (two) times daily.  . vitamin B-12 (CYANOCOBALAMIN) 1000 MCG tablet Take 1,000 mcg by mouth daily.   No facility-administered encounter medications on file as  of 11/07/2020.    Current Diagnosis: Patient Active Problem List   Diagnosis Date Noted  . S/P drug eluting coronary stent placement   . Hypothyroidism   . Hypertension   . Hyperlipidemia   . HOH (hard of hearing)   . History of stroke   . History of kidney stones   . History of gout   . CKD (chronic kidney disease), stage III (HCC)   . CAD (coronary artery disease)   . BPH (benign prostatic hyperplasia)   . Arthralgia of both lower legs   . Acute sinusitis 06/05/2020  . Hyperkalemia 05/09/2020  . BMI 23.0-23.9, adult 04/21/2020  . 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 Benign localized prostatic hyperplasia with lower urinary tract symptoms (LUTS) 12/05/2019  . Retention, urine 04/09/2018  . Acquired hypothyroidism 04/08/2018  . Gout 04/08/2018  . AKI (acute kidney injury) (HCC) 04/08/2018  . SIRS (systemic inflammatory response syndrome) (HCC) 04/08/2018  . UTI (urinary tract infection) 04/08/2018  . Acute kidney failure, unspecified (HCC) 04/08/2018  . Type 2 diabetes mellitus (HCC) 04/08/2018  . CKD (chronic kidney disease) stage 3, GFR 30-59 ml/min 07/06/2015  . 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 Hyperlipemia, retention 04/09/2015  . Essential hypertension 04/09/2015    After being on hold and speaking with [employer] the [facility]  medication was delivered to the patient on 11/07/20.  Patient confirmed delivery.  Follow-Up:  Pharmacist Review  [name], CPP notified  Leilani Able, [facility] Clinical Pharmacist Assistant [phone]

## 2020-11-17 ENCOUNTER — Telehealth (INDEPENDENT_AMBULATORY_CARE_PROVIDER_SITE_OTHER): Payer: Medicare PPO | Admitting: Legal Medicine

## 2020-11-17 ENCOUNTER — Encounter: Payer: Self-pay | Admitting: Legal Medicine

## 2020-11-17 DIAGNOSIS — R519 Headache, unspecified: Secondary | ICD-10-CM

## 2020-11-17 DIAGNOSIS — R059 Cough, unspecified: Secondary | ICD-10-CM

## 2020-11-17 DIAGNOSIS — M791 Myalgia, unspecified site: Secondary | ICD-10-CM | POA: Diagnosis not present

## 2020-11-17 DIAGNOSIS — Z9189 Other specified personal risk factors, not elsewhere classified: Secondary | ICD-10-CM | POA: Diagnosis not present

## 2020-11-17 LAB — POC COVID19 BINAXNOW: SARS Coronavirus 2 Ag: NEGATIVE

## 2020-11-17 NOTE — Progress Notes (Signed)
Negative Covid lp

## 2020-11-17 NOTE — Progress Notes (Signed)
Virtual Visit via Telephone Note   This visit type was conducted due to national recommendations for restrictions regarding the COVID-19 Pandemic (e.g. social distancing) in an effort to limit this patient's exposure and mitigate transmission in our community.  Due to his co-morbid illnesses, this patient is at least at moderate risk for complications without adequate follow up.  This format is felt to be most appropriate for this patient at this time.  The patient did not have access to video technology/had technical difficulties with video requiring transitioning to audio format only (telephone).  All issues noted in this document were discussed and addressed.  No physical exam could be performed with this format.  Patient verbally consented to a telehealth visit.   Date:  11/17/2020   ID:  David Baxter, DOB 1940/12/14, MRN [id number]  Patient Location: Home Provider Location: Office/Clinic  PCP:  Abigail Miyamoto, MD   Evaluation Performed:  New Patient Evaluation  Chief Complaint:  Sick last Thursday, diarrhea, cold  History of Present Illness:    David Baxter is a 80 y.o. male with Sick last Thursday, diarrhea, cold, temp ok,     The patient does have symptoms concerning for COVID-19 infection (fever, chills, cough, or new shortness of breath).    Past Medical History:  Diagnosis Date  . Acquired hypothyroidism 04/08/2018  . Acute retention of urine 04/09/2018  . Acute sinusitis 06/05/2020  . AKI (acute kidney injury) (HCC) 04/08/2018  . Arthralgia of both lower legs   . BMI 23.0-23.9, adult 04/21/2020  . BPH (benign prostatic hyperplasia)   . 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 CAD (coronary artery disease)    cardiologist--  dr Dulce Sellar w/ [facility] in [place]  . CKD (chronic kidney disease) stage 3, GFR 30-59 ml/min (HCC) 07/06/2015  . CKD (chronic kidney disease), stage III (HCC)   . Coronary artery disease involving coronary bypass graft of native  heart 04/09/2015   Overview:  1. DES OM 2012    2. two DES to circumflex coronary artery 03/03/15  . 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 Essential hypertension 04/09/2015  . Gout 04/08/2018  . History of gout   . History of kidney stones   . History of stroke    per pt/ wife -- unknown when-- told this was on MRI in 2017  . HOH (hard of hearing)   . Hyperkalemia 05/09/2020  . Hyperlipemia, retention 04/09/2015  . Hyperlipidemia   . Hypertension   . Hypothyroidism   . S/P drug eluting coronary stent placement    2012-- x1DES to OM:  05/ 2016  x2 to CFX  . 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 SIRS (systemic inflammatory response syndrome) (HCC) 04/08/2018  . Type 2 diabetes mellitus (HCC) 04/08/2018  . UTI (urinary tract infection) 04/08/2018    Past Surgical History:  Procedure Laterality Date  . CORONARY ANGIOPLASTY WITH STENT PLACEMENT  03/04/2015   x2 DES to CFX  . CORONARY ANGIOPLASTY WITH STENT PLACEMENT  2012   x1 DES to OM  . CYSTOSCOPY WITH INSERTION OF UROLIFT N/A 02/14/2017   Procedure: CYSTOSCOPY WITH INSERTION OF UROLIFT;  Surgeon: Malen Gauze, MD;  Location: [facility];  Service: Urology;  Laterality: N/A;  . EXTRACORPOREAL SHOCK WAVE LITHOTRIPSY  1990's  . PERCUTANEOUS NEPHROSTOLITHOTOMY  1991   and abdominal hernia repair  . URETEROLITHOTOMY  1991 and 1994    Family History  Problem Relation Age of Onset  . Heart disease  Mother 33       After age 22 - CHF  . Hypertension Father   . Heart disease Father 9       After age 79 - CHF    Social History   Socioeconomic History  . Marital status: Married    Spouse name: Not on file  . Number of children: Not on file  . Years of education: Not on file  . Highest education level: Not on file  Occupational History  . Occupation: truck Hospital doctor  Tobacco Use  . Smoking status: Never Smoker  . Smokeless tobacco: Never Used  Substance and Sexual Activity  . Alcohol  use: No  . Drug use: No  . Sexual activity: Yes    Partners: Female  Other Topics Concern  . Not on file  Social History Narrative  . Not on file   Social Determinants of Health   Financial Resource Strain: Not on file  Food Insecurity: No Food Insecurity  . Worried About Programme researcher, broadcasting/film/video in the Last Year: Never true  . Ran Out of Food in the Last Year: Never true  Transportation Needs: Not on file  Physical Activity: Not on file  Stress: Not on file  Social Connections: Not on file  Intimate Partner Violence: Not on file    Outpatient Medications Prior to Visit  Medication Sig Dispense Refill  . ACCU-CHEK AVIVA PLUS test strip 1 each by Other route 2 (two) times daily. Use as instructed 100 each 2  . Accu-Chek FastClix Lancets MISC 1 each by Does not apply route in the morning and at bedtime. Use to check blood sugar twice daily as directed. 100 each 6  . allopurinol (ZYLOPRIM) 100 MG tablet TAKE 1 TABLET EVERY DAY 90 tablet 2  . aspirin EC 81 MG tablet Take 81 mg by mouth daily.    . bethanechol (URECHOLINE) 5 MG tablet TAKE 1 TABLET TWICE DAILY 180 tablet 3  . Cholecalciferol (VITAMIN D-1000 MAX ST) 25 MCG (1000 UT) tablet Take 2,000 Units by mouth.     . COLCRYS 0.6 MG tablet Prn gout flares    . finasteride (PROSCAR) 5 MG tablet TAKE 1 TABLET EVERY DAY 90 tablet 3  . Ginkgo Biloba (GINKOBA PO) Take by mouth.    . levothyroxine (SYNTHROID) 100 MCG tablet TAKE 1 TABLET BY MOUTH ONCE DAILY 90 tablet 2  . LOKELMA 10 g PACK packet Take 10 g by mouth daily.     . meclizine (ANTIVERT) 25 MG tablet Take by mouth. Takes OTC prn dizziness.    . metFORMIN (GLUCOPHAGE) 500 MG tablet TAKE 1 TABLET TWICE DAILY 180 tablet 2  . metoprolol tartrate (LOPRESSOR) 25 MG tablet Take 1 tablet (25 mg total) by mouth 2 (two) times daily. 180 tablet 2  . nitroGLYCERIN (NITROSTAT) 0.4 MG SL tablet Comments:   Patient Notes: Place 1 tablet under tongue as directed USE EVERY 5 MINUTES UP TO THREE  DOSES  Duration: 30    . simvastatin (ZOCOR) 40 MG tablet TAKE 1/2 TABLET EVERY DAY 45 tablet 2  . sodium bicarbonate 650 MG tablet Take by mouth 2 (two) times daily.    . vitamin B-12 (CYANOCOBALAMIN) 1000 MCG tablet Take 1,000 mcg by mouth daily.     No facility-administered medications prior to visit.    Allergies:   Tamsulosin and Aspirin   Social History   Tobacco Use  . Smoking status: Never Smoker  . Smokeless tobacco: Never Used  Substance Use  Topics  . Alcohol use: No  . Drug use: No     Review of Systems  Constitutional: Negative for chills and fever.  HENT: Positive for congestion. Negative for sore throat and tinnitus.   Eyes: Negative for redness.  Respiratory: Positive for cough.   Cardiovascular: Negative for chest pain and PND.  Gastrointestinal: Negative for melena.  Genitourinary: Negative for dysuria and urgency.  Musculoskeletal: Positive for myalgias.  Skin: Negative for itching and rash.  Neurological: Positive for headaches.  Psychiatric/Behavioral: Negative.      Labs/Other Tests and Data Reviewed:    Recent Labs: 08/19/2020: ALT 9; BUN 22; Creatinine, Ser 1.49; Hemoglobin 14.1; Platelets 331; Potassium 5.2; Sodium 146; TSH 2.690   Recent Lipid Panel Lab Results  Component Value Date/Time   CHOL 140 08/19/2020 09:32 AM   TRIG 87 08/19/2020 09:32 AM   HDL 44 08/19/2020 09:32 AM   CHOLHDL 3.2 08/19/2020 09:32 AM   LDLCALC 79 08/19/2020 09:32 AM    Wt Readings from Last 3 Encounters:  11/17/20 164 lb (74.4 kg)  09/24/20 164 lb 8 oz (74.6 kg)  08/19/20 164 lb 8 oz (74.6 kg)     Objective:    Vital Signs:  Temp (!) 96.7 F (35.9 C)   Ht 5\' 11"  (1.803 m)   Wt 164 lb (74.4 kg)   BMI 22.87 kg/m    Physical Exam vs stable   ASSESSMENT & PLAN:   Diagnoses and all orders for this visit: At increased risk of exposure to COVID-19 virus Covid negative       COVID-19 Education: The signs and symptoms of COVID-19 were discussed with  the patient and how to seek care for testing (follow up with PCP or arrange E-visit). The importance of social distancing was discussed today.   I spent 15 minutes dedicated to the care of this patient on the date of this encounter to include face-to-face time with the patient, as well as:   Follow Up:  In Person prn  Signed,  , MD  11/17/2020 11:24 AM    [facility] [place]

## 2020-11-18 LAB — NOVEL CORONAVIRUS, NAA: SARS-CoV-2, NAA: NOT DETECTED

## 2020-11-18 LAB — SARS-COV-2, NAA 2 DAY TAT

## 2020-11-19 NOTE — Progress Notes (Signed)
Covid negative lp

## 2020-12-11 NOTE — Progress Notes (Signed)
Chronic Care Management Pharmacy Note  12/12/2020 Name:  David Baxter MRN:  [id number] DOB:  [birth year]  Subjective: David Baxter is an 80 y.o. year old male who is a primary patient of David Baxter, David Comfort, MD.  The CCM team was consulted for assistance with disease management and care coordination needs.    Engaged with patient by telephone for follow up visit in response to provider referral for pharmacy case management and/or care coordination services.   Consent to Services:  The patient was given information about Chronic Care Management services, agreed to services, and gave verbal consent prior to initiation of services.  Please see initial visit note for detailed documentation.   Patient Care Team: Lillard Anes, MD as PCP - General (Family Medicine) Burnice Logan, [facility] as Pharmacist (Pharmacist)  Recent office visits:   Recent consult visits: 06/19/2020 - cardiology - stable CAD. Stable lipids and hypertensive kidney disease.   Hospital visits: None in previous 6 months  Objective:  Lab Results  Component Value Date   CREATININE 1.49 (H) 08/19/2020   BUN 22 08/19/2020   GFRNONAA 43 (L) 08/19/2020   GFRAA 50 (L) 08/19/2020   NA 146 (H) 08/19/2020   K 5.2 08/19/2020   CALCIUM 9.1 08/19/2020   CO2 28 08/19/2020    Lab Results  Component Value Date/Time   HGBA1C 5.9 (H) 08/19/2020 09:32 AM   HGBA1C 5.7 (H) 04/18/2020 08:26 AM    Last diabetic Eye exam:  Lab Results  Component Value Date/Time   HMDIABEYEEXA No Retinopathy 09/29/2020 12:00 AM    Last diabetic Foot exam: No results found for: HMDIABFOOTEX   Lab Results  Component Value Date   CHOL 140 08/19/2020   HDL 44 08/19/2020   LDLCALC 79 08/19/2020   TRIG 87 08/19/2020   CHOLHDL 3.2 08/19/2020    Hepatic Function Latest Ref Rng & Units 08/19/2020 04/23/2020 04/18/2020  Total Protein 6.0 - 8.5 g/dL 6.4 6.4 6.8  Albumin 3.6 - 4.6 g/dL 4.1 4.2 4.4  AST 0 - 40 IU/L _0 ALT 0 -  44 IU/L _1 Alk Phosphatase 44 - 121 IU/L 74 84 86  Total Bilirubin 0.0 - 1.2 mg/dL 0.4 0.4 0.4    Lab Results  Component Value Date/Time   TSH 2.690 08/19/2020 09:32 AM   TSH 2.160 04/18/2020 08:26 AM    CBC Latest Ref Rng & Units 08/19/2020 04/18/2020 02/14/2017  WBC 3.4 - 10.8 x10E3/uL 9.2 8.7 -  Hemoglobin 13.0 - 17.7 g/dL 14.1 14.3 16.3  Hematocrit 37.5 - 51.0 % 42.5 44.3 48.0  Platelets 150 - 450 x10E3/uL 331 341 -    No results found for: VD25OH  Clinical ASCVD: Yes  The ASCVD Risk score Mikey Bussing DC Jr., et al., 2013) failed to calculate for the following reasons:   The 2013 ASCVD risk score is only valid for ages 10 to 26    Depression screen PHQ 2/9 05/11/2020 04/18/2020  Decreased Interest 0 0  Down, Depressed, Hopeless 0 0  PHQ - 2 Score 0 0     Social History   Tobacco Use  Smoking Status Never Smoker  Smokeless Tobacco Never Used   BP Readings from Last 3 Encounters:  09/24/20 (!) 153/75  08/19/20 120/62  06/19/20 126/74   Pulse Readings from Last 3 Encounters:  09/24/20 63  08/19/20 64  06/19/20 69   Wt Readings from Last 3 Encounters:  11/17/20 164 lb (74.4 kg)  09/24/20  164 lb 8 oz (74.6 kg)  08/19/20 164 lb 8 oz (74.6 kg)    Assessment/Interventions: Review of patient past medical history, allergies, medications, health status, including review of consultants reports, laboratory and other test data, was performed as part of comprehensive evaluation and provision of chronic care management services.   SDOH:  (Social Determinants of Health) assessments and interventions performed: Yes   CCM Care Plan  Allergies  Allergen Reactions  . Tamsulosin Other (See Comments)  . Aspirin Other (See Comments)    Upset stomach    Medications Reviewed Today    Reviewed by Burnice Logan, [facility] (Pharmacist) on 12/12/20 at [phone]  Med List Status: <None>  Medication Order Taking? Sig Documenting Provider Last Dose Status Informant  ACCU-CHEK AVIVA PLUS test  strip [id number] Yes 1 each by Other route 2 (two) times daily. Use as instructed Lillard Anes, MD Taking Active   Accu-Chek FastClix Lancets [place] [id number] Yes 1 each by Does not apply route in the morning and at bedtime. Use to check blood sugar twice daily as directed. Lillard Anes, MD Taking Active   allopurinol [facility]) 100 MG tablet [id number] Yes TAKE 1 TABLET EVERY DAY Lillard Anes, MD Taking Active   aspirin EC 81 MG tablet [id number] Yes Take 81 mg by mouth daily. [provider] Taking Active   bethanechol (URECHOLINE) 5 MG tablet [id number] Yes TAKE 1 TABLET TWICE DAILY McKenzie, Candee Furbish, MD Taking Active   Cholecalciferol (VITAMIN D-1000 MAX ST) 25 MCG (1000 UT) tablet [id number] Yes Take 2,000 Units by mouth.  [provider] Taking Active   COLCRYS 0.6 MG tablet [id number] Yes Prn gout flares [provider] Taking Active Self  finasteride (PROSCAR) 5 MG tablet [id number] Yes TAKE 1 TABLET EVERY DAY McKenzie, Candee Furbish, MD Taking Active   Ginkgo Biloba [facility] PO) [id number] Yes Take by mouth. [provider] Taking Active   levothyroxine (SYNTHROID) 100 MCG tablet [id number] Yes TAKE 1 TABLET BY MOUTH ONCE DAILY Lillard Anes, MD Taking Active   LOKELMA 10 g PACK packet [id number] Yes Take 10 g by mouth daily.  [provider] Taking Active Self  meclizine (ANTIVERT) 25 MG tablet [id number] Yes Take by mouth. Takes OTC prn dizziness. [provider] Taking Active Self  metFORMIN (GLUCOPHAGE) 500 MG tablet [id number] Yes TAKE 1 TABLET TWICE DAILY Lillard Anes, MD Taking Active   metoprolol tartrate (LOPRESSOR) 25 MG tablet [id number] Yes Take 1 tablet (25 mg total) by mouth 2 (two) times daily. Lillard Anes, MD Taking Active   nitroGLYCERIN (NITROSTAT) 0.4 MG SL tablet [id number] Yes Comments:   Patient Notes: Place 1 tablet under tongue as directed USE EVERY 5 MINUTES UP TO THREE  DOSES  Duration: 30 [provider] Taking Active   simvastatin (ZOCOR) 40 MG tablet [id number] Yes TAKE 1/2 TABLET EVERY DAY Lillard Anes, MD Taking Active   sodium bicarbonate 650 MG tablet [id number] Yes Take by mouth 2 (two) times daily. [provider] Taking Active   vitamin B-12 (CYANOCOBALAMIN) 1000 MCG tablet [id number] Yes Take 1,000 mcg by mouth daily. [provider] Taking Active           Patient Active Problem List   Diagnosis Date Noted  . At increased risk of exposure to COVID-19 virus 11/17/2020  . S/P drug eluting coronary stent placement   . Hypothyroidism   . Hypertension   . Hyperlipidemia   . HOH (hard of hearing)   .  History of stroke   . History of kidney stones   . History of gout   . CKD (chronic kidney disease), stage III ([place])   . CAD (coronary artery disease)   . BPH (benign prostatic hyperplasia)   . Arthralgia of both lower legs   . Acute sinusitis 06/05/2020  . Hyperkalemia 05/09/2020  . BMI 23.0-23.9, adult 04/21/2020  . 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 Benign localized prostatic hyperplasia with lower urinary tract symptoms (LUTS) 12/05/2019  . Retention, urine 04/09/2018  . Acquired hypothyroidism 04/08/2018  . Gout 04/08/2018  . AKI (acute kidney injury) ([place]) 04/08/2018  . SIRS (systemic inflammatory response syndrome) ([place]) 04/08/2018  . UTI (urinary tract infection) 04/08/2018  . Acute kidney failure, unspecified ([place]) 04/08/2018  . Type 2 diabetes mellitus ([place]) 04/08/2018  . CKD (chronic kidney disease) stage 3, GFR 30-59 ml/min 07/06/2015  . 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 Hyperlipemia, retention 04/09/2015  . Essential hypertension 04/09/2015    Immunization History  Administered Date(s) Administered  . Fluad Quad(high Dose 65+) 08/19/2020  . Moderna SARS-COV2 Booster  Vaccination 08/19/2020  . Moderna Sars-Covid-2 Vaccination 11/22/2019, 12/18/2019    Conditions to be addressed/monitored:  Hypertension, Hyperlipidemia, Diabetes, Chronic Kidney Disease and Hypothyroidism  Care Plan : [place]  Updates made by Burnice Logan, RPH since 12/12/2020 12:00 AM    Problem: dm, htn, hld, cad   Priority: High  Onset Date: 12/12/2020    Long-Range Goal: Disease Management   Start Date: 12/12/2020  Expected End Date: 12/12/2021  This Visit's Progress: On track  Priority: High  Note:        Current Barriers:  . Unable to independently afford treatment regimen   Pharmacist Clinical Goal(s):  Marland Kitchen Over the next 90 days, patient will verbalize ability to afford treatment regimen . maintain control of diabetes as evidenced by a1c  through collaboration with PharmD and provider.   Interventions: . 1:1 collaboration with Lillard Anes, MD regarding development and update of comprehensive plan of care as evidenced by provider attestation and co-signature . Inter-disciplinary care team collaboration (see longitudinal plan of care) . Comprehensive medication review performed; medication list updated in electronic medical record  Hypertension (BP goal <130/80) -Controlled -Current treatment: . Metoprolol tartrate 25 mg bid  -Medications previously tried: none noted -Current home readings: 120-130/70s -Current dietary habits: low potassium diet -Current exercise habits: stays active around his house -Denies hypotensive/hypertensive symptoms -Educated on BP goals and benefits of medications for prevention of heart attack, stroke and kidney damage; Daily salt intake goal < 2300 mg; Exercise goal of 150 minutes per week; Importance of home blood pressure monitoring; -Counseled to monitor BP at home weekly, document, and provide log at future appointments -Counseled on diet and exercise extensively Recommended to continue current  medication  Hyperlipidemia: (LDL goal < 55) -Not ideally controlled -Current treatment: . Simvastatin 40 mg - 1/2 tablet daily  -Medications previously tried: none reported  -Current dietary patterns: low potassium diet.  -Current exercise habits: travels frequently to visit family. Works around his home.  -Educated on Cholesterol goals;  Benefits of statin for ASCVD risk reduction; Importance of limiting foods high in cholesterol; Exercise goal of 150 minutes per week; -Counseled on diet and exercise extensively Recommended to continue current medication  Diabetes (A1c goal <7%) -Controlled -Current medications: Marland Kitchen Metformin 500 mg bid  -Medications previously tried: none reported  -Current  home glucose readings . fasting glucose: 80-90 mg/dL . post prandial glucose: none reported -Denies hypoglycemic/hyperglycemic symptoms -Current meal patterns:  . Eats a variety of fruits and vegetables. Limits potassium in diet due to kidney.  -Current exercise: stays active around home.  -Educated onA1c and blood sugar goals; Complications of diabetes including kidney damage, retinal damage, and cardiovascular disease; Exercise goal of 150 minutes per week; Benefits of routine self-monitoring of blood sugar; -Counseled to check feet daily and get yearly eye exams -Counseled on diet and exercise extensively Recommended to continue current medication  Hypothyroidism (Goal: manage TSH) -Controlled -Current treatment  . Levothyroxine 100 mcg daily -Medications previously tried: none reported  -Recommended to continue current medication  Health Maintenance -Vaccine gaps: recommended updating tetanus shot  -Counseled on benefits of Tetanus and shingles shot.    Patient Goals/Self-Care Activities . Over the next 90 days, patient will:  - take medications as prescribed focus on medication adherence by using pill box check glucose daily , document, and provide at future  appointments check blood pressure weekly, document, and provide at future appointments  Follow Up Plan: Telephone follow up appointment with care management team member scheduled for: 05/2021      Medication Assistance: [place]el[place]ained through [facility]  medication assistance program.  Enrollment ends 10/17/2021  Patient's preferred pharmacy is:  [facility], [place] - [address] [place] [address] Phone: [phone] Fax: [phone]  [facility] - [address], [place] [address] Phone: [phone] Fax: [phone]  Uses pill box? Yes Pt endorses 100% compliance  We discussed: Current pharmacy is preferred with insurance plan and patient is satisfied with pharmacy services Patient decided to: Continue current medication management strategy  Care Plan and Follow Up Patient Decision:  Patient agrees to Care Plan and Follow-up.  Plan: Telephone follow up appointment with care management team member scheduled for:  05/2021

## 2020-12-12 ENCOUNTER — Other Ambulatory Visit: Payer: Self-pay

## 2020-12-12 ENCOUNTER — Ambulatory Visit (INDEPENDENT_AMBULATORY_CARE_PROVIDER_SITE_OTHER): Payer: Medicare PPO

## 2020-12-12 DIAGNOSIS — I10 Essential (primary) hypertension: Secondary | ICD-10-CM

## 2020-12-12 DIAGNOSIS — E1121 Type 2 diabetes mellitus with diabetic nephropathy: Secondary | ICD-10-CM

## 2020-12-12 DIAGNOSIS — I25708 Atherosclerosis of coronary artery bypass graft(s), unspecified, with other forms of angina pectoris: Secondary | ICD-10-CM

## 2020-12-12 DIAGNOSIS — E782 Mixed hyperlipidemia: Secondary | ICD-10-CM

## 2020-12-12 NOTE — Patient Instructions (Signed)
Visit Information  Goals Addressed            This Visit's Progress   . Manage My Medicine       Timeframe:  Long-Range Goal Priority:  High Start Date:         12/12/20                     Expected End Date:  12/12/21                      Follow Up Date 05/27/2021    - call for medicine refill 2 or 3 days before it runs out - keep a list of all the medicines I take; vitamins and herbals too - use a pillbox to sort medicine    Why is this important?   . These steps will help you keep on track with your medicines.   Notes:     . Set My Target A1C-Diabetes Type 2       Timeframe:  Long-Range Goal Priority:  High Start Date:     12/12/2020                        Expected End Date:    12/12/2021                   Follow Up Date 05/27/2022    - set target A1C    Why is this important?    Your target A1C is decided together by you and your doctor.   It is based on several things like your age and other health issues.    Notes:     . Track and Manage My Blood Pressure-Hypertension       Timeframe:  Long-Range Goal Priority:  High Start Date:        12/12/2020                     Expected End Date:    12/12/2021                   Follow Up Date 05/27/2022    - check blood pressure weekly - choose a place to take my blood pressure (home, clinic or office, retail store) - write blood pressure results in a log or diary    Why is this important?    You won't feel high blood pressure, but it can still hurt your blood vessels.   High blood pressure can cause heart or kidney problems. It can also cause a stroke.   Making lifestyle changes like losing a little weight or eating less salt will help.   Checking your blood pressure at home and at different times of the day can help to control blood pressure.   If the doctor prescribes medicine remember to take it the way the doctor ordered.   Call the office if you cannot afford the medicine or if there are questions  about it.     Notes:       Patient Care Plan: CCM Pharmacy Care Plan    Problem Identified: dm, htn, hld, cad   Priority: High  Onset Date: 12/12/2020    Long-Range Goal: Disease Management   Start Date: 12/12/2020  Expected End Date: 12/12/2021  This Visit's Progress: On track  Priority: High  Note:        Current Barriers:  . Unable to independently afford treatment regimen  Pharmacist Clinical Goal(s):  Marland Kitchen Over the next 90 days, patient will verbalize ability to afford treatment regimen . maintain control of diabetes as evidenced by a1c  through collaboration with PharmD and provider.   Interventions: . 1:1 collaboration with Abigail Miyamoto, MD regarding development and update of comprehensive plan of care as evidenced by provider attestation and co-signature . Inter-disciplinary care team collaboration (see longitudinal plan of care) . Comprehensive medication review performed; medication list updated in electronic medical record  Hypertension (BP goal <130/80) -Controlled -Current treatment: . Metoprolol tartrate 25 mg bid  -Medications previously tried: none noted -Current home readings: 120-130/70s -Current dietary habits: low potassium diet -Current exercise habits: stays active around his house -Denies hypotensive/hypertensive symptoms -Educated on BP goals and benefits of medications for prevention of heart attack, stroke and kidney damage; Daily salt intake goal < 2300 mg; Exercise goal of 150 minutes per week; Importance of home blood pressure monitoring; -Counseled to monitor BP at home weekly, document, and provide log at future appointments -Counseled on diet and exercise extensively Recommended to continue current medication  Hyperlipidemia: (LDL goal < 55) -Not ideally controlled -Current treatment: . Simvastatin 40 mg - 1/2 tablet daily  -Medications previously tried: none reported  -Current dietary patterns: low potassium diet.   -Current exercise habits: travels frequently to visit family. Works around his home.  -Educated on Cholesterol goals;  Benefits of statin for ASCVD risk reduction; Importance of limiting foods high in cholesterol; Exercise goal of 150 minutes per week; -Counseled on diet and exercise extensively Recommended to continue current medication  Diabetes (A1c goal <7%) -Controlled -Current medications: Marland Kitchen Metformin 500 mg bid  -Medications previously tried: none reported  -Current home glucose readings . fasting glucose: 80-90 mg/dL . post prandial glucose: none reported -Denies hypoglycemic/hyperglycemic symptoms -Current meal patterns:  . Eats a variety of fruits and vegetables. Limits potassium in diet due to kidney.  -Current exercise: stays active around home.  -Educated onA1c and blood sugar goals; Complications of diabetes including kidney damage, retinal damage, and cardiovascular disease; Exercise goal of 150 minutes per week; Benefits of routine self-monitoring of blood sugar; -Counseled to check feet daily and get yearly eye exams -Counseled on diet and exercise extensively Recommended to continue current medication  Hypothyroidism (Goal: manage TSH) -Controlled -Current treatment  . Levothyroxine 100 mcg daily -Medications previously tried: none reported  -Recommended to continue current medication  Health Maintenance -Vaccine gaps: recommended updating tetanus shot  -Counseled on benefits of Tetanus and shingles shot.    Patient Goals/Self-Care Activities . Over the next 90 days, patient will:  - take medications as prescribed focus on medication adherence by using pill box check glucose daily , document, and provide at future appointments check blood pressure weekly, document, and provide at future appointments  Follow Up Plan: Telephone follow up appointment with care management team member scheduled for: 05/2021      The patient verbalized understanding  of instructions, educational materials, and care plan provided today and declined offer to receive copy of patient instructions, educational materials, and care plan.  Telephone follow up appointment with pharmacy team member scheduled for: 05/2021  ERICSON NAFZIGER, [facility]  Food Basics for Chronic Kidney Disease Chronic kidney disease (CKD) occurs when the kidneys are permanently damaged over a long period of time. When your kidneys are not working well, they cannot remove waste, fluids, and other substances from your blood as well as they did before. The substances can build up, which can worsen  kidney damage and affect how your body functions. Certain foods lead to a buildup of these substances. By changing your diet, you can help prevent more kidney damage and delay or prevent the need for dialysis. What are tips for following this plan? Reading food labels  Check the amount of salt (sodium) in foods. Choose foods that have less than 300 milligrams (mg) per serving.  Check the ingredient list for phosphorus or potassium-based additives or preservatives.  Check the amount of saturated fat and trans fat. Limit or avoid these fats as told by your dietitian. Shopping  Avoid buying foods that are: ? Processed or prepackaged. ? Calcium-enriched or that have calcium added to them (are fortified).  Do not buy foods that have salt or sodium listed among the first five ingredients.  Buy canned vegetables and beans that say "no salt added" or "low sodium" and rinse them before eating. Cooking  Soak vegetables, such as potatoes, before cooking to reduce potassium. To do this: 1. Peel and cut the vegetables into small pieces. 2. Soak the vegetables in warm water for at least 2 hours. For every 1 cup of vegetables, use 10 cups of water. 3. Drain and rinse the vegetables with warm water. 4. Boil the vegetables for at least 5 minutes. Meal planning  Limit the amount of protein you eat from plant and  animal sources each day.  Do not add salt to food when cooking or before eating.  Eat meals and snacks at around the same time each day. General information  Talk with your health care provider about whether you should take a vitamin and mineral supplement.  Use standard measuring cups and spoons to measure servings of foods. Use a kitchen scale to measure portions of protein foods.  If told by your health care provider, avoid drinking too much fluid. Measure and count all liquids, including water, ice, soups, flavored gelatin, and frozen desserts such as ice pops or ice cream. If you have diabetes:  If you have diabetes (diabetes mellitus) and CKD, it is important to keep your blood sugar (glucose) in the target range recommended by your health care provider. Follow your diabetes management plan. This may include: ? Checking your blood glucose regularly. ? Taking medicines by mouth, taking insulin, or taking both. ? Exercising for at least 30 minutes on 5 or more days each week, or as told by your health care provider. ? Tracking how many servings of carbohydrates you eat at each meal.  You may be given specific guidelines on how much of certain foods and nutrients you may eat, depending on your stage of kidney disease and whether you have high blood pressure (hypertension). Follow your meal plan as told by your dietitian. What nutrients should I limit? Work with your health care provider and dietitian to develop a meal plan that is right for you. Foods you can eat and foods you should limit or avoid will depend on the stage of your kidney disease and any other health conditions you have. The items listed below are not a complete list. Talk with your dietitian about what dietary choices are best for you. Potassium Potassium affects how steadily your heart beats. If too much potassium builds up in your blood, the potassium can cause an irregular heartbeat or even a heart attack. You may need  to limit or avoid foods that are high in potassium, such as:  Milk and soy milk.  Fruits, such as bananas, apricots, nectarines, melon, prunes, raisins,  kiwi, and oranges.  Vegetables, such as potatoes, sweet potatoes, yams, tomatoes, leafy greens, beets, avocado, pumpkin, and winter squash.  White and lima beans.  Whole-wheat breads and pastas.  Beans and nuts. Phosphorus Phosphorus is a mineral found in your bones. A balance between calcium and phosphorus is needed to build and maintain healthy bones. Too much phosphorus pulls calcium from your bones. This can make your bones weak and more likely to break. Too much phosphorus can also make your skin itch. You may need to limit or avoid foods that are high in phosphorus, such as:  Milk and dairy products.  Dried beans and peas.  Tofu, soy milk, and other soy-based meat replacements.  Dark-colored sodas.  Nuts and peanut butter.  Meat, poultry, and fish.  Bran cereals and oatmeal. Protein Protein helps you make and keep muscle. It also helps to repair your body's cells and tissues. One of the natural breakdown products of protein is a waste product called urea. When your kidneys are not working properly, they cannot remove wastes, such as urea. Reducing how much protein you eat can help prevent a buildup of urea in your blood. Depending on your stage of kidney disease, you may need to limit foods that are high in protein. Sources of animal protein include:  Meat (all types).  Fish and seafood.  Poultry.  Eggs.  Dairy. Other protein foods include:  Beans and legumes.  Nuts and nut butter.  Soy and tofu.   Sodium Sodium helps to maintain a healthy balance of fluids in your body. Too much sodium can increase your blood pressure and have a negative effect on your heart and lungs. Too much sodium can also cause your body to retain too much fluid, making your kidneys work harder. Most people should have less than 2,300  mg of sodium each day. If you have hypertension, you may need to limit your sodium to 1,500 mg each day. You may need to limit or avoid foods that are high in sodium, such as:  Salt seasonings.  Soy sauce.  Cured and processed meats.  Salted crackers and snack foods.  Fast food.  Canned soups and most canned foods.  Pickled foods.  Vegetable juice.  Boxed mixes or ready-to-eat boxed meals and side dishes.  Bottled dressings, sauces, and marinades. Talk with your dietitian about how much potassium, phosphorus, protein, and sodium you may have each day. Summary  Chronic kidney disease (CKD) can lead to a buildup of waste and extra substances in the body. Certain foods lead to a buildup of these substances. By changing your diet as told, you can help prevent more kidney damage and delay or prevent the need for dialysis.  Food intake changes are different for each person with CKD. Work with a dietitian to set up nutrient goals and a meal plan that is right for you.  If you have diabetes and CKD, it is important to keep your blood sugar in the target range recommended by your health care provider. This information is not intended to replace advice given to you by your health care provider. Make sure you discuss any questions you have with your health care provider. Document Revised: 01/28/2020 Document Reviewed: 01/28/2020 Elsevier Patient Education  2021 [employer].

## 2020-12-23 ENCOUNTER — Ambulatory Visit: Payer: Medicare PPO | Admitting: Legal Medicine

## 2020-12-23 ENCOUNTER — Encounter: Payer: Self-pay | Admitting: Legal Medicine

## 2020-12-23 ENCOUNTER — Other Ambulatory Visit: Payer: Self-pay

## 2020-12-23 VITALS — BP 130/70 | HR 70 | Temp 97.0°F | Resp 18 | Wt 167.0 lb

## 2020-12-23 DIAGNOSIS — R251 Tremor, unspecified: Secondary | ICD-10-CM | POA: Diagnosis not present

## 2020-12-23 DIAGNOSIS — I10 Essential (primary) hypertension: Secondary | ICD-10-CM | POA: Diagnosis not present

## 2020-12-23 DIAGNOSIS — N401 Enlarged prostate with lower urinary tract symptoms: Secondary | ICD-10-CM | POA: Diagnosis not present

## 2020-12-23 DIAGNOSIS — E039 Hypothyroidism, unspecified: Secondary | ICD-10-CM | POA: Diagnosis not present

## 2020-12-23 DIAGNOSIS — E1121 Type 2 diabetes mellitus with diabetic nephropathy: Secondary | ICD-10-CM | POA: Diagnosis not present

## 2020-12-23 DIAGNOSIS — I25708 Atherosclerosis of coronary artery bypass graft(s), unspecified, with other forms of angina pectoris: Secondary | ICD-10-CM

## 2020-12-23 DIAGNOSIS — E211 Secondary hyperparathyroidism, not elsewhere classified: Secondary | ICD-10-CM

## 2020-12-23 DIAGNOSIS — E782 Mixed hyperlipidemia: Secondary | ICD-10-CM

## 2020-12-23 LAB — POCT UA - MICROALBUMIN: Microalbumin Ur, POC: 80 mg/L

## 2020-12-23 NOTE — Progress Notes (Signed)
Subjective:  Patient ID: David Baxter, male    DOB: 02-Mar-1941  Age: 80 y.o. MRN: [id number]  Chief Complaint  Patient presents with  . Hypertension  . Hyperlipidemia  . Tremors    HPI: chronic visit  Patient notes tremor on right hand, resting., MMSE 27/30. No dysphagia.  Some problem starting an stopping.    Patient presents for follow up of hypertension.  Patient tolerating metoprolol well with side effects.  Patient was diagnosed with hypertension 2010 so has been treated for hypertension for 10 years.Patient is working on maintaining diet and exercise regimen and follows up as directed. Complication include none.  Patient presents with hyperlipidemia.  Compliance with treatment has been good; patient takes medicines as directed, maintains low cholesterol diet, follows up as directed, and maintains exercise regimen.  Patient is using simvastatin without problems.   Patient present with type 2 diabetes.  Specifically, this is type 2, noninsulin requiring diabetes, complicated by nephropathy.  Compliance with treatment has been good; patient take medicines as directed, maintains diet and exercise regimen, follows up as directed, and is keeping glucose diary.  Date of  diagnosis 2010.  Depression screen has been performed.Tobacco screen nonsmoker. Current medicines for diabetes metformin.  Patient is on none for renal protection and simvastatin for cholesterol control.  Patient performs foot exams daily and last ophthalmologic exam was yes. Current Outpatient Medications on File Prior to Visit  Medication Sig Dispense Refill  . ACCU-CHEK AVIVA PLUS test strip 1 each by Other route 2 (two) times daily. Use as instructed 100 each 2  . Accu-Chek FastClix Lancets MISC 1 each by Does not apply route in the morning and at bedtime. Use to check blood sugar twice daily as directed. 100 each 6  . allopurinol (ZYLOPRIM) 100 MG tablet TAKE 1 TABLET EVERY DAY 90 tablet 2  . aspirin EC 81 MG tablet  Take 81 mg by mouth daily.    . bethanechol (URECHOLINE) 5 MG tablet TAKE 1 TABLET TWICE DAILY 180 tablet 3  . Cholecalciferol (VITAMIN D-1000 MAX ST) 25 MCG (1000 UT) tablet Take 2,000 Units by mouth.     . COLCRYS 0.6 MG tablet Prn gout flares    . finasteride (PROSCAR) 5 MG tablet TAKE 1 TABLET EVERY DAY 90 tablet 3  . Ginkgo Biloba (GINKOBA PO) Take by mouth.    . levothyroxine (SYNTHROID) 100 MCG tablet TAKE 1 TABLET BY MOUTH ONCE DAILY 90 tablet 2  . LOKELMA 10 g PACK packet Take 10 g by mouth daily.     . meclizine (ANTIVERT) 25 MG tablet Take by mouth. Takes OTC prn dizziness.    . metFORMIN (GLUCOPHAGE) 500 MG tablet TAKE 1 TABLET TWICE DAILY 180 tablet 2  . metoprolol tartrate (LOPRESSOR) 25 MG tablet Take 1 tablet (25 mg total) by mouth 2 (two) times daily. 180 tablet 2  . nitroGLYCERIN (NITROSTAT) 0.4 MG SL tablet Comments:   Patient Notes: Place 1 tablet under tongue as directed USE EVERY 5 MINUTES UP TO THREE DOSES  Duration: 30    . simvastatin (ZOCOR) 40 MG tablet TAKE 1/2 TABLET EVERY DAY 45 tablet 2  . sodium bicarbonate 650 MG tablet Take by mouth 2 (two) times daily.    . vitamin B-12 (CYANOCOBALAMIN) 1000 MCG tablet Take 1,000 mcg by mouth daily.     No current facility-administered medications on file prior to visit.   Past Medical History:  Diagnosis Date  . Acquired hypothyroidism 04/08/2018  . Acute retention  of urine 04/09/2018  . Acute sinusitis 06/05/2020  . AKI (acute kidney injury) (HCC) 04/08/2018  . Arthralgia of both lower legs   . BMI 23.0-23.9, adult 04/21/2020  . BPH (benign prostatic hyperplasia)   . 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 CAD (coronary artery disease)    cardiologist--  dr Dulce Sellar w/ [facility] in [place]  . CKD (chronic kidney disease) stage 3, GFR 30-59 ml/min (HCC) 07/06/2015  . CKD (chronic kidney disease), stage III (HCC)   . 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 DES OM 2012    2. two DES to circumflex coronary artery 03/03/15  . 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 Essential hypertension 04/09/2015  . Gout 04/08/2018  . History of gout   . History of kidney stones   . History of stroke    per pt/ wife -- unknown when-- told this was on MRI in 2017  . HOH (hard of hearing)   . Hyperkalemia 05/09/2020  . Hyperlipemia, retention 04/09/2015  . Hyperlipidemia   . Hypertension   . Hypothyroidism   . S/P drug eluting coronary stent placement    2012-- x1DES to OM:  05/ 2016  x2 to CFX  . 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 SIRS (systemic inflammatory response syndrome) (HCC) 04/08/2018  . Type 2 diabetes mellitus (HCC) 04/08/2018  . UTI (urinary tract infection) 04/08/2018   Past Surgical History:  Procedure Laterality Date  . CORONARY ANGIOPLASTY WITH STENT PLACEMENT  03/04/2015   x2 DES to CFX  . CORONARY ANGIOPLASTY WITH STENT PLACEMENT  2012   x1 DES to OM  . CYSTOSCOPY WITH INSERTION OF UROLIFT N/A 02/14/2017   Procedure: CYSTOSCOPY WITH INSERTION OF UROLIFT;  Surgeon: Malen Gauze, MD;  Location: [facility];  Service: Urology;  Laterality: N/A;  . EXTRACORPOREAL SHOCK WAVE LITHOTRIPSY  1990's  . PERCUTANEOUS NEPHROSTOLITHOTOMY  1991   and abdominal hernia repair  . URETEROLITHOTOMY  1991 and 1994    Family History  Problem Relation Age of Onset  . Heart disease Mother 71       After age 32 - CHF  . Hypertension Father   . Heart disease Father 48       After age 62 - CHF   Social History   Socioeconomic History  . Marital status: Married    Spouse name: Not on file  . Number of children: Not on file  . Years of education: Not on file  . Highest education level: Not on file  Occupational History  . Occupation: truck Hospital doctor  Tobacco Use  . Smoking status: Never Smoker  . Smokeless tobacco: Never Used  Substance and Sexual Activity  . Alcohol use: No  . Drug use: No  . Sexual  activity: Yes    Partners: Female  Other Topics Concern  . Not on file  Social History Narrative  . Not on file   Social Determinants of Health   Financial Resource Strain: Not on file  Food Insecurity: No Food Insecurity  . Worried About Programme researcher, broadcasting/film/video in the Last Year: Never true  . Ran Out of Food in the Last Year: Never true  Transportation Needs: Not on file  Physical Activity: Not on file  Stress: Not on file  Social Connections: Not on file    Review of Systems  Constitutional: Negative for activity change and diaphoresis.  Eyes: Negative for visual disturbance.  Respiratory: Negative for shortness of breath.   Cardiovascular: Negative for chest pain and leg swelling.  Gastrointestinal: Negative for abdominal distention and abdominal pain.  Endocrine: Negative for polyuria.  Genitourinary: Negative for difficulty urinating.  Musculoskeletal: Negative for arthralgias and back pain.  Skin: Negative.   Neurological: Positive for tremors.  Psychiatric/Behavioral: Negative.      Objective:  BP 130/70   Pulse 70   Temp (!) 97 F (36.1 C)   Resp 18   Wt 167 lb (75.8 kg)   SpO2 99%   BMI 23.29 kg/m   BP/Weight 12/23/2020 11/17/2020 09/24/2020  Systolic BP 130 - 153  Diastolic BP 70 - 75  Wt. (Lbs) 167 164 164.5  BMI 23.29 22.87 22.31    Physical Exam Vitals reviewed.  Constitutional:      General: He is not in acute distress.    Appearance: Normal appearance.  HENT:     Head: Normocephalic.     Right Ear: Tympanic membrane, ear canal and external ear normal.     Left Ear: Tympanic membrane and ear canal normal.     Mouth/Throat:     Mouth: Mucous membranes are moist.     Pharynx: Oropharynx is clear.  Eyes:     Extraocular Movements: Extraocular movements intact.     Conjunctiva/sclera: Conjunctivae normal.     Pupils: Pupils are equal, round, and reactive to light.  Cardiovascular:     Rate and Rhythm: Regular rhythm.     Pulses: Normal pulses.      Heart sounds: Gallop (S4) present.   Pulmonary:     Effort: Pulmonary effort is normal. No respiratory distress.     Breath sounds: Normal breath sounds.  Abdominal:     General: Abdomen is flat. Bowel sounds are normal. There is no distension.     Tenderness: There is no abdominal tenderness.  Musculoskeletal:        General: No tenderness. Normal range of motion.  Skin:    General: Skin is warm.     Capillary Refill: Capillary refill takes less than 2 seconds.  Neurological:     Mental Status: He is alert and oriented to person, place, and time. Mental status is at baseline.     Comments: Right hand resting tremor, pronation, supination No cogwheel rigidity , no mask like facies  Psychiatric:        Mood and Affect: Mood normal.        Thought Content: Thought content normal.        Judgment: Judgment normal.     Diabetic Foot Exam - Simple   Simple Foot Form Diabetic Foot exam was performed with the following findings: Yes 12/23/2020  8:39 AM  Visual Inspection See comments: Yes Sensation Testing Intact to touch and monofilament testing bilaterally: Yes Pulse Check See comments: Yes Comments Bunion, slow capillary filling,       Lab Results  Component Value Date   WBC 9.2 08/19/2020   HGB 14.1 08/19/2020   HCT 42.5 08/19/2020   PLT 331 08/19/2020   GLUCOSE 99 08/19/2020   CHOL 140 08/19/2020   TRIG 87 08/19/2020   HDL 44 08/19/2020   LDLCALC 79 08/19/2020   ALT 9 08/19/2020   AST 13 08/19/2020   NA 146 (H) 08/19/2020   K 5.2 08/19/2020   CL 104 08/19/2020   CREATININE 1.49 (H) 08/19/2020   BUN 22 08/19/2020   CO2 28 08/19/2020   TSH 2.690 08/19/2020   HGBA1C 5.9 (H) 08/19/2020  MICROALBUR 80 12/23/2020      Assessment & Plan:  Diagnoses and all orders for this visit: Essential hypertension -     CBC with Differential/Platelet -     Comprehensive metabolic panel An individual hypertension care plan was established and reinforced today.  The  patient's status was assessed using clinical findings on exam and labs or diagnostic tests. The patient's success at meeting treatment goals on disease specific evidence-based guidelines and found to be well controlled. SELF MANAGEMENT: The patient and I together assessed ways to personally work towards obtaining the recommended goals. RECOMMENDATIONS: avoid decongestants found in common cold remedies, decrease consumption of alcohol, perform routine monitoring of BP with home BP cuff, exercise, reduction of dietary salt, take medicines as prescribed, try not to miss doses and quit smoking.  Regular exercise and maintaining a healthy weight is needed.  Stress reduction may help. A CLINICAL SUMMARY including written plan identify barriers to care unique to individual due to social or financial issues.  We attempt to mutually creat solutions for individual and family understanding.  Diabetic glomerulopathy (HCC) -     Hemoglobin A1c -     POCT UA - Microalbumin An individual care plan for diabetes was established and reinforced today.  The patient's status was assessed using clinical findings on exam, labs and diagnostic testing. Patient success at meeting goals based on disease specific evidence-based guidelines and found to be fair controlled. Medications were assessed and patient's understanding of the medical issues , including barriers were assessed. Recommend adherence to a diabetic diet, a graduated exercise program, HgbA1c level is checked quarterly, and urine microalbumin performed yearly .  Annual mono-filament sensation testing performed. Lower blood pressure and control hyperlipidemia is important. Get annual eye exams and annual flu shots and smoking cessation discussed.  Self management goals were discussed. Secondary hyperparathyroidism, non-renal (HCC) Patient with hyperparathyroidism, last calcium normal, BP controlled  Coronary artery disease of bypass graft of native heart with stable  angina pectoris (HCC) Patient's CAD was assessed using history and physical along with other information to maximize treatment.  Evidence based criteria was use in deciding proper management for this disease process.  Patient's CAD is under good control.therapy continue present treatment . Acquired hypothyroidism Patient is known to have hypothyroidism and is n treatment with levothyroxin .  Patient was diagnosed 10 years ago.  Other treatment includes none.  Patient is compliant with medicines and last TSH 6 months ago.  Last TSH was normal. Benign localized prostatic hyperplasia with lower urinary tract symptoms (LUTS) -     PSA AN INDIVIDUAL CARE PLAN was established and reinforced today.  The patient's status was assessed using clinical findings on exam, labs, and other diagnostic testing. Patient's success at meeting treatment goals based on disease specific evidence-bassed guidelines and found to be in good control. RECOMMENDATIONS include maintaining present medicines and treatment. Mixed hyperlipidemia -     Lipid panel AN INDIVIDUAL CARE PLAN for hyperlipidemia/ cholesterol was established and reinforced today.  The patient's status was assessed using clinical findings on exam, lab and other diagnostic tests. The patient's disease status was assessed based on evidence-based guidelines and found to be well controlled. MEDICATIONS were reviewed. SELF MANAGEMENT GOALS have been discussed and patient's success at attaining the goal of low cholesterol was assessed. RECOMMENDATION given include regular exercise 3 days a week and low cholesterol/low fat diet. CLINICAL SUMMARY including written plan to identify barriers unique to the patient due to social or economic  reasons  was discussed. Tremor -     Ambulatory referral to Neurology Patient has a new resting tremor on right hand, no cowgheel rigidity or mask-like facies.       I spent 40 minutes dedicated to the care of this patient  on the date of this encounter to include face-to-face time with the patient, as well as: review old records  Follow-up: Return in about 4 months (around 04/24/2021) for fasting.  An After Visit Summary was printed and given to the patient.  Brent BullaLawrence Saprina Chuong, MD [facility] [phone]

## 2020-12-24 LAB — COMPREHENSIVE METABOLIC PANEL
ALT: 8 IU/L (ref 0–44)
AST: 13 IU/L (ref 0–40)
Albumin/Globulin Ratio: 1.8 (ref 1.2–2.2)
Albumin: 4.4 g/dL (ref 3.6–4.6)
Alkaline Phosphatase: 80 IU/L (ref 44–121)
BUN/Creatinine Ratio: 18 (ref 10–24)
BUN: 27 mg/dL (ref 8–27)
Bilirubin Total: 0.4 mg/dL (ref 0.0–1.2)
CO2: 25 mmol/L (ref 20–29)
Calcium: 9.3 mg/dL (ref 8.6–10.2)
Chloride: 103 mmol/L (ref 96–106)
Creatinine, Ser: 1.54 mg/dL — ABNORMAL HIGH (ref 0.76–1.27)
Globulin, Total: 2.4 g/dL (ref 1.5–4.5)
Glucose: 108 mg/dL — ABNORMAL HIGH (ref 65–99)
Potassium: 5.2 mmol/L (ref 3.5–5.2)
Sodium: 144 mmol/L (ref 134–144)
Total Protein: 6.8 g/dL (ref 6.0–8.5)
eGFR: 45 mL/min/{1.73_m2} — ABNORMAL LOW (ref >59)

## 2020-12-24 LAB — CBC WITH DIFFERENTIAL/PLATELET
Basophils Absolute: 0.1 10*3/uL (ref 0.0–0.2)
Basos: 1 %
EOS (ABSOLUTE): 0.7 10*3/uL — ABNORMAL HIGH (ref 0.0–0.4)
Eos: 6 %
Hematocrit: 44.1 % (ref 37.5–51.0)
Hemoglobin: 14.3 g/dL (ref 13.0–17.7)
Immature Grans (Abs): 0 10*3/uL (ref 0.0–0.1)
Immature Granulocytes: 0 %
Lymphocytes Absolute: 3.1 10*3/uL (ref 0.7–3.1)
Lymphs: 28 %
MCH: 28.9 pg (ref 26.6–33.0)
MCHC: 32.4 g/dL (ref 31.5–35.7)
MCV: 89 fL (ref 79–97)
Monocytes Absolute: 1 10*3/uL — ABNORMAL HIGH (ref 0.1–0.9)
Monocytes: 9 %
Neutrophils Absolute: 6.1 10*3/uL (ref 1.4–7.0)
Neutrophils: 56 %
Platelets: 318 10*3/uL (ref 150–450)
RBC: 4.94 x10E6/uL (ref 4.14–5.80)
RDW: 13.8 % (ref 11.6–15.4)
WBC: 10.9 10*3/uL — ABNORMAL HIGH (ref 3.4–10.8)

## 2020-12-24 LAB — HEMOGLOBIN A1C
Est. average glucose Bld gHb Est-mCnc: 120 mg/dL
Hgb A1c MFr Bld: 5.8 % — ABNORMAL HIGH (ref 4.8–5.6)

## 2020-12-24 LAB — LIPID PANEL
Chol/HDL Ratio: 2.8 ratio (ref 0.0–5.0)
Cholesterol, Total: 127 mg/dL (ref 100–199)
HDL: 45 mg/dL (ref >39)
LDL Chol Calc (NIH): 65 mg/dL (ref 0–99)
Triglycerides: 88 mg/dL (ref 0–149)
VLDL Cholesterol Cal: 17 mg/dL (ref 5–40)

## 2020-12-24 LAB — CARDIOVASCULAR RISK ASSESSMENT

## 2020-12-24 LAB — PSA: Prostate Specific Ag, Serum: 0.2 ng/mL (ref 0.0–4.0)

## 2020-12-24 NOTE — Progress Notes (Signed)
Cbc normal, glucose 108, kidney tests stable, liver tests normal, A1c 5.8 good, Cholesterol normal, PSA 0.2 normal,  lp

## 2021-02-06 ENCOUNTER — Telehealth: Payer: Self-pay

## 2021-02-06 NOTE — Telephone Encounter (Signed)
Patient's wife calling about having both upcoming appt's done on the same day.   They live too far to be driving.  Call back at:  [phone]  Thanks, Rosey Bath

## 2021-02-11 ENCOUNTER — Telehealth: Payer: Self-pay

## 2021-02-11 NOTE — Progress Notes (Signed)
Chronic Care Management Pharmacy Assistant   Name: David Baxter  MRN: [id number] DOB: [birth year]  Reason for Encounter: Disease State for diabetes    Recent office visits:  12/23/20- Dr. Marina Goodell PCP, HTN, Labs, neurology referral for hand tremor,Cbc normal, glucose 108, kidney tests stable, liver tests normal, A1c 5.8 good, Cholesterol normal, PSA 0.2 normal  2/25/22Lucia Baxter, CPP  Recent consult visits:  none  Hospital visits:  None in previous 6 months  Medications: Outpatient Encounter Medications as of 02/11/2021  Medication Sig  . ACCU-CHEK AVIVA PLUS test strip 1 each by Other route 2 (two) times daily. Use as instructed  . Accu-Chek FastClix Lancets MISC 1 each by Does not apply route in the morning and at bedtime. Use to check blood sugar twice daily as directed.  Marland Kitchen allopurinol (ZYLOPRIM) 100 MG tablet TAKE 1 TABLET EVERY DAY  . aspirin EC 81 MG tablet Take 81 mg by mouth daily.  . bethanechol (URECHOLINE) 5 MG tablet TAKE 1 TABLET TWICE DAILY  . Cholecalciferol (VITAMIN D-1000 MAX ST) 25 MCG (1000 UT) tablet Take 2,000 Units by mouth.   . COLCRYS 0.6 MG tablet Prn gout flares  . finasteride (PROSCAR) 5 MG tablet TAKE 1 TABLET EVERY DAY  . Ginkgo Biloba (GINKOBA PO) Take by mouth.  . levothyroxine (SYNTHROID) 100 MCG tablet TAKE 1 TABLET BY MOUTH ONCE DAILY  . LOKELMA 10 g PACK packet Take 10 g by mouth daily.   . meclizine (ANTIVERT) 25 MG tablet Take by mouth. Takes OTC prn dizziness.  . metFORMIN (GLUCOPHAGE) 500 MG tablet TAKE 1 TABLET TWICE DAILY  . metoprolol tartrate (LOPRESSOR) 25 MG tablet Take 1 tablet (25 mg total) by mouth 2 (two) times daily.  . nitroGLYCERIN (NITROSTAT) 0.4 MG SL tablet Comments:   Patient Notes: Place 1 tablet under tongue as directed USE EVERY 5 MINUTES UP TO THREE DOSES  Duration: 30  . simvastatin (ZOCOR) 40 MG tablet TAKE 1/2 TABLET EVERY DAY  . sodium bicarbonate 650 MG tablet Take by mouth 2 (two) times daily.  . vitamin B-12  (CYANOCOBALAMIN) 1000 MCG tablet Take 1,000 mcg by mouth daily.   No facility-administered encounter medications on file as of 02/11/2021.    Recent Relevant Labs: Lab Results  Component Value Date/Time   HGBA1C 5.8 (H) 12/23/2020 09:02 AM   HGBA1C 5.9 (H) 08/19/2020 09:32 AM   MICROALBUR 80 12/23/2020 09:03 AM    Kidney Function Lab Results  Component Value Date/Time   CREATININE 1.54 (H) 12/23/2020 09:02 AM   CREATININE 1.49 (H) 08/19/2020 09:32 AM   GFRNONAA 43 (L) 08/19/2020 09:32 AM   GFRAA 50 (L) 08/19/2020 09:32 AM   Unable to reach patient  . Current antihyperglycemic regimen:  Metformin   Adherence Review: Is the patient currently on a STATIN medication? Yes Is the patient currently on ACE/ARB medication? Yes Does the patient have >5 day gap between last estimated fill dates? CPP to review  Star Rating Drugs:  Medication:  Last Fill: Day Supply  Metoprolol                  06/18/20                90 Simvastatin                 04/13/20              90  David Baxter, [place] Clinical Pharmacist Assistant [phone]

## 2021-02-25 ENCOUNTER — Ambulatory Visit: Payer: Medicare PPO | Admitting: Neurology

## 2021-02-25 ENCOUNTER — Encounter: Payer: Self-pay | Admitting: Neurology

## 2021-02-25 ENCOUNTER — Other Ambulatory Visit: Payer: Self-pay

## 2021-02-25 VITALS — BP 158/86 | HR 65 | Ht 71.0 in | Wt 166.0 lb

## 2021-02-25 DIAGNOSIS — R251 Tremor, unspecified: Secondary | ICD-10-CM

## 2021-02-25 NOTE — Patient Instructions (Signed)
It was nice to meet you both today.  You have a rather mild and intermittent tremor of both hands, it is only noticeable on the left currently. I do not see any signs or symptoms of parkinson's like disease or what we call parkinsonism.   For your intermittent tremor, I would not recommend any new medication.  We do not have to make a follow up appointment.   Please remember, that any kind of tremor may be exacerbated by anxiety, anger, nervousness, excitement, dehydration, sleep deprivation, by caffeine, thyroid disease, and low blood sugar values or blood sugar fluctuations.   Please try to hydrate better with water, try to get about 6 cups of water per day unless advised otherwise by your urologist or nephrologist.  I would like for you to consider reducing your caffeine intake and limit yourself to 2 servings per day.  This includes your coffee in the morning and tea with lunch.

## 2021-02-25 NOTE — Progress Notes (Signed)
Subjective:    Patient ID: David Baxter is a 80 y.o. male.  HPI     Star Age, MD, PhD [facility] [address], [address] [place], [place] [address]  Dear Dr. Henrene Pastor,  I saw your patient, David Baxter, upon your kind request in my neurologic clinic today for initial consultation of his hand tremor.  The patient is accompanied by his wife today.  As you know, David Baxter is an 80 year old right-handed gentleman with an underlying complex medical history of coronary artery disease with status post stent, chronic kidney disease, acquired hypothyroidism, diabetic glomerulopathy, hypertension, gout, kidney stones, secondary hyperparathyroidism, diabetes, history of suspected stroke (per wife some 88 y ago, never confirmed), hearing loss (uses an OTC amplifying device), hyperlipidemia, arthralgia, and BPH, who reports an approximately 35-monthhistory of intermittent hand tremors.  He has noticed this primarily when he holds something or tries to read a book and turn the pages.  Sometimes he gets aggravated but as such it does not impair him in his day-to-day activities.  He denies noticing any lower extremity tremor or tremor at rest.  I reviewed your office note from 12/23/2020.  He was noted to have a right hand resting tremor. He had blood work through your office at the time, A1c was 5.8, CBC showed WBC of 10.9 which is borderline elevated, monocytes absolute were borderline elevated at 1.0 and eosinophils were slightly elevated at 0.7.  CMP showed glucose of 108, creatinine 1.54, otherwise AST and ALT as well as alk phos were unremarkable.  Lipid panel showed benign findings, LDL of 65.  PSA was normal at 0.2.  Last TSH from 08/19/2020 was normal at 2.69.  He has no family history of tremors or Parkinson's disease.  He had 3 brothers, 1 brother died in infancy, 1 brother died from hemorrhage at age 426 another brother died some 3 to 4 years ago in his 754sfrom a car  accident.  He lives with his wife, they have 2 daughters and 1 son, none with tremor issues.  He admits that he does not like to drink any water.  He drinks diluted apple juice to take his potassium supplement.  He does not drink any alcohol, he is a non-smoker.   Wife reports that he likes to drink tea.  He drinks 1 or 2 cups of coffee in the morning and about 3 glasses of tea per day.  He has fallen.  He felt some 4 to 5 weeks ago from a ladder.  He was cleaning the gutters.  He fell and landed on his back, was able to get up on his own, did not hit his head, did not lose consciousness, had some discomfort in his lower back.  He sees a urologist and a nephrologist.  His Past Medical History Is Significant For: Past Medical History:  Diagnosis Date  . Acquired hypothyroidism 04/08/2018  . Acute retention of urine 04/09/2018  . Acute sinusitis 06/05/2020  . [place] 04/08/[place]  . Arthralgia of both lower legs   . BMI 23.0-23.9, adult 04/21/2020  . BPH (benign prostatic hyperplasia)   . 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 CAD (coronary artery disease)    cardiologist--  [facility] mBettina [facility]aviaw[facility] [place] . CKD (chronic kidney disease) stage 3, GFR 30-59 ml/min (HCC) 07/06/2015  . CKD (chronic kidney disease), stage III ([place]   . Coronary artery disease involving coronary bypass graft of  native heart 04/09/2015   Overview:  1. DES OM 2012    2. two DES to circumflex coronary artery 03/03/15  . Diabetic glomerulopathy ([place]) 12/05/2019  . Essential hypertension 04/09/2015  . Gout 04/08/2018  . History of gout   . History of kidney stones   . History of stroke    per pt/ wife -- unknown when-- told this was on MRI in 2017  . HOH (hard of hearing)   . Hyperkalemia 05/09/2020  . Hyperlipemia, retention 04/09/2015  . Hyperlipidemia   . Hypertension   . Hypothyroidism   . S/P drug eluting coronary stent placement    2012-- x1DES to OM:  05/ 2016  x2 to CFX   . Secondary hyperparathyroidism, non-renal ([place]) 12/05/2019  . SIRS (systemic inflammatory response syndrome) ([place]) 04/08/2018  . Type 2 diabetes mellitus ([place]) 04/08/2018  . UTI (urinary tract infection) 04/08/2018    His Past Surgical History Is Significant For: Past Surgical History:  Procedure Laterality Date  . CORONARY ANGIOPLASTY WITH STENT PLACEMENT  03/04/2015   x2 DES to CFX  . CORONARY ANGIOPLASTY WITH STENT PLACEMENT  2012   x1 DES to OM  . CYSTOSCOPY WITH INSERTION OF UROLIFT N/A 02/14/2017   Procedure: CYSTOSCOPY WITH INSERTION OF UROLIFT;  Surgeon: Cleon Gustin, MD;  Location: [facility];  Service: Urology;  Laterality: N/A;  . EXTRACORPOREAL SHOCK WAVE LITHOTRIPSY  1990's  . PERCUTANEOUS NEPHROSTOLITHOTOMY  1991   and abdominal hernia repair  . URETEROLITHOTOMY  1991 and 1994    His Family History Is Significant For: Family History  Problem Relation Age of Onset  . Heart disease Mother 47       After age 4 - CHF  . Hypertension Father   . Heart disease Father 48       After age 13 - CHF    His Social History Is Significant For: Social History   Socioeconomic History  . Marital status: Married    Spouse name: Not on file  . Number of children: Not on file  . Years of education: Not on file  . Highest education level: Not on file  Occupational History  . Occupation: truck Geophysicist/field seismologist  Tobacco Use  . Smoking status: Never Smoker  . Smokeless tobacco: Never Used  Substance and Sexual Activity  . Alcohol use: No  . Drug use: No  . Sexual activity: Yes    Partners: Female  Other Topics Concern  . Not on file  Social History Narrative  . Not on file   Social Determinants of Health   Financial Resource Strain: Not on file  Food Insecurity: No Food Insecurity  . Worried About Charity fundraiser in the Last Year: Never true  . Ran Out of Food in the Last Year: Never true  Transportation Needs: Not on file  Physical Activity: Not on  file  Stress: Not on file  Social Connections: Not on file    His Allergies Are:  Allergies  Allergen Reactions  . Tamsulosin Other (See Comments)  . Aspirin Other (See Comments)    Upset stomach  :   His Current Medications Are:  Outpatient Encounter Medications as of 02/25/2021  Medication Sig  . ACCU-CHEK AVIVA PLUS test strip 1 each by Other route 2 (two) times daily. Use as instructed  . Accu-Chek FastClix Lancets MISC 1 each by Does not apply route in the morning and at bedtime. Use to check blood sugar twice daily as directed.  Marland Kitchen  allopurinol (ZYLOPRIM) 100 MG tablet TAKE 1 TABLET EVERY DAY  . aspirin EC 81 MG tablet Take 81 mg by mouth daily.  . bethanechol (URECHOLINE) 5 MG tablet TAKE 1 TABLET TWICE DAILY  . Cholecalciferol (VITAMIN D-1000 MAX ST) 25 MCG (1000 UT) tablet Take 2,000 Units by mouth.   . COLCRYS 0.6 MG tablet Prn gout flares  . finasteride (PROSCAR) 5 MG tablet TAKE 1 TABLET EVERY DAY  . levothyroxine (SYNTHROID) 100 MCG tablet TAKE 1 TABLET BY MOUTH ONCE DAILY  . LOKELMA 10 g PACK packet Take 10 g by mouth daily.   . meclizine (ANTIVERT) 25 MG tablet Take by mouth. Takes OTC prn dizziness.  . metFORMIN (GLUCOPHAGE) 500 MG tablet TAKE 1 TABLET TWICE DAILY  . metoprolol tartrate (LOPRESSOR) 25 MG tablet Take 1 tablet (25 mg total) by mouth 2 (two) times daily.  . nitroGLYCERIN (NITROSTAT) 0.4 MG SL tablet Comments:   Patient Notes: Place 1 tablet under tongue as directed USE EVERY 5 MINUTES UP TO THREE DOSES  Duration: 30  . simvastatin (ZOCOR) 40 MG tablet TAKE 1/2 TABLET EVERY DAY  . sodium bicarbonate 650 MG tablet Take by mouth 2 (two) times daily.  . vitamin B-12 (CYANOCOBALAMIN) 1000 MCG tablet Take 1,000 mcg by mouth daily.  . [DISCONTINUED] Ginkgo Biloba (GINKOBA PO) Take by mouth.   No facility-administered encounter medications on file as of 02/25/2021.  :   Review of Systems:  Out of a complete 14 point review of systems, all are reviewed and  negative with the exception of these symptoms as listed below:  Review of Systems  Neurological:       Here for consult on tremors. Reports sx are located in bilateral hands. Reports sx are equal in both, he is right handed. Pt reports he has not noticed any triggers for the sx and have been present over the last 6 months.     Objective:  Neurological Exam  Physical Exam Physical Examination:   Vitals:   02/25/21 1017  BP: (!) 158/86  Pulse: 65  SpO2: 94%   General Examination: The patient is a very pleasant 80 y.o. male in no acute distress. He appears well-developed and well-nourished and well groomed.   HEENT: Normocephalic, atraumatic, pupils are equal, round and reactive to light. Extraocular tracking is good without limitation to gaze excursion or nystagmus noted. Normal smooth pursuit is noted.  Hearing is impaired.  He has an over-the-counter hearing device in the right ear.  Face is symmetric with normal facial animation, speech is clear without dysarthria or hypophonia.  He has no lip, neck or jaw tremor, neck is supple, no nuchal rigidity noted, good passive and active mobility, no carotid bruits.  Airway examination reveals mild mouth dryness, adequate dental hygiene. Tongue protrudes centrally and palate elevates symmetrically.   Chest: Clear to auscultation without wheezing, rhonchi or crackles noted.  Heart: S1+S2+0, regular and normal without murmurs, rubs or gallops noted.   Abdomen: Soft, non-tender and non-distended with normal bowel sounds appreciated on auscultation.  Extremities: There is no pitting edema in the distal lower extremities bilaterally.  Skin: Warm and dry without trophic changes note.  Some chronic appearing bruises across forearms and distal lower extremities.  Musculoskeletal: exam reveals no obvious joint deformities, tenderness or joint swelling or erythema.   Neurologically:  Mental status: The patient is awake, alert and oriented in all 4  spheres. His immediate and remote memory, attention, language skills and fund of knowledge are appropriate. There  is no evidence of aphasia, agnosia, apraxia or anomia. Speech is clear with normal prosody and enunciation. Thought process is linear. Mood is normal and affect is normal.  Cranial nerves II - XII are as described above under HEENT exam. In addition: shoulder shrug is normal with equal shoulder height noted. Motor exam: Normal bulk, strength and tone is noted. There is no drift, resting tremor or rebound.  No bradykinesia, no rigidity.  Fine motor skills with finger taps, hand movements, rapid alternating patting and foot taps are fairly well-preserved for age.  No decrement in amplitude noted.  On 02/25/2021: On Archimedes spiral drawing he has slight insecurity but no consistent tremor with the left hand, no significant trembling with the right hand which is his dominant hand, handwriting is legible, not tremulous, not particularly micrographic. He has no resting, postural, action or intention tremor in the right upper extremity, he has a slight action tremor in the left upper extremity, no lower extremity tremor.  Reflexes are 1+ in the upper extremities, trace in the knees and absent in the ankles. Toes are flexor bilaterally. Fine motor skills and coordination: intact with normal finger taps, normal hand movements, normal rapid alternating patting, normal foot taps and normal foot agility.  Cerebellar testing: No dysmetria or intention tremor on finger to nose testing. Heel to shin is unremarkable bilaterally. There is no truncal or gait ataxia.  Sensory exam: intact to light touch in the upper and lower extremities.  Gait, station and balance: He stands easily. No veering to one side is noted. No leaning to one side is noted. Posture is age-appropriate and stance is narrow based. Gait shows normal stride length and normal pace.  He has a preserved arm swing.  Slight difficulty/insecurity  with turns.  Balance is fairly well-preserved for age.  He has no walking aid.    Assessment and Plan:   In summary, David Baxter is a very pleasant 80 y.o.-year old male with an underlying complex medical history of coronary artery disease with status post stent, chronic kidney disease, acquired hypothyroidism, diabetic glomerulopathy, hypertension, gout, kidney stones, secondary hyperparathyroidism, diabetes, history of suspected stroke (per wife some 36 y ago, never confirmed), hearing loss (uses an OTC amplifying device), hyperlipidemia, arthralgia, and BPH, who presents for evaluation of his tremors of approximately 6 months duration with intermittent symptoms noted particularly in the left hand.  Examination does not show any significant tremor at this time, no telltale parkinsonism.  He is largely reassured at this time.  I had a long discussion with the patient and his wife regarding tremor triggers, causes of tremor.  He is advised to try to hydrate better with water, limit his caffeine intake to about 2 servings per day if possible.  I did not suggest we add any symptomatic treatment for tremor, given his chronic medical conditions, multiple medications already, and chronic kidney impairment, and in particular because of mild findings.  He is advised to follow-up with your office routinely and his other specialists.  I would be happy to see him in the future if the need arises.  He is advised not to climb any ladders.  He had a recent fall, he was on a ladder.  We talked about the importance of fall prevention.  We also talked about the importance of maintaining a healthy lifestyle.  His wife added that he has a tendency to skip meals, if he does not eat breakfast, he may have lunch but if he has breakfast,  he may not eat anything for lunch.  He is advised to be mindful of his nutrition and not to skip any meals.  I answered all her questions today and the patient and his wife were in  agreement. Thank you very much for allowing me to participate in the care of this nice patient. If I can be of any further assistance to you please do not hesitate to call me at [phone].  Sincerely,   Star Age, MD, PhD

## 2021-03-10 ENCOUNTER — Telehealth: Payer: Self-pay

## 2021-03-10 NOTE — Progress Notes (Signed)
Chronic Care Management Pharmacy Assistant   Name: David Baxter  MRN: [id number] DOB: 11-18-1940  Reason for Encounter: Disease State for diabetes    Recent office visits:  12/23/20- Dr. Marina Goodell PCP, HTN, Labs, neurology referral for hand tremor,Cbc normal, glucose 108, kidney tests stable, liver tests normal, A1c 5.8 good, Cholesterol normal, PSA 0.2 normal  Recent consult visits:  02/25/21-Neurology, Ginkgo Biloba stopped,  initial consultation of his hand tremor, He felt some 4 to 5 weeks ago from a ladder.  He was cleaning the gutters.  He fell and landed on his back, was able to get up on his own, did not hit his head, did not lose consciousness, had some discomfort in his lower back.  He sees a urologist and a nephrologist.  Hospital visits:  None in previous 6 months  Medications: Outpatient Encounter Medications as of 03/10/2021  Medication Sig  . ACCU-CHEK AVIVA PLUS test strip 1 each by Other route 2 (two) times daily. Use as instructed  . Accu-Chek FastClix Lancets MISC 1 each by Does not apply route in the morning and at bedtime. Use to check blood sugar twice daily as directed.  Marland Kitchen allopurinol (ZYLOPRIM) 100 MG tablet TAKE 1 TABLET EVERY DAY  . aspirin EC 81 MG tablet Take 81 mg by mouth daily.  . bethanechol (URECHOLINE) 5 MG tablet TAKE 1 TABLET TWICE DAILY  . Cholecalciferol (VITAMIN D-1000 MAX ST) 25 MCG (1000 UT) tablet Take 2,000 Units by mouth.   . COLCRYS 0.6 MG tablet Prn gout flares  . finasteride (PROSCAR) 5 MG tablet TAKE 1 TABLET EVERY DAY  . levothyroxine (SYNTHROID) 100 MCG tablet TAKE 1 TABLET BY MOUTH ONCE DAILY  . LOKELMA 10 g PACK packet Take 10 g by mouth daily.   . meclizine (ANTIVERT) 25 MG tablet Take by mouth. Takes OTC prn dizziness.  . metFORMIN (GLUCOPHAGE) 500 MG tablet TAKE 1 TABLET TWICE DAILY  . metoprolol tartrate (LOPRESSOR) 25 MG tablet Take 1 tablet (25 mg total) by mouth 2 (two) times daily.  . nitroGLYCERIN (NITROSTAT) 0.4 MG SL tablet  Comments:   Patient Notes: Place 1 tablet under tongue as directed USE EVERY 5 MINUTES UP TO THREE DOSES  Duration: 30  . simvastatin (ZOCOR) 40 MG tablet TAKE 1/2 TABLET EVERY DAY  . sodium bicarbonate 650 MG tablet Take by mouth 2 (two) times daily.  . vitamin B-12 (CYANOCOBALAMIN) 1000 MCG tablet Take 1,000 mcg by mouth daily.   No facility-administered encounter medications on file as of 03/10/2021.    Recent Relevant Labs: Lab Results  Component Value Date/Time   HGBA1C 5.8 (H) 12/23/2020 09:02 AM   HGBA1C 5.9 (H) 08/19/2020 09:32 AM   MICROALBUR 80 12/23/2020 09:03 AM    Kidney Function Lab Results  Component Value Date/Time   CREATININE 1.54 (H) 12/23/2020 09:02 AM   CREATININE 1.49 (H) 08/19/2020 09:32 AM   GFRNONAA 43 (L) 08/19/2020 09:32 AM   GFRAA 50 (L) 08/19/2020 09:32 AM     . Current antihyperglycemic regimen:  Metformin   Patient verbally confirms he is taking the above medications as directed. Yes  . What recent interventions/DTPs have been made to improve glycemic control:  Per patient wife, I asked her about his Metoprolol, she sated he had some from previous fill, then hers got changed, so they also have that, wife stated he still has 1 bottle left.    . Patient denies hypoglycemic symptoms, including None  . Patient denies hyperglycemic symptoms, including  none  . How often are you checking your blood sugar? once daily  . What are your blood sugars ranging? Wife stated they normally range from 80-100, on occasion it will get over 100 if he has ate something he should not have.   . On insulin? No  . During the week, how often does your blood glucose drop below 70? Never  . Are you checking your feet daily/regularly? Yes  Adherence Review: Is the patient currently on a STATIN medication? Yes Is the patient currently on ACE/ARB medication? Yes Does the patient have >5 day gap between last estimated fill dates? CPP to review  Star Rating Drugs:   Medication:  Last Fill: Day Supply Metoprolol  06/18/20  90- she sated he had some from previous fill, then hers got                                                    changed, so they also have that, wife stated he still has 1 bottle left.   Simvastatin-                                    Per wife he takes 1/2 daily, he has had a recent fill Metformin-   05/13/20 Wife said he has 1 bottle from previous fills    Leilani Able, [facility] Clinical Pharmacist Assistant [phone]

## 2021-03-24 ENCOUNTER — Ambulatory Visit (HOSPITAL_COMMUNITY): Payer: Medicare PPO

## 2021-03-27 DIAGNOSIS — N189 Chronic kidney disease, unspecified: Secondary | ICD-10-CM | POA: Diagnosis not present

## 2021-03-27 DIAGNOSIS — N183 Chronic kidney disease, stage 3 unspecified: Secondary | ICD-10-CM | POA: Diagnosis not present

## 2021-03-27 DIAGNOSIS — E039 Hypothyroidism, unspecified: Secondary | ICD-10-CM | POA: Diagnosis not present

## 2021-03-27 DIAGNOSIS — I10 Essential (primary) hypertension: Secondary | ICD-10-CM | POA: Diagnosis not present

## 2021-03-27 DIAGNOSIS — R809 Proteinuria, unspecified: Secondary | ICD-10-CM | POA: Diagnosis not present

## 2021-03-27 DIAGNOSIS — D649 Anemia, unspecified: Secondary | ICD-10-CM | POA: Diagnosis not present

## 2021-03-27 DIAGNOSIS — R309 Painful micturition, unspecified: Secondary | ICD-10-CM | POA: Diagnosis not present

## 2021-03-27 DIAGNOSIS — E211 Secondary hyperparathyroidism, not elsewhere classified: Secondary | ICD-10-CM | POA: Diagnosis not present

## 2021-03-27 DIAGNOSIS — E875 Hyperkalemia: Secondary | ICD-10-CM | POA: Diagnosis not present

## 2021-03-27 DIAGNOSIS — E559 Vitamin D deficiency, unspecified: Secondary | ICD-10-CM | POA: Diagnosis not present

## 2021-03-27 DIAGNOSIS — E1169 Type 2 diabetes mellitus with other specified complication: Secondary | ICD-10-CM | POA: Diagnosis not present

## 2021-03-31 ENCOUNTER — Encounter: Payer: Self-pay | Admitting: Urology

## 2021-03-31 ENCOUNTER — Other Ambulatory Visit: Payer: Self-pay

## 2021-03-31 ENCOUNTER — Ambulatory Visit (HOSPITAL_COMMUNITY)
Admission: RE | Admit: 2021-03-31 | Discharge: 2021-03-31 | Disposition: A | Discharge status: Home / Self Care | Payer: Medicare PPO | Source: Ambulatory Visit | Attending: Urology | Admitting: Urology

## 2021-03-31 ENCOUNTER — Telehealth (INDEPENDENT_AMBULATORY_CARE_PROVIDER_SITE_OTHER): Payer: Medicare PPO | Admitting: Urology

## 2021-03-31 DIAGNOSIS — R39198 Other difficulties with micturition: Secondary | ICD-10-CM | POA: Diagnosis not present

## 2021-03-31 DIAGNOSIS — R339 Retention of urine, unspecified: Secondary | ICD-10-CM

## 2021-03-31 DIAGNOSIS — N401 Enlarged prostate with lower urinary tract symptoms: Secondary | ICD-10-CM

## 2021-03-31 DIAGNOSIS — N281 Cyst of kidney, acquired: Secondary | ICD-10-CM | POA: Diagnosis not present

## 2021-03-31 DIAGNOSIS — N1330 Unspecified hydronephrosis: Secondary | ICD-10-CM

## 2021-03-31 DIAGNOSIS — N400 Enlarged prostate without lower urinary tract symptoms: Secondary | ICD-10-CM | POA: Diagnosis not present

## 2021-03-31 HISTORY — DX: Unspecified hydronephrosis: N13.30

## 2021-03-31 MED ORDER — FINASTERIDE 5 MG PO TABS: 5.0000 mg | ORAL_TABLET | Freq: Every day | ORAL | 3 refills | Status: DC

## 2021-03-31 MED ORDER — BETHANECHOL CHLORIDE 5 MG PO TABS: 5.0000 mg | ORAL_TABLET | Freq: Two times a day (BID) | ORAL | 3 refills | Status: DC

## 2021-03-31 NOTE — Progress Notes (Signed)
03/31/2021 1:28 PM   David Baxter October 09, 1941 [id number]  Referring provider: Abigail Miyamoto, MD [address] [place],  [place] [address]  Patient location: home Physician location: office I connected with  David Baxter on 03/31/21 by a video enabled telemedicine application and verified that I am speaking with the correct person using two identifiers.   I discussed the limitations of evaluation and management by telemedicine. The patient expressed understanding and agreed to proceed.    Followup BPH and hydronephrosis   HPI: [name] is a 79yo here for followup for BPH and hydronephrosis. Renal US today shows no hydronephrosis and 640cc in his bladder. He denies any worsening LUTS. NO UTIs. He is currently on bethenacol and finasteride. Overall he is happy with his urination. No other complaints   PMH: Past Medical History:  Diagnosis Date   Acquired hypothyroidism 04/08/2018   Acute retention of urine 04/09/2018   Acute sinusitis 06/05/2020   AKI (acute kidney injury) (HCC) 04/08/2018   Arthralgia of both lower legs    BMI 23.0-23.9, adult 04/21/2020   BPH (benign prostatic hyperplasia)    BPH with urinary obstruction 12/05/2019   CAD (coronary artery disease)    cardiologist--  dr David Baxter w/ [facility] in [place]   CKD (chronic kidney disease) stage 3, GFR 30-59 ml/min (HCC) 07/06/2015   CKD (chronic kidney disease), stage III (HCC)    Coronary artery disease involving coronary bypass graft of native heart 04/09/2015   Overview:  1. DES OM 2012    2. 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   SIRS (systemic inflammatory response syndrome) (HCC) 04/08/2018   Type 2 diabetes mellitus (HCC) 04/08/2018   UTI (urinary tract infection) 04/08/2018    Surgical History: Past Surgical History:  Procedure Laterality Date   CORONARY ANGIOPLASTY WITH STENT PLACEMENT  03/04/2015   x2 DES to CFX   CORONARY ANGIOPLASTY WITH STENT PLACEMENT  2012   x1 DES to OM   CYSTOSCOPY WITH INSERTION OF UROLIFT N/A 02/14/2017   Procedure: CYSTOSCOPY WITH INSERTION OF UROLIFT;  Surgeon: Malen Gauze, MD;  Location: [facility];  Service: Urology;  Laterality: N/A;   EXTRACORPOREAL SHOCK WAVE LITHOTRIPSY  1990's   PERCUTANEOUS NEPHROSTOLITHOTOMY  1991   and abdominal hernia repair   URETEROLITHOTOMY  1991 and 1994    Home Medications:  Allergies as of 03/31/2021       Reactions   Tamsulosin Other (See Comments)   Aspirin Other (See Comments)   Upset stomach        Medication List        Accurate as of March 31, 2021  1:28 PM. If you have any questions, ask your nurse or doctor.          Accu-Chek Aviva Plus test strip Generic drug: glucose blood 1 each by Other  route 2 (two) times daily. Use as instructed   Accu-Chek FastClix Lancets Misc 1 each by Does not apply route in the morning and at bedtime. Use to check blood sugar twice daily as directed.   allopurinol 100 MG tablet Commonly known as: ZYLOPRIM TAKE 1 TABLET EVERY DAY   aspirin EC 81 MG tablet Take 81 mg by mouth daily.   bethanechol 5 MG tablet Commonly known as: URECHOLINE TAKE 1 TABLET TWICE DAILY   Colcrys 0.6 MG tablet Generic drug: colchicine Prn gout flares   finasteride 5 MG tablet Commonly known as: PROSCAR TAKE 1 TABLET EVERY DAY   levothyroxine 100 MCG tablet Commonly known as: SYNTHROID TAKE 1 TABLET BY MOUTH ONCE  DAILY   Lokelma 10 g Pack packet Generic drug: sodium zirconium cyclosilicate Take 10 g by mouth daily.   meclizine 25 MG tablet Commonly known as: ANTIVERT Take by mouth. Takes OTC prn dizziness.   metFORMIN 500 MG tablet Commonly known as: GLUCOPHAGE TAKE 1 TABLET TWICE DAILY   metoprolol tartrate 25 MG tablet Commonly known as: LOPRESSOR Take 1 tablet (25 mg total) by mouth 2 (two) times daily.   nitroGLYCERIN 0.4 MG SL tablet Commonly known as: NITROSTAT Comments:   Patient Notes: Place 1 tablet under tongue as directed USE EVERY 5 MINUTES UP TO THREE DOSES  Duration: 30   simvastatin 40 MG tablet Commonly known as: ZOCOR TAKE 1/2 TABLET EVERY DAY   sodium bicarbonate 650 MG tablet Take by mouth 2 (two) times daily.   vitamin B-12 1000 MCG tablet Commonly known as: CYANOCOBALAMIN Take 1,000 mcg by mouth daily.   Vitamin D-1000 Max St 25 MCG (1000 UT) tablet Generic drug: Cholecalciferol Take 2,000 Units by mouth.        Allergies:  Allergies  Allergen Reactions   Tamsulosin Other (See Comments)   Aspirin Other (See Comments)    Upset stomach    Family History: Family History  Problem Relation Age of Onset   Heart disease Mother 22       After age 3 - CHF   Hypertension Father    Heart disease Father 19       After age 45 - CHF    Social History:  reports that he has never smoked. He has never used smokeless tobacco. He reports that he does not drink alcohol and does not use drugs.  ROS: All other review of systems were reviewed and are negative except what is noted above in HPI   Laboratory Data: Lab Results  Component Value Date   WBC 10.9 (H) 12/23/2020   HGB 14.3 12/23/2020   HCT 44.1 12/23/2020   MCV 89 12/23/2020   PLT 318 12/23/2020    Lab Results  Component Value Date   CREATININE 1.54 (H) 12/23/2020    No results found for: PSA  No results found for: TESTOSTERONE  Lab Results  Component Value Date   HGBA1C 5.8 (H)  12/23/2020    Urinalysis    Component Value Date/Time   APPEARANCEUR Clear 09/24/2020 1420   GLUCOSEU Negative 09/24/2020 1420   BILIRUBINUR Negative 09/24/2020 1420   PROTEINUR Negative 09/24/2020 1420   NITRITE Negative 09/24/2020 1420   LEUKOCYTESUR Negative 09/24/2020 1420    Lab Results  Component Value Date   LABMICR Comment 09/24/2020    Pertinent Imaging: Renal US today: Images reviewed and discussed with the patient No results found for this or any previous visit.  No results found for this or any previous  visit.  No results found for this or any previous visit.  No results found for this or any previous visit.  No results found for this or any previous visit.  No results found for this or any previous visit.  No results found for this or any previous visit.  No results found for this or any previous visit.   Assessment & Plan:   BPH with urinary retention -Continue bethenacol and finasteride   2. Hydronephrosis -RTC 6 months with renal US No follow-ups on file.  Wilkie Aye, MD  [facility] [place]

## 2021-03-31 NOTE — Patient Instructions (Signed)
Hydronephrosis ?Hydronephrosis is the swelling of one or both kidneys due to a blockage that stops urine from flowing out of the body. Kidneys filter waste from the blood and produce urine. This condition can lead to kidney failure and may become life-threatening if not treated promptly. ?What are the causes? ?In infants and children, common causes include problems that occur when a baby is developing in the womb. These can include problems in the kidneys or in the tubes that drain urine into the bladder (ureters). ?In adults, common causes include: ?Kidney stones. ?Pregnancy. ?A tumor or cyst in the abdomen or pelvis. ?An enlarged prostate gland. ?Other causes include: ?Bladder infection. ?Scar tissue from a previous surgery or injury. ?A blood clot. ?Cancer of the prostate, bladder, uterus, ovary, or colon. ?What are the signs or symptoms? ?Symptoms of this condition include: ?Pain or discomfort in your side (flank) or abdomen. ?Swelling in your abdomen. ?Nausea and vomiting. ?Fever. ?Pain when passing urine. ?Feelings of urgency when you need to urinate. ?Urinating more often than normal. ?In some cases, you may not have any symptoms. ?How is this diagnosed? ?This condition may be diagnosed based on: ?Your symptoms and medical history. ?A physical exam. ?Blood and urine tests. ?Imaging tests, such as an ultrasound, CT scan, or MRI. ?A procedure to look at your urinary tract and bladder by inserting a scope into the urethra (cystoscopy). ?How is this treated? ?Treatment for this condition depends on where the blockage is, how long it has been there, and what caused it. The goal of treatment is to remove the blockage. Treatment may include: ?Antibiotic medicines to treat or prevent infection. ?A procedure to place a small, thin tube (stent) into a blocked ureter. The stent will keep the ureter open so that urine can drain through it. ?A nonsurgical procedure that crushes kidney stones with shock waves  (extracorporeal shock wave lithotripsy). ?If kidney failure occurs, treatment may include dialysis or a kidney transplant. ?Follow these instructions at home: ? ?Take over-the-counter and prescription medicines only as told by your health care provider. ?If you were prescribed an antibiotic medicine, take it exactly as told by your health care provider. Do not stop taking the antibiotic even if you start to feel better. ?Rest and return to your normal activities as told by your health care provider. Ask your health care provider what activities are safe for you. ?Drink enough fluid to keep your urine pale yellow. ?Keep all follow-up visits. This is important. ?Contact a health care provider if: ?You continue to have symptoms after treatment. ?You develop new symptoms. ?Your urine becomes cloudy or bloody. ?You have a fever. ?Get help right away if: ?You have severe flank or abdominal pain. ?You cannot drink fluids without vomiting. ?Summary ?Hydronephrosis is the swelling of one or both kidneys due to a blockage that stops urine from flowing out of the body. ?Hydronephrosis can lead to kidney failure and may become life-threatening if not treated promptly. ?The goal of treatment is to remove the blockage. It may include a procedure to insert a stent into a blocked ureter, a procedure to break up kidney stones, or taking antibiotic medicines. ?Follow your health care provider's instructions for taking care of yourself at home, including instructions about drinking fluids, taking medicines, and limiting activities. ?This information is not intended to replace advice given to you by your health care provider. Make sure you discuss any questions you have with your health care provider. ?Document Revised: 01/22/2020 Document Reviewed: 01/22/2020 ?Elsevier Patient   Education ? 2022 [employer] ? ?

## 2021-04-02 ENCOUNTER — Telehealth: Payer: Self-pay

## 2021-04-02 NOTE — Progress Notes (Signed)
Chronic Care Management Pharmacy Assistant   Name: David Baxter  MRN: [id number] DOB: 07/30/1941   Reason for Encounter: Disease State for diabetes   Recent office visits:  03/27/21-Nephrology, stage 3 CKD, His last visit was on 09/23/2020. As usual, he is accompanied by his wife.  He reports falling off a ladder about 4 months ago. He has been having intermittent back pain since then.  He occasionally has chest pain. No SOB. No feet / leg swelling.  Appetite is fair. Admits not drinking enough water. He drinks lots of Tea.    12/23/20- Dr. Marina Goodell PCP, HTN, Labs, neurology referral for hand tremor,Cbc normal, glucose 108, kidney tests stable, liver tests normal, A1c 5.8 good, Cholesterol normal, PSA 0.2 normal    Recent consult visits:  03/31/21-[facility], renal ultrasound, Benign localized prostatic hyperplasia with lower urinary tract symptoms (LUTS), Renal US today shows no hydronephrosis and 640cc in his bladder. He denies any worsening LUTS. NO UTIs. He is currently on bethenacol and finasteride. Overall he is happy with his urination. No other complaints   Hospital visits:  None in previous 6 months  Medications: Outpatient Encounter Medications as of 04/02/2021  Medication Sig   ACCU-CHEK AVIVA PLUS test strip 1 each by Other route 2 (two) times daily. Use as instructed   Accu-Chek FastClix Lancets MISC 1 each by Does not apply route in the morning and at bedtime. Use to check blood sugar twice daily as directed.   allopurinol (ZYLOPRIM) 100 MG tablet TAKE 1 TABLET EVERY DAY   aspirin EC 81 MG tablet Take 81 mg by mouth daily.   bethanechol (URECHOLINE) 5 MG tablet Take 1 tablet (5 mg total) by mouth 2 (two) times daily.   Cholecalciferol (VITAMIN D-1000 MAX ST) 25 MCG (1000 UT) tablet Take 2,000 Units by mouth.    COLCRYS 0.6 MG tablet Prn gout flares   finasteride (PROSCAR) 5 MG tablet Take 1 tablet (5 mg total) by mouth daily.   levothyroxine (SYNTHROID) 100 MCG  tablet TAKE 1 TABLET BY MOUTH ONCE DAILY   LOKELMA 10 g PACK packet Take 10 g by mouth daily.    meclizine (ANTIVERT) 25 MG tablet Take by mouth. Takes OTC prn dizziness.   metFORMIN (GLUCOPHAGE) 500 MG tablet TAKE 1 TABLET TWICE DAILY   metoprolol tartrate (LOPRESSOR) 25 MG tablet Take 1 tablet (25 mg total) by mouth 2 (two) times daily.   nitroGLYCERIN (NITROSTAT) 0.4 MG SL tablet Comments:   Patient Notes: Place 1 tablet under tongue as directed USE EVERY 5 MINUTES UP TO THREE DOSES  Duration: 30   simvastatin (ZOCOR) 40 MG tablet TAKE 1/2 TABLET EVERY DAY   sodium bicarbonate 650 MG tablet Take by mouth 2 (two) times daily.   vitamin B-12 (CYANOCOBALAMIN) 1000 MCG tablet Take 1,000 mcg by mouth daily.   No facility-administered encounter medications on file as of 04/02/2021.   Recent Relevant Labs: Lab Results  Component Value Date/Time   HGBA1C 5.8 (H) 12/23/2020 09:02 AM   HGBA1C 5.9 (H) 08/19/2020 09:32 AM   MICROALBUR 80 12/23/2020 09:03 AM    Kidney Function Lab Results  Component Value Date/Time   CREATININE 1.54 (H) 12/23/2020 09:02 AM   CREATININE 1.49 (H) 08/19/2020 09:32 AM   GFRNONAA 43 (L) 08/19/2020 09:32 AM   GFRAA 50 (L) 08/19/2020 09:32 AM     Current antihyperglycemic regimen:  Metformin   Adherence Review: Is the patient currently on a STATIN medication? Yes Is the patient currently  on ACE/ARB medication? Yes Does the patient have >5 day gap between last estimated fill dates? CPP to review  Care Gaps: Last eye exam / Retinopathy Screening? 12/21 Last Annual Wellness Visit? 05/08/20 Last Diabetic Foot Exam? 01/02/21   Star Rating Drugs:  confirmed with [employer] Medication:  Last Fill: Day Supply Metoprolol  04/03/21 90 Simvastatin  01/13/21 90 needs new script sent Metformin  01/13/21 90  needs new script sent Allopurinol  04/03/21 90  needs new script sent     Leilani Able, [facility] Clinical Pharmacist Assistant [phone]

## 2021-04-29 ENCOUNTER — Ambulatory Visit: Payer: Medicare PPO | Admitting: Legal Medicine

## 2021-04-29 ENCOUNTER — Encounter: Payer: Self-pay | Admitting: Legal Medicine

## 2021-04-29 ENCOUNTER — Other Ambulatory Visit: Payer: Self-pay

## 2021-04-29 VITALS — BP 112/80 | HR 76 | Temp 97.3°F | Ht 71.0 in | Wt 160.2 lb

## 2021-04-29 DIAGNOSIS — E211 Secondary hyperparathyroidism, not elsewhere classified: Secondary | ICD-10-CM | POA: Diagnosis not present

## 2021-04-29 DIAGNOSIS — N401 Enlarged prostate with lower urinary tract symptoms: Secondary | ICD-10-CM

## 2021-04-29 DIAGNOSIS — E1169 Type 2 diabetes mellitus with other specified complication: Secondary | ICD-10-CM | POA: Diagnosis not present

## 2021-04-29 DIAGNOSIS — E039 Hypothyroidism, unspecified: Secondary | ICD-10-CM

## 2021-04-29 DIAGNOSIS — N1832 Chronic kidney disease, stage 3b: Secondary | ICD-10-CM

## 2021-04-29 DIAGNOSIS — R350 Frequency of micturition: Secondary | ICD-10-CM

## 2021-04-29 DIAGNOSIS — Z8739 Personal history of other diseases of the musculoskeletal system and connective tissue: Secondary | ICD-10-CM

## 2021-04-29 DIAGNOSIS — I10 Essential (primary) hypertension: Secondary | ICD-10-CM

## 2021-04-29 DIAGNOSIS — E1121 Type 2 diabetes mellitus with diabetic nephropathy: Secondary | ICD-10-CM

## 2021-04-29 DIAGNOSIS — I25708 Atherosclerosis of coronary artery bypass graft(s), unspecified, with other forms of angina pectoris: Secondary | ICD-10-CM | POA: Diagnosis not present

## 2021-04-29 DIAGNOSIS — E441 Mild protein-calorie malnutrition: Secondary | ICD-10-CM | POA: Insufficient documentation

## 2021-04-29 DIAGNOSIS — E782 Mixed hyperlipidemia: Secondary | ICD-10-CM | POA: Diagnosis not present

## 2021-04-29 HISTORY — DX: Mild protein-calorie malnutrition: E44.1

## 2021-04-29 NOTE — Progress Notes (Signed)
Established Patient Office Visit  Subjective:  Patient ID: David Baxter, male    DOB: January 10, 1941  Age: 80 y.o. MRN: [id number]  CC:  Chief Complaint  Patient presents with   Hypertension    HPI David Baxter presents for chronic visit  Patient presents for follow up of hypertension.  Patient tolerating metoprolol well with side effects.  Patient was diagnosed with hypertension 2010 so has been treated for hypertension for 10 years.Patient is working on maintaining diet and exercise regimen and follows up as directed. Complication include none  CORONARY ARTERY DISEASE  Patient presents in follow up of CAD. Patient was diagnosed in 2016. The patient has no associated CHF. The patient is currently taking a beta blocker, statin, and aspirin. CAD was diagnosed 6 years ago.  Patient is having n0 angina. Patient has used no NTG.  Patient is followed by cardiology.  Patient had stents x 2 . Last angiography was 2016, last echocardiogram 2022. 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 DES OM 2012    2. 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[place]) 12/05/2019   SIRS (systemic inflammatory response syndrome) ([place]) 04/08/2018   Type 2 diabetes mellitus ([place]) 04/08/2018   UTI (urinary tract infection) 04/08/2018    Past Surgical History:  Procedure Laterality Date   CORONARY ANGIOPLASTY WITH STENT PLACEMENT  03/04/2015   x2 DES to CFX   CORONARY ANGIOPLASTY WITH STENT PLACEMENT  2012   x1 DES to OM   CYSTOSCOPY WITH INSERTION OF UROLIFT N/A 02/14/2017   Procedure: CYSTOSCOPY WITH INSERTION OF UROLIFT;  Surgeon: Cleon Gustin, MD;  Location: [facility];  Service: Urology;  Laterality: N/A;   EXTRACORPOREAL SHOCK WAVE LITHOTRIPSY  56's   PERCUTANEOUS NEPHROSTOLITHOTOMY  1991   and abdominal hernia repair   URETEROLITHOTOMY  1991 and 1994    Family History  Problem Relation Age of Onset   Heart disease Mother 56       After age 14 - CHF   Hypertension Father    Heart disease Father 22       After age 33 - CHF    Social History   Socioeconomic History   Marital status: Married  Spouse name: Not on file   Number of children: Not on file   Years of education: Not on file   Highest education level: Not on file  Occupational History   Occupation: truck driver  Tobacco Use   Smoking status: Never   Smokeless tobacco: Never  Substance and Sexual Activity   Alcohol use: No   Drug use: No   Sexual activity: Yes    Partners: Female  Other Topics Concern   Not on file  Social History Narrative   Not on file   Social Determinants of Health   Financial Resource Strain: Not on file  Food Insecurity: No Food Insecurity   Worried About Running Out of Food in the Last Year: Never true   Ran Out of Food in the Last Year: Never true   Transportation Needs: Not on file  Physical Activity: Not on file  Stress: Not on file  Social Connections: Not on file  Intimate Partner Violence: Not on file    Outpatient Medications Prior to Visit  Medication Sig Dispense Refill   ACCU-CHEK AVIVA PLUS test strip 1 each by Other route 2 (two) times daily. Use as instructed 100 each 2   Accu-Chek FastClix Lancets MISC 1 each by Does not apply route in the morning and at bedtime. Use to check blood sugar twice daily as directed. 100 each 6   allopurinol (ZYLOPRIM) 100 MG tablet TAKE 1 TABLET EVERY DAY 90 tablet 2   aspirin EC 81 MG tablet Take 81 mg by mouth daily.     bethanechol (URECHOLINE) 5 MG tablet Take 1 tablet (5 mg total) by mouth 2 (two) times daily. 180 tablet 3   Cholecalciferol (VITAMIN D-1000 MAX ST) 25 MCG (1000 UT) tablet Take 2,000 Units by mouth.      COLCRYS 0.6 MG tablet Prn gout flares     finasteride (PROSCAR) 5 MG tablet Take 1 tablet (5 mg total) by mouth daily. 90 tablet 3   levothyroxine (SYNTHROID) 100 MCG tablet TAKE 1 TABLET BY MOUTH ONCE DAILY 90 tablet 2   LOKELMA 10 g PACK packet Take 10 g by mouth daily.      meclizine (ANTIVERT) 25 MG tablet Take by mouth. Takes OTC prn dizziness.     metFORMIN (GLUCOPHAGE) 500 MG tablet TAKE 1 TABLET TWICE DAILY 180 tablet 2   metoprolol tartrate (LOPRESSOR) 25 MG tablet Take 1 tablet (25 mg total) by mouth 2 (two) times daily. 180 tablet 2   nitroGLYCERIN (NITROSTAT) 0.4 MG SL tablet Comments:   Patient Notes: Place 1 tablet under tongue as directed USE EVERY 5 MINUTES UP TO THREE DOSES  Duration: 30     simvastatin (ZOCOR) 40 MG tablet TAKE 1/2 TABLET EVERY DAY 45 tablet 2   sodium bicarbonate 650 MG tablet Take by mouth 2 (two) times daily.     vitamin B-12 (CYANOCOBALAMIN) 1000 MCG tablet Take 1,000 mcg by mouth daily.     No facility-administered medications prior to visit.    Allergies  Allergen Reactions   Tamsulosin Other (See Comments)   Aspirin Other  (See Comments)    Upset stomach    ROS Review of Systems  Constitutional:  Positive for appetite change (not eating, we discussed supplements). Negative for chills, fatigue and fever.  HENT:  Negative for congestion, ear pain and sore throat.   Respiratory:  Negative for cough and shortness of breath.   Cardiovascular:  Negative for chest pain.  Gastrointestinal:  Negative for abdominal pain,  constipation, diarrhea, nausea and vomiting.  Endocrine: Negative for polydipsia, polyphagia and polyuria.  Genitourinary:  Negative for dysuria and frequency.  Musculoskeletal:  Negative for arthralgias and myalgias.  Neurological:  Negative for dizziness and headaches.  Psychiatric/Behavioral:  Negative for dysphoric mood.        No dysphoria     Objective:    Physical Exam Vitals reviewed.  Constitutional:      General: He is not in acute distress.    Appearance: Normal appearance.  HENT:     Head: Normocephalic.     Right Ear: Tympanic membrane, ear canal and external ear normal.     Left Ear: Tympanic membrane, ear canal and external ear normal.     Mouth/Throat:     Mouth: Mucous membranes are moist.     Pharynx: Oropharynx is clear.  Eyes:     Extraocular Movements: Extraocular movements intact.     Conjunctiva/sclera: Conjunctivae normal.     Pupils: Pupils are equal, round, and reactive to light.  Cardiovascular:     Rate and Rhythm: Normal rate and regular rhythm.     Pulses: Normal pulses.     Heart sounds: Normal heart sounds.  Pulmonary:     Effort: Pulmonary effort is normal. No respiratory distress.     Breath sounds: No wheezing.  Abdominal:     General: Abdomen is flat. Bowel sounds are normal. There is no distension.     Palpations: Abdomen is soft.     Tenderness: There is no abdominal tenderness.  Musculoskeletal:     Cervical back: Normal range of motion and neck supple.  Skin:    General: Skin is warm.     Capillary Refill: Capillary refill takes less  than 2 seconds.  Neurological:     General: No focal deficit present.     Mental Status: He is alert and oriented to person, place, and time. Mental status is at baseline.    BP 112/80 (BP Location: Left Leg, Patient Position: Sitting, Cuff Size: Normal)   Pulse 76   Temp (!) 97.3 F (36.3 C) (Temporal)   Ht '5\' 11"'  (1.803 m)   Wt 160 lb 3.2 oz (72.7 kg)   SpO2 95%   BMI 22.34 kg/m  Wt Readings from Last 3 Encounters:  04/29/21 160 lb 3.2 oz (72.7 kg)  02/25/21 166 lb (75.3 kg)  12/23/20 167 lb (75.8 kg)     Health Maintenance Due  Topic Date Due   Zoster Vaccines- Shingrix (1 of 2) Never done   COVID-19 Vaccine (4 - Booster for Moderna series) 12/17/2020    There are no preventive care reminders to display for this patient.  Lab Results  Component Value Date   TSH 2.690 08/19/2020   Lab Results  Component Value Date   WBC 10.9 (H) 12/23/2020   HGB 14.3 12/23/2020   HCT 44.1 12/23/2020   MCV 89 12/23/2020   PLT 318 12/23/2020   Lab Results  Component Value Date   NA 144 12/23/2020   K 5.2 12/23/2020   CO2 25 12/23/2020   GLUCOSE 108 (H) 12/23/2020   BUN 27 12/23/2020   CREATININE 1.54 (H) 12/23/2020   BILITOT 0.4 12/23/2020   ALKPHOS 80 12/23/2020   AST 13 12/23/2020   ALT 8 12/23/2020   PROT 6.8 12/23/2020   ALBUMIN 4.4 12/23/2020   CALCIUM 9.3 12/23/2020   EGFR 45 (L) 12/23/2020   Lab Results  Component Value Date   CHOL 127 12/23/2020  Lab Results  Component Value Date   HDL 45 12/23/2020   Lab Results  Component Value Date   LDLCALC 65 12/23/2020   Lab Results  Component Value Date   TRIG 88 12/23/2020   Lab Results  Component Value Date   CHOLHDL 2.8 12/23/2020   Lab Results  Component Value Date   HGBA1C 5.8 (H) 12/23/2020      Assessment & Plan:   Problem List Items Addressed This Visit       Cardiovascular and Mediastinum   Coronary artery disease of bypass graft of native heart with stable angina pectoris ([place]) -  Primary An individual plan was formulated based on patient history and exam, labs and evidence based data. Patient has not had recent angina or nitroglycerin use. continue present treatment.    Essential hypertension   Relevant Orders   CBC with Differential/Platelet   Comprehensive metabolic panel An individual hypertension care plan was established and reinforced today.  The patient's status was assessed using clinical findings on exam and labs or diagnostic tests. The patient's success at meeting treatment goals on disease specific evidence-based guidelines and found to be well controlled. SELF MANAGEMENT: The patient and I together assessed ways to personally work towards obtaining the recommended goals. RECOMMENDATIONS: avoid decongestants found in common cold remedies, decrease consumption of alcohol, perform routine monitoring of BP with home BP cuff, exercise, reduction of dietary salt, take medicines as prescribed, try not to miss doses and quit smoking.  Regular exercise and maintaining a healthy weight is needed.  Stress reduction may help. A CLINICAL SUMMARY including written plan identify barriers to care unique to individual due to social or financial issues.  We attempt to mutually creat solutions for individual and family understanding.      Endocrine   Acquired hypothyroidism Patient is known to have hypothyroidism  and is on treatment with levothyroxine 111mg.  Patient was diagnosed non years ago.  Other treatment includes none.  Patient is compliant with medicines and last TSH 6 months ago.  Last TSH was normal .    Secondary hyperparathyroidism, non-renal ([facility] Patient has hyperparathyroidism but BP controlled    Diabetic glomerulopathy ([facility] An individual care plan for diabetes was established and reinforced today.  The patient's status was assessed using clinical findings on exam, labs and diagnostic testing. Patient success at meeting goals based on disease specific  evidence-based guidelines and found to be good controlled. Medications were assessed and patient's understanding of the medical issues , including barriers were assessed. Recommend adherence to a diabetic diet, a graduated exercise program, HgbA1c level is checked quarterly, and urine microalbumin performed yearly .  Annual mono-filament sensation testing performed. Lower blood pressure and control hyperlipidemia is important. Get annual eye exams and annual flu shots and smoking cessation discussed.  Self management goals were discussed.    Type 2 diabetes mellitus with other specified complication (HCC)   Relevant Orders   Hemoglobin A1c An individual care plan for diabetes was established and reinforced today.  The patient's status was assessed using clinical findings on exam, labs and diagnostic testing. Patient success at meeting goals based on disease specific evidence-based guidelines and found to be good controlled. Medications were assessed and patient's understanding of the medical issues , including barriers were assessed. Recommend adherence to a diabetic diet, a graduated exercise program, HgbA1c level is checked quarterly, and urine microalbumin performed yearly .  Annual mono-filament sensation testing performed. Lower blood pressure and control hyperlipidemia is important. Get annual  eye exams and annual flu shots and smoking cessation discussed.  Self management goals were discussed.      Genitourinary   Benign prostatic hyperplasia Patient has BPH and is under control    Chronic kidney disease (CKD) stage G3b/A1, moderately decreased glomerular filtration rate (GFR) between 30-44 mL/min/1.73 square meter and albuminuria creatinine ratio less than 30 mg/g (HCC) AN INDIVIDUAL CARE PLAN for chronic renal disease was established and reinforced today.  The patient's status was assessed using clinical findings on exam, labs, and other diagnostic testing. Patient's success at meeting  treatment goals based on disease specific evidence-bassed guidelines and found to be in fair control. RECOMMENDATIONS include maintaining present medicines and treatment.      Other   Hyperlipidemia   Relevant Orders   Lipid panel AN INDIVIDUAL CARE PLAN for hyperlipidemia/ cholesterol was established and reinforced today.  The patient's status was assessed using clinical findings on exam, lab and other diagnostic tests. The patient's disease status was assessed based on evidence-based guidelines and found to be fair controlled. MEDICATIONS were reviewed . SELF MANAGEMENT GOALS have been discussed and patient's success at attaining the goal of low cholesterol was assessed. RECOMMENDATION given include regular exercise 3 days a week and low cholesterol/low fat diet. CLINICAL SUMMARY including written plan to identify barriers unique to the patient due to social or economic  reasons was discussed.    History of gout   Relevant Orders   Uric acid AN INDIVIDUAL CARE PLAN for gout was established and reinforced today.  The patient's status was assessed using clinical findings on exam, labs, and other diagnostic testing. Patient's success at meeting treatment goals based on disease specific evidence-bassed guidelines and found to be in good control. RECOMMENDATIONS include maintaining present medicines and treatment.     Malnutrition of mild degree (HCC) Supplement nutrition with protein/calorie supplement with meals to improve nutritional status. We discussed options to increase protein and calorie intake and not affect potassium    30 minutes plus review of records   Follow-up: Return in about 4 months (around 08/30/2021) for fasting.    [name], MD

## 2021-04-30 ENCOUNTER — Other Ambulatory Visit: Payer: Self-pay

## 2021-04-30 DIAGNOSIS — E875 Hyperkalemia: Secondary | ICD-10-CM

## 2021-04-30 LAB — COMPREHENSIVE METABOLIC PANEL
ALT: 14 IU/L (ref 0–44)
AST: 17 IU/L (ref 0–40)
Albumin/Globulin Ratio: 2 (ref 1.2–2.2)
Albumin: 4.2 g/dL (ref 3.6–4.6)
Alkaline Phosphatase: 93 IU/L (ref 44–121)
BUN/Creatinine Ratio: 12 (ref 10–24)
BUN: 20 mg/dL (ref 8–27)
Bilirubin Total: 0.5 mg/dL (ref 0.0–1.2)
CO2: 25 mmol/L (ref 20–29)
Calcium: 9.2 mg/dL (ref 8.6–10.2)
Chloride: 103 mmol/L (ref 96–106)
Creatinine, Ser: 1.67 mg/dL — ABNORMAL HIGH (ref 0.76–1.27)
Globulin, Total: 2.1 g/dL (ref 1.5–4.5)
Glucose: 99 mg/dL (ref 65–99)
Potassium: 5.5 mmol/L — ABNORMAL HIGH (ref 3.5–5.2)
Sodium: 142 mmol/L (ref 134–144)
Total Protein: 6.3 g/dL (ref 6.0–8.5)
eGFR: 41 mL/min/{1.73_m2} — ABNORMAL LOW (ref >59)

## 2021-04-30 LAB — CBC WITH DIFFERENTIAL/PLATELET
Basophils Absolute: 0.1 10*3/uL (ref 0.0–0.2)
Basos: 1 %
EOS (ABSOLUTE): 0.4 10*3/uL (ref 0.0–0.4)
Eos: 4 %
Hematocrit: 44.7 % (ref 37.5–51.0)
Hemoglobin: 14.4 g/dL (ref 13.0–17.7)
Immature Grans (Abs): 0 10*3/uL (ref 0.0–0.1)
Immature Granulocytes: 0 %
Lymphocytes Absolute: 2.9 10*3/uL (ref 0.7–3.1)
Lymphs: 32 %
MCH: 28.7 pg (ref 26.6–33.0)
MCHC: 32.2 g/dL (ref 31.5–35.7)
MCV: 89 fL (ref 79–97)
Monocytes Absolute: 0.9 10*3/uL (ref 0.1–0.9)
Monocytes: 10 %
Neutrophils Absolute: 4.7 10*3/uL (ref 1.4–7.0)
Neutrophils: 53 %
Platelets: 333 10*3/uL (ref 150–450)
RBC: 5.02 x10E6/uL (ref 4.14–5.80)
RDW: 13.8 % (ref 11.6–15.4)
WBC: 9 10*3/uL (ref 3.4–10.8)

## 2021-04-30 LAB — URIC ACID: Uric Acid: 5.9 mg/dL (ref 3.8–8.4)

## 2021-04-30 LAB — CARDIOVASCULAR RISK ASSESSMENT

## 2021-04-30 LAB — LIPID PANEL
Chol/HDL Ratio: 3.2 ratio (ref 0.0–5.0)
Cholesterol, Total: 119 mg/dL (ref 100–199)
HDL: 37 mg/dL — ABNORMAL LOW (ref >39)
LDL Chol Calc (NIH): 61 mg/dL (ref 0–99)
Triglycerides: 112 mg/dL (ref 0–149)
VLDL Cholesterol Cal: 21 mg/dL (ref 5–40)

## 2021-04-30 LAB — HEMOGLOBIN A1C
Est. average glucose Bld gHb Est-mCnc: 123 mg/dL
Hgb A1c MFr Bld: 5.9 % — ABNORMAL HIGH (ref 4.8–5.6)

## 2021-05-05 ENCOUNTER — Other Ambulatory Visit: Payer: Self-pay | Admitting: Legal Medicine

## 2021-05-07 ENCOUNTER — Other Ambulatory Visit: Payer: Medicare PPO

## 2021-05-07 DIAGNOSIS — E875 Hyperkalemia: Secondary | ICD-10-CM

## 2021-05-08 LAB — COMPREHENSIVE METABOLIC PANEL
ALT: 10 IU/L (ref 0–44)
AST: 14 IU/L (ref 0–40)
Albumin/Globulin Ratio: 1.9 (ref 1.2–2.2)
Albumin: 4.2 g/dL (ref 3.6–4.6)
Alkaline Phosphatase: 92 IU/L (ref 44–121)
BUN/Creatinine Ratio: 15 (ref 10–24)
BUN: 27 mg/dL (ref 8–27)
Bilirubin Total: 0.4 mg/dL (ref 0.0–1.2)
CO2: 27 mmol/L (ref 20–29)
Calcium: 9.1 mg/dL (ref 8.6–10.2)
Chloride: 107 mmol/L — ABNORMAL HIGH (ref 96–106)
Creatinine, Ser: 1.75 mg/dL — ABNORMAL HIGH (ref 0.76–1.27)
Globulin, Total: 2.2 g/dL (ref 1.5–4.5)
Glucose: 98 mg/dL (ref 65–99)
Potassium: 5.6 mmol/L — ABNORMAL HIGH (ref 3.5–5.2)
Sodium: 145 mmol/L — ABNORMAL HIGH (ref 134–144)
Total Protein: 6.4 g/dL (ref 6.0–8.5)
eGFR: 39 mL/min/{1.73_m2} — ABNORMAL LOW (ref >59)

## 2021-05-08 NOTE — Progress Notes (Signed)
Kidney tests stage 3b, stable, potassium high continue lokelma, liver tests normal,  lp

## 2021-05-26 NOTE — Progress Notes (Signed)
Chronic Care Management Pharmacy Note  05/27/2021 Name:  David Baxter MRN:  [id number] DOB:  [birth year]  Plan Updates:  Reports recent blood sugar readings at home are 90-115 mg/dL:.  Weight loss continues - 155 lbs at home currently. Watching diet for potassium intake. Patient does not want to drink shakes but is trying to eat more protein to avoid additional weight loss.  Recommend updated TSH level checked with next labs.   Subjective: David Baxter is an 80 y.o. year old male who is a primary patient of Henrene Pastor, Zeb Comfort, MD.  The CCM team was consulted for assistance with disease management and care coordination needs.    Engaged with patient by telephone for follow up visit in response to provider referral for pharmacy case management and/or care coordination services.   Consent to Services:  The patient was given information about Chronic Care Management services, agreed to services, and gave verbal consent prior to initiation of services.  Please see initial visit note for detailed documentation.   Patient Care Team: Lillard Anes, MD as PCP - General (Family Medicine) Burnice Logan, [facility] as Pharmacist (Pharmacist)  Recent office visits:  04/29/2021 - discussed protein options without risk of increasing potassium.He occasionally has chest pain. No SOB. No feet / leg swelling. Appetite is fair. Admits not drinking enough water. He drinks lots of Tea.   12/23/20- Dr. Henrene Pastor PCP, HTN, Labs, neurology referral for hand tremor,Cbc normal, glucose 108, kidney tests stable, liver tests normal, A1c 5.8 good, Cholesterol normal, PSA 0.2 normal     Recent consult visits:  03/31/21-[facility], renal ultrasound, Benign localized prostatic hyperplasia with lower urinary tract symptoms (LUTS), Renal US today shows no hydronephrosis and 640cc in his bladder. He denies any worsening LUTS. NO UTIs. He is currently on bethenacol and finasteride. Overall he is happy with his  urination. No other complaints 03/27/21-Nephrology, stage 3 CKD, His last visit was on 09/23/2020.As   usual, he is accompanied by his wife. He reports falling off a ladder about 4 months ago. He has been having intermittent back pain since then.      Hospital visits:  None in previous 6 months  Objective:  Lab Results  Component Value Date   CREATININE 1.75 (H) 05/07/2021   BUN 27 05/07/2021   GFRNONAA 43 (L) 08/19/2020   GFRAA 50 (L) 08/19/2020   NA 145 (H) 05/07/2021   K 5.6 (H) 05/07/2021   CALCIUM 9.1 05/07/2021   CO2 27 05/07/2021    Lab Results  Component Value Date/Time   HGBA1C 5.9 (H) 04/29/2021 08:54 AM   HGBA1C 5.8 (H) 12/23/2020 09:02 AM   MICROALBUR 80 12/23/2020 09:03 AM    Last diabetic Eye exam:  Lab Results  Component Value Date/Time   HMDIABEYEEXA No Retinopathy 09/29/2020 12:00 AM    Last diabetic Foot exam: No results found for: HMDIABFOOTEX   Lab Results  Component Value Date   CHOL 119 04/29/2021   HDL 37 (L) 04/29/2021   LDLCALC 61 04/29/2021   TRIG 112 04/29/2021   CHOLHDL 3.2 04/29/2021    Hepatic Function Latest Ref Rng & Units 05/07/2021 04/29/2021 12/23/2020  Total Protein 6.0 - 8.5 g/dL 6.4 6.3 6.8  Albumin 3.6 - 4.6 g/dL 4.2 4.2 4.4  AST 0 - 40 IU/L '14 17 13  ' ALT 0 - 44 IU/L '10 14 8  ' Alk Phosphatase 44 - 121 IU/L 92 93 80  Total Bilirubin 0.0 - 1.2 mg/dL 0.4 0.5 0.4  Lab Results  Component Value Date/Time   TSH 2.690 08/19/2020 09:32 AM   TSH 2.160 04/18/2020 08:26 AM    CBC Latest Ref Rng & Units 04/29/2021 12/23/2020 08/19/2020  WBC 3.4 - 10.8 x10E3/uL 9.0 10.9(H) 9.2  Hemoglobin 13.0 - 17.7 g/dL 14.4 14.3 14.1  Hematocrit 37.5 - 51.0 % 44.7 44.1 42.5  Platelets 150 - 450 x10E3/uL 333 318 331    No results found for: VD25OH  Clinical ASCVD: Yes  The ASCVD Risk score Mikey Bussing DC Jr., et al., 2013) failed to calculate for the following reasons:   The 2013 ASCVD risk score is only valid for ages 82 to 38    Depression screen  PHQ 2/9 05/11/2020 04/18/2020  Decreased Interest 0 0  Down, Depressed, Hopeless 0 0  PHQ - 2 Score 0 0     Social History   Tobacco Use  Smoking Status Never  Smokeless Tobacco Never   BP Readings from Last 3 Encounters:  04/29/21 112/80  02/25/21 (!) 158/86  12/23/20 130/70   Pulse Readings from Last 3 Encounters:  04/29/21 76  02/25/21 65  12/23/20 70   Wt Readings from Last 3 Encounters:  04/29/21 160 lb 3.2 oz (72.7 kg)  02/25/21 166 lb (75.3 kg)  12/23/20 167 lb (75.8 kg)    Assessment/Interventions: Review of patient past medical history, allergies, medications, health status, including review of consultants reports, laboratory and other test data, was performed as part of comprehensive evaluation and provision of chronic care management services.   SDOH:  (Social Determinants of Health) assessments and interventions performed: Yes   CCM Care Plan  Allergies  Allergen Reactions   Tamsulosin Other (See Comments)   Aspirin Other (See Comments)    Upset stomach    Medications Reviewed Today     Reviewed by Burna Forts, CMA (Certified Medical Assistant) on 04/29/21 at 0816  Med List Status: <None>   Medication Order Taking? Sig Documenting Provider Last Dose Status Informant  ACCU-CHEK AVIVA PLUS test strip [id number] Yes 1 each by Other route 2 (two) times daily. Use as instructed Lillard Anes, MD Taking Active   Accu-Chek FastClix Lancets [place] [id number] Yes 1 each by Does not apply route in the morning and at bedtime. Use to check blood sugar twice daily as directed. Lillard Anes, MD Taking Active   allopurinol [facility]) 100 MG tablet [id number] Yes TAKE 1 TABLET EVERY DAY Lillard Anes, MD Taking Active   aspirin EC 81 MG tablet [id number] Yes Take 81 mg by mouth daily. [provider] Taking Active   bethanechol (URECHOLINE) 5 MG tablet [id number] Yes Take 1 tablet (5 mg total) by mouth 2 (two) times daily. McKenzie,  Candee Furbish, MD Taking Active   Cholecalciferol (VITAMIN D-1000 MAX ST) 25 MCG (1000 UT) tablet [id number] Yes Take 2,000 Units by mouth.  [provider] Taking Active   COLCRYS 0.6 MG tablet [id number] Yes Prn gout flares [provider] Taking Active Self  finasteride (PROSCAR) 5 MG tablet [id number] Yes Take 1 tablet (5 mg total) by mouth daily. Cleon Gustin, MD Taking Active   levothyroxine (SYNTHROID) 100 MCG tablet [id number] Yes TAKE 1 TABLET BY MOUTH ONCE DAILY Lillard Anes, MD Taking Active   LOKELMA 10 g PACK packet [id number] Yes Take 10 g by mouth daily.  [provider] Taking Active Self  meclizine (ANTIVERT) 25 MG tablet [id number] Yes Take by mouth. Takes OTC prn dizziness. [provider] Taking Active Self  metFORMIN (GLUCOPHAGE) 500 MG tablet [id number] Yes TAKE 1 TABLET TWICE DAILY Lillard Anes, MD Taking Active   metoprolol tartrate (LOPRESSOR) 25 MG tablet [id number] Yes Take 1 tablet (25 mg total) by mouth 2 (two) times daily. Lillard Anes, MD Taking Active   nitroGLYCERIN (NITROSTAT) 0.4 MG SL tablet [id number] Yes Comments:   Patient Notes: Place 1 tablet under tongue as directed USE EVERY 5 MINUTES UP TO THREE DOSES  Duration: 30 [provider] Taking Active   simvastatin (ZOCOR) 40 MG tablet [id number] Yes TAKE 1/2 TABLET EVERY DAY Lillard Anes, MD Taking Active   sodium bicarbonate 650 MG tablet [id number] Yes Take by mouth 2 (two) times daily. [provider] Taking Active   vitamin B-12 (CYANOCOBALAMIN) 1000 MCG tablet [id number] Yes Take 1,000 mcg by mouth daily. [provider] Taking Active             Patient Active Problem List   Diagnosis Date Noted   Malnutrition of mild degree ([place]) 04/29/2021   Hydronephrosis 03/31/2021   Other specified personal risk factors, not elsewhere classified 11/17/2020   Type 2 diabetes mellitus with other specified complication  ([place]) 48/88/9169   Personal history of urinary calculi 09/23/2020   History of cerebrovascular accident 09/23/2020   Pain in unspecified joint 09/23/2020   Presence of coronary angioplasty implant and graft 09/23/2020   S/P drug eluting coronary stent placement    Hyperlipidemia    HOH (hard of hearing)    History of gout    Chronic kidney disease (CKD) stage G3b/A1, moderately decreased glomerular filtration rate (GFR) between 30-44 mL/min/1.73 square meter and albuminuria creatinine ratio less than 30 mg/g (HCC)    CAD (coronary artery disease)    Arthralgia of both lower legs    Acute sinusitis 06/05/2020   BMI 22.0-22.9, adult 04/21/2020   Secondary hyperparathyroidism, 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/05/2019   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   Benign prostatic hyperplasia 12/05/2019   Retention, urine 04/09/2018   Acquired hypothyroidism 04/08/2018   SIRS (systemic inflammatory response syndrome) ([place]) 04/08/2018   Acute renal failure syndrome ([place]) 04/08/2018   Coronary artery disease of bypass graft of native heart with stable angina pectoris ([place]) 04/09/2015   Essential hypertension 04/09/2015    Immunization History  Administered Date(s) Administered   Fluad Quad(high Dose 65+) 08/19/2020   Moderna SARS-COV2 Booster Vaccination 08/19/2020   [place] Sars-Covid-2 Vaccination 11/22/2019, 12/18/2019    Conditions to be addressed/monitored:  Hypertension, Hyperlipidemia, Diabetes, Chronic Kidney Disease and Hypothyroidism  Care Plan : CCM Pharmacy Care Plan  Updates made by Burnice Logan, [place] since 05/27/2021 12:00 AM     Problem: dm, htn, hld, cad   Priority: High  Onset Date: 12/12/2020     Long-Range Goal: Disease Management   Start Date: 12/12/2020  Expected End Date: 12/12/2021  Recent Progress: On track  Priority: High  Note:   Current Barriers:  Unable to independently afford treatment regimen  Pharmacist Clinical Goal(s):  Over the next 90 days, patient will  verbalize ability to afford treatment regimen maintain control of diabetes as evidenced by a1c  through collaboration with PharmD and provider.   Interventions: 1:1 collaboration with Lillard Anes, MD regarding development and update of comprehensive plan of care as evidenced by provider attestation and co-signature Inter-disciplinary care team collaboration (see longitudinal plan of care) Comprehensive medication review performed; medication list updated in electronic medical record  Hypertension (BP goal <130/80) -Controlled -Current treatment: Metoprolol tartrate 25 mg bid  -  Medications previously tried: none noted -Current home readings: 120-130/70s -Current dietary habits: low potassium diet. Trying to increase intake to avoid additional weight loss.  -Current exercise habits: stays active around his house and walking daughter's dog -Denies hypotensive/hypertensive symptoms -Educated on BP goals and benefits of medications for prevention of heart attack, stroke and kidney damage; Daily salt intake goal < 2300 mg; Exercise goal of 150 minutes per week; Importance of home blood pressure monitoring; -Counseled to monitor BP at home weekly, document, and provide log at future appointments -Counseled on diet and exercise extensively Recommended to continue current medication  Hyperlipidemia: (LDL goal < 55) -Not ideally controlled -Current treatment: Simvastatin 40 mg - 1/2 tablet daily  -Medications previously tried: none reported  -Current dietary patterns: low potassium diet.  -Current exercise habits: travels frequently to visit family. Works around his home and walks with dog..  -Educated on Cholesterol goals;  Benefits of statin for ASCVD risk reduction; Importance of limiting foods high in cholesterol; Exercise goal of 150 minutes per week; -Counseled on diet and exercise extensively Recommended to continue current medication  Diabetes (A1c goal  <7%) -Controlled -Current medications: Metformin 500 mg bid (GFR range recently 39-43 so dosing appropriate - continue to monitor) -Medications previously tried: none reported  -Current home glucose readings fasting glucose: 90-115 mg/dL post prandial glucose: none reported -Denies hypoglycemic/hyperglycemic symptoms -Current meal patterns:  Eats a variety of fruits and vegetables. Limits potassium in diet due to kidney. Trying to increase protein.  -Current exercise: stays active around home.  -Educated onA1c and blood sugar goals; Complications of diabetes including kidney damage, retinal damage, and cardiovascular disease; Exercise goal of 150 minutes per week; Benefits of routine self-monitoring of blood sugar; -Counseled to check feet daily and get yearly eye exams -Counseled on diet and exercise extensively Recommended to continue current medication  Hypothyroidism (Goal: manage TSH) -Controlled -Current treatment  Levothyroxine 100 mcg daily -Medications previously tried: none reported  -Recommended to continue current medication  Health Maintenance -Vaccine gaps: recommended updating tetanus shot  -Counseled on benefits of Tetanus and shingles shot.    Patient Goals/Self-Care Activities Over the next 90 days, patient will:  - take medications as prescribed focus on medication adherence by using pill box check glucose daily , document, and provide at future appointments check blood pressure weekly, document, and provide at future appointments  Follow Up Plan: Telephone follow up appointment with care management team member scheduled for: 11/2021       Medication Assistance:  [place]el[place]ained through [facility] and [place]  medication assistance program.  Enrollment ends 10/17/2021  Patient's preferred pharmacy is:  [place], [place] - [address]. [place] [address] Phone: [phone] Fax: [phone]  [facility]  ([facility] - [place], [place] [address] Phone: [phone] Fax: [phone]  Uses pill box? Yes Pt endorses 100% compliance  We discussed: Current pharmacy is preferred with insurance plan and patient is satisfied with pharmacy services Patient decided to: Continue current medication management strategy  Care Plan and Follow Up Patient Decision:  Patient agrees to Care Plan and Follow-up.  Plan: Telephone follow up appointment with care management team member scheduled for:  11/2021. Pharmacy team will help with PAP renewal for [facility] in December 2022.

## 2021-05-27 ENCOUNTER — Ambulatory Visit (INDEPENDENT_AMBULATORY_CARE_PROVIDER_SITE_OTHER): Payer: Medicare PPO

## 2021-05-27 DIAGNOSIS — E1121 Type 2 diabetes mellitus with diabetic nephropathy: Secondary | ICD-10-CM

## 2021-05-27 DIAGNOSIS — E1169 Type 2 diabetes mellitus with other specified complication: Secondary | ICD-10-CM

## 2021-05-27 DIAGNOSIS — I25708 Atherosclerosis of coronary artery bypass graft(s), unspecified, with other forms of angina pectoris: Secondary | ICD-10-CM | POA: Diagnosis not present

## 2021-05-27 NOTE — Patient Instructions (Signed)
Visit Information   Goals Addressed             This Visit's Progress    Manage My Medicine   On track    Timeframe:  Long-Range Goal Priority:  High Start Date:         12/12/20                     Expected End Date:  12/12/21                      Follow Up Date 05/27/2021    - call for medicine refill 2 or 3 days before it runs out - keep a list of all the medicines I take; vitamins and herbals too - use a pillbox to sort medicine    Why is this important?   These steps will help you keep on track with your medicines.   Notes:      Set My Target A1C-Diabetes Type 2   On track    Timeframe:  Long-Range Goal Priority:  High Start Date:     12/12/2020                        Expected End Date:    12/12/2021                   Follow Up Date 05/27/2022    - set target A1C    Why is this important?   Your target A1C is decided together by you and your doctor.  It is based on several things like your age and other health issues.    Notes:      Track and Manage My Blood Pressure-Hypertension   On track    Timeframe:  Long-Range Goal Priority:  High Start Date:        12/12/2020                     Expected End Date:    12/12/2021                   Follow Up Date 05/27/2022    - check blood pressure weekly - choose a place to take my blood pressure (home, clinic or office, retail store) - write blood pressure results in a log or diary    Why is this important?   You won't feel high blood pressure, but it can still hurt your blood vessels.  High blood pressure can cause heart or kidney problems. It can also cause a stroke.  Making lifestyle changes like losing a little weight or eating less salt will help.  Checking your blood pressure at home and at different times of the day can help to control blood pressure.  If the doctor prescribes medicine remember to take it the way the doctor ordered.  Call the office if you cannot afford the medicine or if there are  questions about it.     Notes:        Patient Care Plan: CCM Pharmacy Care Plan     Problem Identified: dm, htn, hld, cad   Priority: High  Onset Date: 12/12/2020     Long-Range Goal: Disease Management   Start Date: 12/12/2020  Expected End Date: 12/12/2021  Recent Progress: On track  Priority: High  Note:   Current Barriers:  Unable to independently afford treatment regimen  Pharmacist Clinical Goal(s):  Over the next 90  days, patient will verbalize ability to afford treatment regimen maintain control of diabetes as evidenced by a1c  through collaboration with PharmD and provider.   Interventions: 1:1 collaboration with Abigail Miyamoto, MD regarding development and update of comprehensive plan of care as evidenced by provider attestation and co-signature Inter-disciplinary care team collaboration (see longitudinal plan of care) Comprehensive medication review performed; medication list updated in electronic medical record  Hypertension (BP goal <130/80) -Controlled -Current treatment: Metoprolol tartrate 25 mg bid  -Medications previously tried: none noted -Current home readings: 120-130/70s -Current dietary habits: low potassium diet. Trying to increase intake to avoid additional weight loss.  -Current exercise habits: stays active around his house and walking daughter's dog -Denies hypotensive/hypertensive symptoms -Educated on BP goals and benefits of medications for prevention of heart attack, stroke and kidney damage; Daily salt intake goal < 2300 mg; Exercise goal of 150 minutes per week; Importance of home blood pressure monitoring; -Counseled to monitor BP at home weekly, document, and provide log at future appointments -Counseled on diet and exercise extensively Recommended to continue current medication  Hyperlipidemia: (LDL goal < 55) -Not ideally controlled -Current treatment: Simvastatin 40 mg - 1/2 tablet daily  -Medications previously tried:  none reported  -Current dietary patterns: low potassium diet.  -Current exercise habits: travels frequently to visit family. Works around his home and walks with dog..  -Educated on Cholesterol goals;  Benefits of statin for ASCVD risk reduction; Importance of limiting foods high in cholesterol; Exercise goal of 150 minutes per week; -Counseled on diet and exercise extensively Recommended to continue current medication  Diabetes (A1c goal <7%) -Controlled -Current medications: Metformin 500 mg bid (GFR range recently 39-43 so dosing appropriate - continue to monitor) -Medications previously tried: none reported  -Current home glucose readings fasting glucose: 90-115 mg/dL post prandial glucose: none reported -Denies hypoglycemic/hyperglycemic symptoms -Current meal patterns:  Eats a variety of fruits and vegetables. Limits potassium in diet due to kidney. Trying to increase protein.  -Current exercise: stays active around home.  -Educated onA1c and blood sugar goals; Complications of diabetes including kidney damage, retinal damage, and cardiovascular disease; Exercise goal of 150 minutes per week; Benefits of routine self-monitoring of blood sugar; -Counseled to check feet daily and get yearly eye exams -Counseled on diet and exercise extensively Recommended to continue current medication  Hypothyroidism (Goal: manage TSH) -Controlled -Current treatment  Levothyroxine 100 mcg daily -Medications previously tried: none reported  -Recommended to continue current medication  Health Maintenance -Vaccine gaps: recommended updating tetanus shot  -Counseled on benefits of Tetanus and shingles shot.    Patient Goals/Self-Care Activities Over the next 90 days, patient will:  - take medications as prescribed focus on medication adherence by using pill box check glucose daily , document, and provide at future appointments check blood pressure weekly, document, and provide at  future appointments  Follow Up Plan: Telephone follow up appointment with care management team member scheduled for: 11/2021      The patient verbalized understanding of instructions, educational materials, and care plan provided today and declined offer to receive copy of patient instructions, educational materials, and care plan.  Telephone follow up appointment with pharmacy team member scheduled for: December for PAP renewal and 11/2021 for pharmacist follow-up unless needed sooner.   Earvin Hansen, [facility]

## 2021-06-19 ENCOUNTER — Other Ambulatory Visit: Payer: Self-pay | Admitting: Legal Medicine

## 2021-06-19 DIAGNOSIS — Z8739 Personal history of other diseases of the musculoskeletal system and connective tissue: Secondary | ICD-10-CM

## 2021-06-19 DIAGNOSIS — E1121 Type 2 diabetes mellitus with diabetic nephropathy: Secondary | ICD-10-CM

## 2021-06-19 DIAGNOSIS — I10 Essential (primary) hypertension: Secondary | ICD-10-CM

## 2021-07-01 ENCOUNTER — Other Ambulatory Visit: Payer: Self-pay

## 2021-07-01 DIAGNOSIS — E1121 Type 2 diabetes mellitus with diabetic nephropathy: Secondary | ICD-10-CM

## 2021-07-01 MED ORDER — ACCU-CHEK AVIVA PLUS VI STRP: ORAL_STRIP | 3 refills | Status: DC

## 2021-07-01 MED ORDER — ACCU-CHEK FASTCLIX LANCETS MISC: 1.0000 | Freq: Two times a day (BID) | 6 refills | Status: DC

## 2021-07-01 NOTE — Telephone Encounter (Signed)
Refill sent to pharmacy.   

## 2021-07-07 ENCOUNTER — Other Ambulatory Visit: Payer: Self-pay

## 2021-07-07 MED ORDER — ACCU-CHEK AVIVA VI SOLN
1.0000 | 5 refills | Status: DC
Start: 2021-07-07 — End: 2024-02-17

## 2021-07-07 MED ORDER — ALCOHOL PREP 70 % PADS: 1.0000 | MEDICATED_PAD | Freq: Every day | 2 refills | Status: DC

## 2021-07-22 ENCOUNTER — Telehealth: Payer: Self-pay

## 2021-07-22 NOTE — Chronic Care Management (AMB) (Signed)
Chronic Care Management Pharmacy Assistant   Name: HAO DION  MRN: [id number] DOB: Jan 09, 1941   Reason for Encounter: General Adherence Call    Recent office visits:  None since 05/27/21  Recent consult visits:  None since 05/27/21  Hospital visits:  None since 05/27/21  Medications: Outpatient Encounter Medications as of 07/22/2021  Medication Sig   ACCU-CHEK AVIVA PLUS test strip USE TWICE DAILY AS DIRECTED   Accu-Chek FastClix Lancets MISC 1 each by Does not apply route in the morning and at bedtime. Use to check blood sugar twice daily as directed.   Alcohol Swabs (ALCOHOL PREP) 70 % PADS 1 each by Does not apply route daily.   allopurinol (ZYLOPRIM) 100 MG tablet TAKE 1 TABLET EVERY DAY   aspirin EC 81 MG tablet Take 81 mg by mouth daily.   bethanechol (URECHOLINE) 5 MG tablet Take 1 tablet (5 mg total) by mouth 2 (two) times daily.   Blood Glucose Calibration (ACCU-CHEK AVIVA) SOLN 1 each by In Vitro route every 30 (thirty) days.   Cholecalciferol (VITAMIN D-1000 MAX ST) 25 MCG (1000 UT) tablet Take 2,000 Units by mouth.    COLCRYS 0.6 MG tablet Prn gout flares   finasteride (PROSCAR) 5 MG tablet Take 1 tablet (5 mg total) by mouth daily.   levothyroxine (SYNTHROID) 100 MCG tablet TAKE 1 TABLET BY MOUTH ONCE DAILY   LOKELMA 10 g PACK packet Take 10 g by mouth daily.    meclizine (ANTIVERT) 25 MG tablet Take by mouth. Takes OTC prn dizziness.   metFORMIN (GLUCOPHAGE) 500 MG tablet TAKE 1 TABLET TWICE DAILY   metoprolol tartrate (LOPRESSOR) 25 MG tablet TAKE 1 TABLET TWICE DAILY   nitroGLYCERIN (NITROSTAT) 0.4 MG SL tablet Comments:   Patient Notes: Place 1 tablet under tongue as directed USE EVERY 5 MINUTES UP TO THREE DOSES  Duration: 30   simvastatin (ZOCOR) 40 MG tablet TAKE 1/2 TABLET EVERY DAY   sodium bicarbonate 650 MG tablet Take by mouth 2 (two) times daily.   vitamin B-12 (CYANOCOBALAMIN) 1000 MCG tablet Take 1,000 mcg by mouth daily.   No  facility-administered encounter medications on file as of 07/22/2021.    Contacted [name] for general disease state and medication adherence call.   Patient > 5 days past due for refill on the following medications per chart history:  [facility] Medications: Medication Name/mg Last Fill Days Supply Metformin 500 mg  05/13/20 90ds  Simvastatin 40 mg  04/13/20 90ds   What concerns do you have about your medications?  The patient side effects with his medications.   How often do you forget or accidentally miss a dose?   Do you use a pillbox?   Are you having any problems getting your medications from your pharmacy?   Has the cost of your medications been a concern?  If yes, what medication and is patient assistance available or has it been applied for?  Since last visit with CPP, nterventions have been made:   The patient had an ED visit since last contact.   The patient problems with their health.    Patient states BP and BG readings are as follows  Care Gaps: Last annual wellness visit? Done on 05/08/20 If applicable: Last eye exam / retinopathy screening? Done on 09/29/20 Diabetic foot exam? Done on 04/29/21   Roxana Hires, CMA Clinical Pharmacist Assistant  [phone]    I have made 3 attempts in trying to reach pt. I have been unsuccessful  at completing this assessment.

## 2021-09-08 ENCOUNTER — Telehealth: Payer: Self-pay

## 2021-09-08 NOTE — Chronic Care Management (AMB) (Signed)
Chronic Care Management Pharmacy Assistant   Name: MAVRIK BYNUM  MRN: [id number] DOB: Nov 18, 1940   Reason for Encounter: Disease State call for HTN    Recent office visits:  None   Recent consult visits:  None   Hospital visits:  None   Medications: Outpatient Encounter Medications as of 09/08/2021  Medication Sig   ACCU-CHEK AVIVA PLUS test strip USE TWICE DAILY AS DIRECTED   Accu-Chek FastClix Lancets MISC 1 each by Does not apply route in the morning and at bedtime. Use to check blood sugar twice daily as directed.   Alcohol Swabs (ALCOHOL PREP) 70 % PADS 1 each by Does not apply route daily.   allopurinol (ZYLOPRIM) 100 MG tablet TAKE 1 TABLET EVERY DAY   aspirin EC 81 MG tablet Take 81 mg by mouth daily.   bethanechol (URECHOLINE) 5 MG tablet Take 1 tablet (5 mg total) by mouth 2 (two) times daily.   Blood Glucose Calibration (ACCU-CHEK AVIVA) SOLN 1 each by In Vitro route every 30 (thirty) days.   Cholecalciferol (VITAMIN D-1000 MAX ST) 25 MCG (1000 UT) tablet Take 2,000 Units by mouth.    COLCRYS 0.6 MG tablet Prn gout flares   finasteride (PROSCAR) 5 MG tablet Take 1 tablet (5 mg total) by mouth daily.   levothyroxine (SYNTHROID) 100 MCG tablet TAKE 1 TABLET BY MOUTH ONCE DAILY   LOKELMA 10 g PACK packet Take 10 g by mouth daily.    meclizine (ANTIVERT) 25 MG tablet Take by mouth. Takes OTC prn dizziness.   metFORMIN (GLUCOPHAGE) 500 MG tablet TAKE 1 TABLET TWICE DAILY   metoprolol tartrate (LOPRESSOR) 25 MG tablet TAKE 1 TABLET TWICE DAILY   nitroGLYCERIN (NITROSTAT) 0.4 MG SL tablet Comments:   Patient Notes: Place 1 tablet under tongue as directed USE EVERY 5 MINUTES UP TO THREE DOSES  Duration: 30   simvastatin (ZOCOR) 40 MG tablet TAKE 1/2 TABLET EVERY DAY   sodium bicarbonate 650 MG tablet Take by mouth 2 (two) times daily.   vitamin B-12 (CYANOCOBALAMIN) 1000 MCG tablet Take 1,000 mcg by mouth daily.   No facility-administered encounter medications on  file as of 09/08/2021.    Recent Office Vitals: BP Readings from Last 3 Encounters:  04/29/21 112/80  02/25/21 (!) 158/86  12/23/20 130/70   Pulse Readings from Last 3 Encounters:  04/29/21 76  02/25/21 65  12/23/20 70    Wt Readings from Last 3 Encounters:  04/29/21 160 lb 3.2 oz (72.7 kg)  02/25/21 166 lb (75.3 kg)  12/23/20 167 lb (75.8 kg)     Kidney Function Lab Results  Component Value Date/Time   CREATININE 1.75 (H) 05/07/2021 08:59 AM   CREATININE 1.67 (H) 04/29/2021 08:54 AM   GFRNONAA 43 (L) 08/19/2020 09:32 AM   GFRAA 50 (L) 08/19/2020 09:32 AM    BMP Latest Ref Rng & Units 05/07/2021 04/29/2021 12/23/2020  Glucose 65 - 99 mg/dL 98 99 675(F)  BUN 8 - 27 mg/dL 27 20 27   Creatinine 0.76 - 1.27 mg/dL ) 1.63(W) 4.66(Z)  BUN/Creat Ratio 10 - 24 15 12 18   Sodium 134 - 144 mmol/L 145(H) 142 144  Potassium 3.5 - 5.2 mmol/L 5.6(H) 5.5(H) 5.2  Chloride 96 - 106 mmol/L 107(H) 103 103  CO2 20 - 29 mmol/L 27 25 25   Calcium 8.6 - 10.2 mg/dL 9.1 9.2 9.3     Current antihypertensive regimen:  Metoprolol tartrate 25 mg bid   Wrist or arm cuff: Caffeine intake: Salt  intake: OTC medications including pseudoephedrine or NSAIDs?  What recent interventions/DTPs have been made by any provider to improve Blood Pressure control since last CPP Visit:   What diet changes have been made to improve Blood Pressure Control?    What exercise is being done to improve your Blood Pressure Control?    Adherence Review: Is the patient currently on ACE/ARB medication? Yes Does the patient have >5 day gap between last estimated fill dates? CPP to review  Care Gaps: Last annual wellness visit?05/08/20  Star Rating Drugs:  Medication:  Last Fill: Day Supply Metformin 500 mg       05/13/20            90ds     Simvastatin 40 mg       04/13/20            90ds   Roxana Hires, CMA Clinical Pharmacist Assistant  [phone]    I have made 3 attempts at contacting pt and  all were unsuccessful

## 2021-09-22 ENCOUNTER — Encounter: Payer: Self-pay | Admitting: Legal Medicine

## 2021-09-22 ENCOUNTER — Ambulatory Visit: Payer: Medicare PPO | Admitting: Legal Medicine

## 2021-09-22 ENCOUNTER — Ambulatory Visit (INDEPENDENT_AMBULATORY_CARE_PROVIDER_SITE_OTHER): Payer: Medicare PPO

## 2021-09-22 ENCOUNTER — Other Ambulatory Visit: Payer: Self-pay

## 2021-09-22 VITALS — BP 110/60 | HR 65 | Temp 97.5°F | Resp 15 | Ht 68.0 in | Wt 165.0 lb

## 2021-09-22 DIAGNOSIS — E782 Mixed hyperlipidemia: Secondary | ICD-10-CM | POA: Diagnosis not present

## 2021-09-22 DIAGNOSIS — Z23 Encounter for immunization: Secondary | ICD-10-CM | POA: Diagnosis not present

## 2021-09-22 DIAGNOSIS — N401 Enlarged prostate with lower urinary tract symptoms: Secondary | ICD-10-CM | POA: Diagnosis not present

## 2021-09-22 DIAGNOSIS — R350 Frequency of micturition: Secondary | ICD-10-CM | POA: Diagnosis not present

## 2021-09-22 DIAGNOSIS — I10 Essential (primary) hypertension: Secondary | ICD-10-CM

## 2021-09-22 DIAGNOSIS — I25708 Atherosclerosis of coronary artery bypass graft(s), unspecified, with other forms of angina pectoris: Secondary | ICD-10-CM

## 2021-09-22 DIAGNOSIS — N183 Chronic kidney disease, stage 3 unspecified: Secondary | ICD-10-CM | POA: Diagnosis not present

## 2021-09-22 DIAGNOSIS — E039 Hypothyroidism, unspecified: Secondary | ICD-10-CM

## 2021-09-22 DIAGNOSIS — Z6825 Body mass index (BMI) 25.0-25.9, adult: Secondary | ICD-10-CM | POA: Diagnosis not present

## 2021-09-22 DIAGNOSIS — E1121 Type 2 diabetes mellitus with diabetic nephropathy: Secondary | ICD-10-CM | POA: Diagnosis not present

## 2021-09-22 NOTE — Progress Notes (Signed)
Subjective:  Patient ID: David Baxter, male    DOB: 09-Aug-1941  Age: 80 y.o. MRN: [id number]  Chief Complaint  Patient presents with   Diabetes   Hypertension   Hyperlipidemia     HPI: chronic visit  Patient present with type 2 diabetes.  Specifically, this is type 2, noninsulin requiring diabetes, complicated by h.ypertensin and hypercholesterolemia  Compliance with treatment has been good; patient take medicines as directed, maintains diet and exercise regimen, follows up as directed, and is keeping glucose diary.  Date of  diagnosis 2010.  Depression screen has been performed.Tobacco screen nonsmoke. Current medicines for diabetes metformin.  Patient is on none for renal protection and simvastatin for cholesterol control.  Patient performs foot exams daily and last ophthalmologic exam was had eye exam.   Patient presents for follow up of hypertension.  Patient tolerating metoprolol well with side effects.  Patient was diagnosed with hypertension 2010 so has been treated for hypertension for 12 years.Patient is working on maintaining diet and exercise regimen and follows up as directed. Complication include cad.   Patient presents with hyperlipidemia.  Compliance with treatment has been good; patient takes medicines as directed, maintains low cholesterol diet, follows up as directed, and maintains exercise regimen.  Patient is using simvastatin without problems.    Current Outpatient Medications on File Prior to Visit  Medication Sig Dispense Refill   ACCU-CHEK AVIVA PLUS test strip USE TWICE DAILY AS DIRECTED 200 strip 3   Accu-Chek FastClix Lancets MISC 1 each by Does not apply route in the morning and at bedtime. Use to check blood sugar twice daily as directed. 100 each 6   Alcohol Swabs (ALCOHOL PREP) 70 % PADS 1 each by Does not apply route daily. 100 each 2   allopurinol (ZYLOPRIM) 100 MG tablet TAKE 1 TABLET EVERY DAY 90 tablet 2   aspirin EC 81 MG tablet Take 81 mg by mouth  daily.     bethanechol (URECHOLINE) 5 MG tablet Take 1 tablet (5 mg total) by mouth 2 (two) times daily. 180 tablet 3   Blood Glucose Calibration (ACCU-CHEK AVIVA) SOLN 1 each by In Vitro route every 30 (thirty) days. 1 each 5   Cholecalciferol (VITAMIN D-1000 MAX ST) 25 MCG (1000 UT) tablet Take 2,000 Units by mouth.      COLCRYS 0.6 MG tablet Prn gout flares     finasteride (PROSCAR) 5 MG tablet Take 1 tablet (5 mg total) by mouth daily. 90 tablet 3   levothyroxine (SYNTHROID) 100 MCG tablet TAKE 1 TABLET BY MOUTH ONCE DAILY 90 tablet 2   LOKELMA 10 g PACK packet Take 10 g by mouth daily.      meclizine (ANTIVERT) 25 MG tablet Take by mouth. Takes OTC prn dizziness.     metFORMIN (GLUCOPHAGE) 500 MG tablet TAKE 1 TABLET TWICE DAILY 180 tablet 2   metoprolol tartrate (LOPRESSOR) 25 MG tablet TAKE 1 TABLET TWICE DAILY 180 tablet 2   nitroGLYCERIN (NITROSTAT) 0.4 MG SL tablet Comments:   Patient Notes: Place 1 tablet under tongue as directed USE EVERY 5 MINUTES UP TO THREE DOSES  Duration: 30     simvastatin (ZOCOR) 40 MG tablet TAKE 1/2 TABLET EVERY DAY 45 tablet 2   sodium bicarbonate 650 MG tablet Take by mouth 2 (two) times daily.     vitamin B-12 (CYANOCOBALAMIN) 1000 MCG tablet Take 1,000 mcg by mouth daily.     No current facility-administered medications on file prior to visit.  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 DES OM 2012    2. 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   SIRS (systemic inflammatory response syndrome) (HCC) 04/08/2018   Type 2 diabetes mellitus (HCC) 04/08/2018   UTI (urinary tract infection) 04/08/2018   Past Surgical History:  Procedure Laterality Date   CORONARY ANGIOPLASTY WITH STENT PLACEMENT  03/04/2015   x2 DES to CFX   CORONARY ANGIOPLASTY WITH STENT PLACEMENT  2012   x1 DES to OM   CYSTOSCOPY WITH INSERTION OF UROLIFT N/A 02/14/2017   Procedure: CYSTOSCOPY WITH INSERTION OF UROLIFT;  Surgeon: Malen Gauze, MD;  Location: [facility];  Service: Urology;  Laterality: N/A;   EXTRACORPOREAL SHOCK WAVE LITHOTRIPSY  1990's   PERCUTANEOUS NEPHROSTOLITHOTOMY  1991   and abdominal hernia repair   URETEROLITHOTOMY  1991 and 1994    Family History  Problem Relation Age of Onset   Heart disease Mother 13       After age 52 - CHF   Hypertension Father    Heart disease Father 5       After age 6 - CHF   Social History   Socioeconomic History   Marital status: Married    Spouse name: Not on file   Number of children: Not on file   Years of education: Not on file   Highest education level: Not on file  Occupational History   Occupation: truck driver  Tobacco Use   Smoking status: Never   Smokeless tobacco: Never  Substance and Sexual Activity   Alcohol use: No   Drug use: No   Sexual activity:  Yes    Partners: Female  Other Topics Concern   Not on file  Social History Narrative   Not on file   Social Determinants of Health   Financial Resource Strain: Not on file  Food Insecurity: Not on file  Transportation Needs: Not on file  Physical Activity: Not on file  Stress: Not on file  Social Connections: Not on file    Review of Systems  Constitutional:  Negative for chills, fatigue, fever and unexpected weight change.  HENT:  Negative for congestion, ear pain, sinus pain and sore throat.  Respiratory:  Negative for cough and shortness of breath.   Cardiovascular:  Negative for chest pain and palpitations.  Gastrointestinal:  Negative for abdominal pain, blood in stool, constipation, diarrhea, nausea and vomiting.  Endocrine: Negative for polydipsia.  Genitourinary:  Negative for dysuria.  Musculoskeletal:  Negative for back pain.  Skin:  Negative for rash.  Neurological:  Negative for headaches.  Psychiatric/Behavioral: Negative.      Objective:  BP 110/60   Pulse 65   Temp (!) 97.5 F (36.4 C)   Resp 15   Ht 5\' 8"  (1.727 m)   Wt 165 lb (74.8 kg)   SpO2 98%   BMI 25.09 kg/m   BP/Weight 09/22/2021 04/29/2021 [id number]  Systolic BP [id number]  Diastolic BP 60 80 86  Wt. (Lbs) 165 160.2 166  BMI 25.09 22.34 23.15    Physical Exam Vitals reviewed.  Constitutional:      Appearance: Normal appearance.  HENT:     Head: Normocephalic.     Right Ear: Tympanic membrane, ear canal and external ear normal.     Left Ear: Tympanic membrane, ear canal and external ear normal.     Mouth/Throat:     Mouth: Mucous membranes are moist.     Pharynx: Oropharynx is clear.  Eyes:     Extraocular Movements: Extraocular movements intact.     Conjunctiva/sclera: Conjunctivae normal.     Pupils: Pupils are equal, round, and reactive to light.  Cardiovascular:     Rate and Rhythm: Normal rate and regular rhythm.     Pulses: Normal pulses.     Heart sounds: Normal heart  sounds. No murmur heard.   No gallop.  Pulmonary:     Effort: Pulmonary effort is normal. No respiratory distress.     Breath sounds: Normal breath sounds. No wheezing.  Abdominal:     General: Abdomen is flat. Bowel sounds are normal. There is no distension.     Palpations: Abdomen is soft.     Tenderness: There is no abdominal tenderness.  Musculoskeletal:        General: Normal range of motion.     Cervical back: Normal range of motion and neck supple.     Right lower leg: No edema.     Left lower leg: No edema.  Skin:    General: Skin is warm.     Capillary Refill: Capillary refill takes less than 2 seconds.  Neurological:     General: No focal deficit present.     Mental Status: He is alert and oriented to person, place, and time. Mental status is at baseline.  Psychiatric:        Mood and Affect: Mood normal.        Thought Content: Thought content normal.    Diabetic Foot Exam - Simple   Simple Foot Form Diabetic Foot exam was performed with the following findings: Yes 09/22/2021  8:56 AM  Visual Inspection See comments: Yes Sensation Testing Intact to touch and monofilament testing bilaterally: Yes Pulse Check Posterior Tibialis and Dorsalis pulse intact bilaterally: Yes Comments bunion      Lab Results  Component Value Date   WBC 9.0 04/29/2021   HGB 14.4 04/29/2021   HCT 44.7 04/29/2021   PLT 333 04/29/2021   GLUCOSE 98 05/07/2021   CHOL 119 04/29/2021   TRIG 112 04/29/2021   HDL 37 (L) 04/29/2021   LDLCALC 61 04/29/2021   ALT 10 05/07/2021   AST 14 05/07/2021   NA 145 (  H) 05/07/2021   K 5.6 (H) 05/07/2021   CL 107 (H) 05/07/2021   CREATININE 1.75 (H) 05/07/2021   BUN 27 05/07/2021   CO2 27 05/07/2021   TSH 2.690 08/19/2020   HGBA1C 5.9 (H) 04/29/2021   MICROALBUR 80 12/23/2020      Assessment & Plan:   Problem List Items Addressed This Visit       Cardiovascular and Mediastinum   Coronary artery disease of bypass graft of native heart  with stable angina pectoris ([place]) - Primary An individual plan was formulated based on patient history and exam, labs and evidence based data. Patient has not had recent angina or nitroglycerin use. continue present treatment.     Essential hypertension   Relevant Orders   Comprehensive metabolic panel   CBC with Differential/Platelet An individual hypertension care plan was established and reinforced today.  The patient's status was assessed using clinical findings on exam and labs or diagnostic tests. The patient's success at meeting treatment goals on disease specific evidence-based guidelines and found to be well controlled. SELF MANAGEMENT: The patient and I together assessed ways to personally work towards obtaining the recommended goals. RECOMMENDATIONS: avoid decongestants found in common cold remedies, decrease consumption of alcohol, perform routine monitoring of BP with home BP cuff, exercise, reduction of dietary salt, take medicines as prescribed, try not to miss doses and quit smoking.  Regular exercise and maintaining a healthy weight is needed.  Stress reduction may help. A CLINICAL SUMMARY including written plan identify barriers to care unique to individual due to social or financial issues.  We attempt to mutually creat solutions for individual and family understanding.      Endocrine   Acquired hypothyroidism   Relevant Orders   TSH Patient is known to have hypothyroidism and is n treatment with levothyroxine 135mcg.  Patient was diagnosed 10 years ago.  Other treatment includes none.  Patient is compliant with medicines and last TSH 6 months ago.  Last TSH was normal .    Diabetic glomerulopathy (HCC)   Relevant Orders   Hemoglobin A1c An individual care plan for diabetes was established and reinforced today.  The patient's status was assessed using clinical findings on exam, labs and diagnostic testing. Patient success at meeting goals based on disease specific  evidence-based guidelines and found to be good controlled. Medications were assessed and patient's understanding of the medical issues , including barriers were assessed. Recommend adherence to a diabetic diet, a graduated exercise program, HgbA1c level is checked quarterly, and urine microalbumin performed yearly .  Annual mono-filament sensation testing performed. Lower blood pressure and control hyperlipidemia is important. Get annual eye exams and annual flu shots and smoking cessation discussed.  Self management goals were discussed.      Genitourinary   Benign prostatic hyperplasia   Relevant Orders   PSA AN INDIVIDUAL CARE PLAN for BPH was established and reinforced today.  The patient's status was assessed using clinical findings on exam, labs, and other diagnostic testing. Patient's success at meeting treatment goals based on disease specific evidence-bassed guidelines and found to be in good control. RECOMMENDATIONS include maintaining present medicines and treatment.     Chronic kidney disease, stage 3 unspecified ([place]) AN INDIVIDUAL CARE PLAN renal filure was established and reinforced today.  The patient's status was assessed using clinical findings on exam, labs, and other diagnostic testing. Patient's success at meeting treatment goals based on disease specific evidence-bassed guidelines and found to be in good control. RECOMMENDATIONS include maintaining  present medicines and treatment.      Other   Hyperlipidemia   Relevant Orders   Lipid panel AN INDIVIDUAL CARE PLAN for hyperlipidemia/ cholesterol was established and reinforced today.  The patient's status was assessed using clinical findings on exam, lab and other diagnostic tests. The patient's disease status was assessed based on evidence-based guidelines and found to be well controlled. MEDICATIONS were reviewed. SELF MANAGEMENT GOALS have been discussed and patient's success at attaining the goal of low cholesterol was  assessed. RECOMMENDATION given include regular exercise 3 days a week and low cholesterol/low fat diet. CLINICAL SUMMARY including written plan to identify barriers unique to the patient due to social or economic  reasons was discussed.     BMI 25.0-25.9,adult continue diet and exercise, needs supplements   Other Visit Diagnoses     Need for influenza vaccination       Relevant Orders   Flu Vaccine QUAD High Dose(Fluad) (Completed)     .     Orders Placed This Encounter  Procedures   Flu Vaccine QUAD High Dose(Fluad)   Comprehensive metabolic panel   PSA   TSH   Hemoglobin A1c   Lipid panel   CBC with Differential/Platelet      Follow-up: Return in about 4 months (around 01/21/2022) for fasting. 30 minute visit with review or old records An After Visit Summary was printed and given to the patient.  [name], MD [facility] [phone]

## 2021-09-23 ENCOUNTER — Ambulatory Visit (HOSPITAL_COMMUNITY)
Admission: RE | Admit: 2021-09-23 | Discharge: 2021-09-23 | Disposition: A | Discharge status: Home / Self Care | Payer: Medicare PPO | Source: Ambulatory Visit | Attending: Urology | Admitting: Urology

## 2021-09-23 DIAGNOSIS — N1330 Unspecified hydronephrosis: Secondary | ICD-10-CM | POA: Diagnosis not present

## 2021-09-23 DIAGNOSIS — N200 Calculus of kidney: Secondary | ICD-10-CM | POA: Diagnosis not present

## 2021-09-23 DIAGNOSIS — N132 Hydronephrosis with renal and ureteral calculous obstruction: Secondary | ICD-10-CM | POA: Diagnosis not present

## 2021-09-23 LAB — COMPREHENSIVE METABOLIC PANEL
ALT: 7 IU/L (ref 0–44)
AST: 13 IU/L (ref 0–40)
Albumin/Globulin Ratio: 1.8 (ref 1.2–2.2)
Albumin: 4.2 g/dL (ref 3.6–4.6)
Alkaline Phosphatase: 82 IU/L (ref 44–121)
BUN/Creatinine Ratio: 13 (ref 10–24)
BUN: 20 mg/dL (ref 8–27)
Bilirubin Total: 0.3 mg/dL (ref 0.0–1.2)
CO2: 26 mmol/L (ref 20–29)
Calcium: 9.1 mg/dL (ref 8.6–10.2)
Chloride: 105 mmol/L (ref 96–106)
Creatinine, Ser: 1.6 mg/dL — ABNORMAL HIGH (ref 0.76–1.27)
Globulin, Total: 2.4 g/dL (ref 1.5–4.5)
Glucose: 117 mg/dL — ABNORMAL HIGH (ref 70–99)
Potassium: 5.8 mmol/L — ABNORMAL HIGH (ref 3.5–5.2)
Sodium: 146 mmol/L — ABNORMAL HIGH (ref 134–144)
Total Protein: 6.6 g/dL (ref 6.0–8.5)
eGFR: 43 mL/min/{1.73_m2} — ABNORMAL LOW (ref >59)

## 2021-09-23 LAB — CBC WITH DIFFERENTIAL/PLATELET
Basophils Absolute: 0.1 10*3/uL (ref 0.0–0.2)
Basos: 2 %
EOS (ABSOLUTE): 0.5 10*3/uL — ABNORMAL HIGH (ref 0.0–0.4)
Eos: 5 %
Hematocrit: 43.5 % (ref 37.5–51.0)
Hemoglobin: 14.4 g/dL (ref 13.0–17.7)
Immature Grans (Abs): 0 10*3/uL (ref 0.0–0.1)
Immature Granulocytes: 0 %
Lymphocytes Absolute: 2.9 10*3/uL (ref 0.7–3.1)
Lymphs: 31 %
MCH: 29.1 pg (ref 26.6–33.0)
MCHC: 33.1 g/dL (ref 31.5–35.7)
MCV: 88 fL (ref 79–97)
Monocytes Absolute: 1 10*3/uL — ABNORMAL HIGH (ref 0.1–0.9)
Monocytes: 10 %
Neutrophils Absolute: 5 10*3/uL (ref 1.4–7.0)
Neutrophils: 52 %
Platelets: 362 10*3/uL (ref 150–450)
RBC: 4.95 x10E6/uL (ref 4.14–5.80)
RDW: 13.9 % (ref 11.6–15.4)
WBC: 9.5 10*3/uL (ref 3.4–10.8)

## 2021-09-23 LAB — HEMOGLOBIN A1C
Est. average glucose Bld gHb Est-mCnc: 126 mg/dL
Hgb A1c MFr Bld: 6 % — ABNORMAL HIGH (ref 4.8–5.6)

## 2021-09-23 LAB — CARDIOVASCULAR RISK ASSESSMENT

## 2021-09-23 LAB — LIPID PANEL
Chol/HDL Ratio: 2.8 ratio (ref 0.0–5.0)
Cholesterol, Total: 134 mg/dL (ref 100–199)
HDL: 48 mg/dL (ref >39)
LDL Chol Calc (NIH): 73 mg/dL (ref 0–99)
Triglycerides: 61 mg/dL (ref 0–149)
VLDL Cholesterol Cal: 13 mg/dL (ref 5–40)

## 2021-09-23 LAB — TSH: TSH: 3.85 u[IU]/mL (ref 0.450–4.500)

## 2021-09-23 LAB — PSA: Prostate Specific Ag, Serum: 0.2 ng/mL (ref 0.0–4.0)

## 2021-09-23 NOTE — Progress Notes (Signed)
Glucose 117, kidney tests stage 3b, potassium high 5.8, liver tests normal, PSA 0.2 normal, TSH 3.85 normal, A1c 6.0, cholesterol OK, cbc normal lp

## 2021-09-24 NOTE — Progress Notes (Signed)
Cardiology Office Note:    Date:  09/25/2021   ID:  David Baxter, DOB [birth year], MRN [id number]  PCP:  Abigail Miyamoto, MD  Cardiologist:  Norman Herrlich, MD    Referring MD: Abigail Miyamoto,*    ASSESSMENT:    1. CAD in native artery   2. 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 Mixed hyperlipidemia   4. Orthostatic hypotension    PLAN:    In order of problems listed above:  David Baxter continues to do well chronic stable coronary artery disease he takes low-dose aspirin beta-blocker and a statin having no angina at this time I would not pursue an ischemia evaluation. Stable BP is mildly elevated today I am reluctant to change his medications and isolated reading continue to trend home blood pressures Stable lipids at target Stable no recurrent orthostatic hypotension  Next appointment: 1 year   Medication Adjustments/Labs and Tests Ordered: Current medicines are reviewed at length with the patient today.  Concerns regarding medicines are outlined above.  No orders of the defined types were placed in this encounter.  No orders of the defined types were placed in this encounter.   Chief Complaint  Patient presents with   Follow-up   Coronary Artery Disease   Hypotension    History of Present Illness:    David Baxter is a 80 y.o. male with a hx of CAD with PCI and 2 DES to [facility] 2016 T2 DM, hypertension, hyperlipidemia, CKD stage 3  hyperkalemai and orthostatic hypotension  last seen 06/19/2020.  Compliance with diet, lifestyle and medications: Yes  He is pleased with the quality of his life he remains active around the home and is has no cardiovascular symptoms of edema chest pain shortness of breath palpitation or syncope.  He has not been lightheaded.  Most recent lipid profile his LDL is at target 61 A1c is good at 5.9% creatinine remains elevated at 1.75 and potassium mildly elevated 5.6 Past Medical History:   Diagnosis Date   Acquired hypothyroidism 04/08/2018   Acute retention of urine 04/09/2018   Acute sinusitis 06/05/2020   AKI (acute kidney injury) (HCC) 04/08/2018   Arthralgia of both lower legs    BMI 23.0-23.9, adult 04/21/2020   BPH (benign prostatic hyperplasia)    BPH with urinary obstruction 12/05/2019   CAD (coronary artery disease)    cardiologist--  dr Dulce Sellar w/ [facility] in [place]   CKD (chronic kidney disease) stage 3, GFR 30-59 ml/min (HCC) 07/06/2015   CKD (chronic kidney disease), stage III (HCC)    Coronary artery disease involving coronary bypass graft of native heart 04/09/2015   Overview:  1. DES OM 2012    2. 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 [place];  Service: Urology;  Laterality: N/A;   EXTRACORPOREAL SHOCK WAVE LITHOTRIPSY  1990's   PERCUTANEOUS NEPHROSTOLITHOTOMY  1991   and abdominal hernia repair   URETEROLITHOTOMY  1991 and  1994    Current Medications: Current Meds  Medication Sig   ACCU-CHEK AVIVA PLUS test strip USE TWICE DAILY AS DIRECTED   Accu-Chek FastClix Lancets MISC 1 each by Does not apply route in the morning and at bedtime. Use to check blood sugar twice daily as directed.   Alcohol Swabs (ALCOHOL PREP) 70 % PADS 1 each by Does not apply route daily.   allopurinol (ZYLOPRIM) 100 MG tablet TAKE 1 TABLET EVERY DAY   aspirin EC 81 MG tablet Take 81 mg by mouth daily.   bethanechol (URECHOLINE) 5 MG tablet Take 1 tablet (5 mg total) by mouth 2 (two) times daily.   Blood Glucose Calibration (ACCU-CHEK AVIVA) SOLN 1 each by In Vitro route every 30 (thirty) days.   Cholecalciferol (VITAMIN D-1000 MAX ST) 25 MCG (1000 UT) tablet Take 2,000 Units by mouth.    COLCRYS 0.6 MG tablet Prn gout flares   finasteride (PROSCAR) 5 MG tablet Take 1 tablet (5 mg total) by mouth daily.   levothyroxine (SYNTHROID) 100 MCG tablet TAKE 1 TABLET BY MOUTH ONCE DAILY   LOKELMA 10 g PACK packet Take 10 g by mouth daily.    meclizine (ANTIVERT) 25 MG tablet Take by mouth. Takes OTC prn dizziness.   metFORMIN (GLUCOPHAGE) 500 MG tablet TAKE 1 TABLET TWICE DAILY   metoprolol tartrate (LOPRESSOR) 25 MG tablet TAKE 1 TABLET TWICE DAILY   nitroGLYCERIN (NITROSTAT) 0.4 MG SL tablet Place 0.4 mg under the tongue every 5 (five) minutes as needed for chest pain.   simvastatin (ZOCOR) 40 MG tablet TAKE 1/2 TABLET EVERY DAY   sodium bicarbonate 650 MG tablet Take by mouth 2 (two) times daily.   vitamin B-12 (CYANOCOBALAMIN) 1000 MCG tablet Take 1,000 mcg by mouth daily.     Allergies:   Tamsulosin and Aspirin   Social History   Socioeconomic History   Marital status: Married    Spouse name: Not on file   Number of children: Not on file   Years of education: Not on file   Highest education level: Not on file  Occupational History   Occupation: truck driver  Tobacco Use   Smoking status: Never    Passive exposure: Never    Smokeless tobacco: Never  Vaping Use   Vaping Use: Never used  Substance and Sexual Activity   Alcohol use: No   Drug use: No   Sexual activity: Yes    Partners: Female  Other Topics Concern   Not on file  Social History Narrative   Not on file   Social Determinants of Health   Financial Resource Strain: Not on file  Food Insecurity: Not on file  Transportation Needs: Not on file  Physical Activity: Not on file  Stress: Not on file  Social Connections: Not on file     Family History: The patient's family history includes Heart  disease (age of onset: 43) in his mother; Heart disease (age of onset: 29) in his father; Hypertension in his father. ROS:   Please see the history of present illness.    All other systems reviewed and are negative.  EKGs/Labs/Other Studies Reviewed:    The following studies were reviewed today:  EKG:  EKG ordered today and personally reviewed.  The ekg ordered today demonstrates sinus rhythm incomplete right bundle branch block he is having PVCs and APCs.  Recent Labs: 09/22/2021: ALT 7; BUN 20; Creatinine, Ser 1.60; Hemoglobin 14.4; Platelets 362; Potassium 5.8; Sodium 146; TSH 3.850 GFR 43 cc/min Recent Lipid Panel    Component Value Date/Time   CHOL 134 09/22/2021 0915   TRIG 61 09/22/2021 0915   HDL 48 09/22/2021 0915   CHOLHDL 2.8 09/22/2021 0915   LDLCALC 73 09/22/2021 0915    Physical Exam:    VS:  BP (!) 142/80   Pulse 70   Ht 5\' 8"  (1.727 m)   Wt 166 lb 6.4 oz (75.5 kg)   SpO2 99%   BMI 25.30 kg/m     Wt Readings from Last 3 Encounters:  09/25/21 166 lb 6.4 oz (75.5 kg)  09/22/21 165 lb (74.8 kg)  04/29/21 160 lb 3.2 oz (72.7 kg)     GEN: Looks his age well nourished, well developed in no acute distress HEENT: Normal NECK: No JVD; No carotid bruits LYMPHATICS: No lymphadenopathy CARDIAC: RRR, no murmurs, rubs, gallops RESPIRATORY:  Clear to auscultation without rales, wheezing or rhonchi  ABDOMEN: Soft, non-tender,  non-distended MUSCULOSKELETAL:  No edema; No deformity  SKIN: Warm and dry NEUROLOGIC:  Alert and oriented x 3 PSYCHIATRIC:  Normal affect    Signed, Shirlee More, MD  09/25/2021 10:45 AM    [place]

## 2021-09-25 ENCOUNTER — Other Ambulatory Visit: Payer: Self-pay

## 2021-09-25 ENCOUNTER — Ambulatory Visit: Payer: Medicare PPO | Admitting: Cardiology

## 2021-09-25 ENCOUNTER — Encounter: Payer: Self-pay | Admitting: Cardiology

## 2021-09-25 VITALS — BP 142/80 | HR 70 | Ht 68.0 in | Wt 166.4 lb

## 2021-09-25 DIAGNOSIS — I2510 Atherosclerotic heart disease of native coronary artery without angina pectoris: Secondary | ICD-10-CM | POA: Diagnosis not present

## 2021-09-25 DIAGNOSIS — N1832 Chronic kidney disease, stage 3b: Secondary | ICD-10-CM | POA: Diagnosis not present

## 2021-09-25 DIAGNOSIS — E782 Mixed hyperlipidemia: Secondary | ICD-10-CM

## 2021-09-25 DIAGNOSIS — I951 Orthostatic hypotension: Secondary | ICD-10-CM

## 2021-09-25 DIAGNOSIS — I129 Hypertensive chronic kidney disease with stage 1 through stage 4 chronic kidney disease, or unspecified chronic kidney disease: Secondary | ICD-10-CM | POA: Diagnosis not present

## 2021-09-25 NOTE — Patient Instructions (Signed)

## 2021-09-30 ENCOUNTER — Other Ambulatory Visit: Payer: Self-pay

## 2021-09-30 ENCOUNTER — Encounter: Payer: Self-pay | Admitting: Urology

## 2021-09-30 ENCOUNTER — Telehealth (INDEPENDENT_AMBULATORY_CARE_PROVIDER_SITE_OTHER): Payer: Medicare PPO | Admitting: Urology

## 2021-09-30 DIAGNOSIS — R809 Proteinuria, unspecified: Secondary | ICD-10-CM | POA: Diagnosis not present

## 2021-09-30 DIAGNOSIS — N189 Chronic kidney disease, unspecified: Secondary | ICD-10-CM | POA: Diagnosis not present

## 2021-09-30 DIAGNOSIS — N183 Chronic kidney disease, stage 3 unspecified: Secondary | ICD-10-CM | POA: Diagnosis not present

## 2021-09-30 DIAGNOSIS — I10 Essential (primary) hypertension: Secondary | ICD-10-CM | POA: Diagnosis not present

## 2021-09-30 DIAGNOSIS — N1330 Unspecified hydronephrosis: Secondary | ICD-10-CM | POA: Diagnosis not present

## 2021-09-30 DIAGNOSIS — M109 Gout, unspecified: Secondary | ICD-10-CM | POA: Diagnosis not present

## 2021-09-30 DIAGNOSIS — R309 Painful micturition, unspecified: Secondary | ICD-10-CM | POA: Diagnosis not present

## 2021-09-30 DIAGNOSIS — R339 Retention of urine, unspecified: Secondary | ICD-10-CM | POA: Diagnosis not present

## 2021-09-30 DIAGNOSIS — E039 Hypothyroidism, unspecified: Secondary | ICD-10-CM | POA: Diagnosis not present

## 2021-09-30 DIAGNOSIS — E875 Hyperkalemia: Secondary | ICD-10-CM | POA: Diagnosis not present

## 2021-09-30 DIAGNOSIS — N401 Enlarged prostate with lower urinary tract symptoms: Secondary | ICD-10-CM | POA: Diagnosis not present

## 2021-09-30 DIAGNOSIS — M1000 Idiopathic gout, unspecified site: Secondary | ICD-10-CM | POA: Diagnosis not present

## 2021-09-30 DIAGNOSIS — E1169 Type 2 diabetes mellitus with other specified complication: Secondary | ICD-10-CM | POA: Diagnosis not present

## 2021-09-30 MED ORDER — FINASTERIDE 5 MG PO TABS: 5.0000 mg | ORAL_TABLET | Freq: Every day | ORAL | 3 refills | Status: DC

## 2021-09-30 MED ORDER — BETHANECHOL CHLORIDE 5 MG PO TABS: 5.0000 mg | ORAL_TABLET | Freq: Two times a day (BID) | ORAL | 3 refills | Status: DC

## 2021-09-30 NOTE — Progress Notes (Signed)
09/30/2021 2:31 PM   David Baxter 01-23-1941 [id number]  Referring provider: Abigail Miyamoto, MD [address] [place],  [place] [address]  Patient location: home Physician location: office I connected with  SHAWNDELL Baxter on 09/30/21 by a video enabled telemedicine application and verified that I am speaking with the correct person using two identifiers.   I discussed the limitations of evaluation and management by telemedicine. The patient expressed understanding and agreed to proceed.    Followup BPH and urinary retention  HPI: David Baxter is an 80yo here for followup for BPH with urinary retention. PVR on renal US was 600cc. He has mild bilateral hydronephrosis. He has nocturia 1x. He has urinary frequency every 2-3 hours. No feeling of incomplete emptying.Creatinine 1.5-1.7. No other complaints today   PMH: Past Medical History:  Diagnosis Date   Acquired hypothyroidism 04/08/2018   Acute retention of urine 04/09/2018   Acute sinusitis 06/05/2020   AKI (acute kidney injury) (HCC) 04/08/2018   Arthralgia of both lower legs    BMI 23.0-23.9, adult 04/21/2020   BPH (benign prostatic hyperplasia)    BPH with urinary obstruction 12/05/2019   CAD (coronary artery disease)    cardiologist--  dr Dulce Sellar w/ [facility] in [place]   CKD (chronic kidney disease) stage 3, GFR 30-59 ml/min (HCC) 07/06/2015   CKD (chronic kidney disease), stage III (HCC)    Coronary artery disease involving coronary bypass graft of native heart 04/09/2015   Overview:  1. DES OM 2012    2. 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   SIRS (systemic inflammatory response syndrome) (HCC) 04/08/2018   Type 2 diabetes mellitus (HCC) 04/08/2018   UTI (urinary tract infection) 04/08/2018    Surgical History: Past Surgical History:  Procedure Laterality Date   CORONARY ANGIOPLASTY WITH STENT PLACEMENT  03/04/2015   x2 DES to CFX   CORONARY ANGIOPLASTY WITH STENT PLACEMENT  2012   x1 DES to OM   CYSTOSCOPY WITH INSERTION OF UROLIFT N/A 02/14/2017   Procedure: CYSTOSCOPY WITH INSERTION OF UROLIFT;  Surgeon: Malen Gauze, MD;  Location: [facility];  Service: Urology;  Laterality: N/A;   EXTRACORPOREAL SHOCK WAVE LITHOTRIPSY  1990's   PERCUTANEOUS NEPHROSTOLITHOTOMY  1991   and abdominal hernia repair   URETEROLITHOTOMY  1991 and 1994    Home Medications:  Allergies as of 09/30/2021       Reactions   Tamsulosin Other (See Comments)   Aspirin Other (See Comments)   Upset stomach        Medication List        Accurate as of September 30, 2021  2:31 PM. If you have any questions, ask your nurse or doctor.          Accu-Chek Aviva Plus test strip Generic drug: glucose blood USE TWICE DAILY AS DIRECTED  Accu-Chek Aviva Soln 1 each by In Vitro route every 30 (thirty) days.   Accu-Chek FastClix Lancets Misc 1 each by Does not apply route in the morning and at bedtime. Use to check blood sugar twice daily as directed.   Alcohol Prep 70 % Pads 1 each by Does not apply route daily.   allopurinol 100 MG tablet Commonly known as: ZYLOPRIM TAKE 1 TABLET EVERY DAY   aspirin EC 81 MG tablet Take 81 mg by mouth daily.   bethanechol 5 MG tablet Commonly known as: URECHOLINE Take 1 tablet (5 mg total) by mouth 2 (two) times daily.   Colcrys 0.6 MG tablet Generic drug: colchicine Prn gout flares   finasteride 5 MG  tablet Commonly known as: PROSCAR Take 1 tablet (5 mg total) by mouth daily.   levothyroxine 100 MCG tablet Commonly known as: SYNTHROID TAKE 1 TABLET BY MOUTH ONCE DAILY   Lokelma 10 g Pack packet Generic drug: sodium zirconium cyclosilicate Take 10 g by mouth daily.   meclizine 25 MG tablet Commonly known as: ANTIVERT Take by mouth. Takes OTC prn dizziness.   metFORMIN 500 MG tablet Commonly known as: GLUCOPHAGE TAKE 1 TABLET TWICE DAILY   metoprolol tartrate 25 MG tablet Commonly known as: LOPRESSOR TAKE 1 TABLET TWICE DAILY   nitroGLYCERIN 0.4 MG SL tablet Commonly known as: NITROSTAT Place 0.4 mg under the tongue every 5 (five) minutes as needed for chest pain.   simvastatin 40 MG tablet Commonly known as: ZOCOR TAKE 1/2 TABLET EVERY DAY   sodium bicarbonate 650 MG tablet Take by mouth 2 (two) times daily.   vitamin B-12 1000 MCG tablet Commonly known as: CYANOCOBALAMIN Take 1,000 mcg by mouth daily.   Vitamin D-1000 Max St 25 MCG (1000 UT) tablet Generic drug: Cholecalciferol Take 2,000 Units by mouth.        Allergies:  Allergies  Allergen Reactions   Tamsulosin Other (See Comments)   Aspirin Other (See Comments)    Upset stomach    Family History: Family History  Problem Relation Age of Onset   Heart disease Mother 32       After age 51 - CHF   Hypertension Father    Heart disease Father 68       After age 46 - CHF    Social History:  reports that he has never smoked. He has never been exposed to tobacco smoke. He has never used smokeless tobacco. He reports that he does not drink alcohol and does not use drugs.  ROS: All other review of systems were reviewed and are negative except what is noted above in HPI   Laboratory Data: Lab Results  Component Value Date   WBC 9.5 09/22/2021   HGB 14.4 09/22/2021   HCT 43.5 09/22/2021   MCV 88 09/22/2021   PLT 362 09/22/2021    Lab Results  Component Value Date   CREATININE 1.60 (H)  09/22/2021    No results found for: PSA  No results found for: TESTOSTERONE  Lab Results  Component Value Date   HGBA1C 6.0 (H) 09/22/2021    Urinalysis    Component Value Date/Time   APPEARANCEUR Clear 09/24/2020 1420   GLUCOSEU Negative 09/24/2020 1420   BILIRUBINUR Negative 09/24/2020 1420   PROTEINUR Negative 09/24/2020 1420   NITRITE Negative 09/24/2020 1420   LEUKOCYTESUR Negative 09/24/2020 1420    Lab Results  Component Value Date   LABMICR Comment 09/24/2020    Pertinent Imaging:  No results found for  this or any previous visit.  No results found for this or any previous visit.  No results found for this or any previous visit.  No results found for this or any previous visit.  Results for orders placed during the hospital encounter of 09/23/21  Ultrasound renal complete  Narrative CLINICAL DATA:  Hydronephrosis.  EXAM: RENAL / URINARY TRACT ULTRASOUND COMPLETE  COMPARISON:  Renal ultrasound 03/31/2021.  Fall the  FINDINGS: Right Kidney:  Renal measurements: 10.3 x 5.7 x 4.5 cm = volume: 137 mL. There is increased echogenicity. This is similar to the prior study. There is moderate hydronephrosis. There is a cyst in the inferior pole the right kidney measuring 4.0 x 3.9 x 3.6 cm.  Left Kidney:  Renal measurements: 9.0 x 5.2 x 4.6 cm = volume: 111 mL. There is increased renal echogenicity. There is mild left-sided hydronephrosis. There is a 5 mm calculus in the superior pole.  Bladder:  651 cc postvoid residual.  Other:  None.  IMPRESSION: 1.  Moderate right and mild left hydronephrosis.  2. Increased bilateral renal echogenicity likely related to medical renal disease.  3.  5 mm left renal calculus.  4.  Significant postvoid residual in the bladder.   Electronically Signed By: Darliss Cheney M.D. On: 09/23/2021 21:17  No results found for this or any previous visit.  No results found for this or any previous visit.  No  results found for this or any previous visit.   Assessment & Plan:    1. Benign localized prostatic hyperplasia with lower urinary tract symptoms (LUTS) -continue finasteride   2. Hydronephrosis, unspecified hydronephrosis type -RTC 3 months with renal US  3. Retention, urine Continue bethanecol.   No follow-ups on file.  Wilkie Aye, MD  [facility] [place]

## 2021-09-30 NOTE — Patient Instructions (Signed)

## 2021-11-24 LAB — HM DIABETES EYE EXAM

## 2021-12-17 ENCOUNTER — Telehealth: Payer: Self-pay

## 2021-12-17 NOTE — Chronic Care Management (AMB) (Signed)
12/17/2021- Provider form filled out for refill of Lokelma 10 g to [employer] Patient assistance form, getting provider signature to fax. ? ?Billee Cashing, CMA ?Clinical Pharmacist Assistant ?[phone] ? ?

## 2022-01-01 ENCOUNTER — Encounter: Payer: Self-pay | Admitting: Urology

## 2022-01-01 ENCOUNTER — Ambulatory Visit: Payer: Medicare PPO | Admitting: Urology

## 2022-01-01 ENCOUNTER — Other Ambulatory Visit: Payer: Self-pay

## 2022-01-01 VITALS — BP 154/72 | HR 76

## 2022-01-01 DIAGNOSIS — N401 Enlarged prostate with lower urinary tract symptoms: Secondary | ICD-10-CM | POA: Diagnosis not present

## 2022-01-01 DIAGNOSIS — R339 Retention of urine, unspecified: Secondary | ICD-10-CM | POA: Diagnosis not present

## 2022-01-01 DIAGNOSIS — N1330 Unspecified hydronephrosis: Secondary | ICD-10-CM

## 2022-01-01 LAB — URINALYSIS, ROUTINE W REFLEX MICROSCOPIC
Bilirubin, UA: NEGATIVE
Glucose, UA: NEGATIVE
Ketones, UA: NEGATIVE
Leukocytes,UA: NEGATIVE
Nitrite, UA: NEGATIVE
Protein,UA: NEGATIVE
RBC, UA: NEGATIVE
Specific Gravity, UA: 1.01 (ref 1.005–1.030)
Urobilinogen, Ur: 0.2 mg/dL (ref 0.2–1.0)
pH, UA: 7 (ref 5.0–7.5)

## 2022-01-01 LAB — BLADDER SCAN AMB NON-IMAGING: Scan Result: 886

## 2022-01-01 MED ORDER — FINASTERIDE 5 MG PO TABS: 5.0000 mg | ORAL_TABLET | Freq: Every day | ORAL | 3 refills | Status: DC

## 2022-01-01 MED ORDER — BETHANECHOL CHLORIDE 10 MG PO TABS: 10.0000 mg | ORAL_TABLET | Freq: Two times a day (BID) | ORAL | 3 refills | Status: DC

## 2022-01-01 NOTE — Patient Instructions (Signed)
Acute Urinary Retention, Male °Acute urinary retention is when a person cannot pee (urinate) at all, or can only pee a little. This can come on all of a sudden. If it is not treated, it can lead to kidney problems or other serious problems. °What are the causes? °A problem with the tube that drains the bladder (urethra). °Problems with the nerves in the bladder. °Tumors. °Certain medicines. °An infection. °Having trouble pooping (constipation). °What increases the risk? °Older men are more at risk because their prostate gland may become larger as they age. Other conditions also can increase risk. These include: °Diseases, such as multiple sclerosis. °Injury to the spinal cord. °Diabetes. °A condition that affects the way the brain works, such as dementia. °Holding back urine due to trauma or because you do not want to use the bathroom. °What are the signs or symptoms? °Trouble peeing. °Pain in the lower belly. °How is this treated? °Treatment for this condition may include: °Medicines. °Placing a thin, germ-free tube (catheter) into the bladder to drain pee out of the body. °Therapy to treat mental health conditions. °Treatment for conditions that may cause this. °If needed, you may be treated in the hospital for kidney problems or to manage other problems. °Follow these instructions at home: °Medicines °Take over-the-counter and prescription medicines only as told by your doctor. Ask your doctor what medicines you should stay away from. °If you were given an antibiotic medicine, take it as told by your doctor. Do not stop taking it, even if you start to feel better. °General instructions °Do not smoke or use any products that contain nicotine or tobacco. If you need help quitting, ask your doctor. °Drink enough fluid to keep your pee pale yellow. °If you were sent home with a tube that drains the bladder, take care of it as told by your doctor. °Watch for changes in your symptoms. Tell your doctor about them. °If  told, keep track of changes in your blood pressure at home. Tell your doctor about them. °Keep all follow-up visits. °Contact a doctor if: °You have spasms in your bladder that you cannot stop. °You leak pee when you have spasms. °Get help right away if: °You have chills or a fever. °You have blood in your pee. °You have a tube that drains pee from the bladder and these things happen: °The tube stops draining pee. °The tube falls out. °Summary °Acute urinary retention is when you cannot pee at all or you pee too little. °If this condition is not treated, it can lead to kidney problems or other serious problems. °If you were sent home with a tube (catheter) that drains the bladder, take care of it as told by your doctor. °Watch for changes in your symptoms. Tell your doctor about them. °This information is not intended to replace advice given to you by your health care provider. Make sure you discuss any questions you have with your health care provider. °Document Revised: 06/25/2020 Document Reviewed: 06/25/2020 °Elsevier Patient Education © 2022 [employer] ° °

## 2022-01-01 NOTE — Progress Notes (Signed)
post void residual=[phone] ?

## 2022-01-05 NOTE — Progress Notes (Signed)
01/01/2022 10:36 AM   David Baxter 29-Jun-1941 [id number]  Referring provider: Abigail Miyamoto, MD [address] [place],  [place] [address]  Followup urinary retention and BPH   HPI: David Baxter is a 80yo here for followup for BPH with urinary retention. PVR 886cc today. He is on bethenacol 5mg  BID. IPSS 5 QOL 2. Urine stream is fair. Nocturia 0-1x. No straining to urinate. Creatinine 1.60. He does not want an indwelling foley and he does not want to perform CIC.    PMH: Past Medical History:  Diagnosis Date   Acquired hypothyroidism 04/08/2018   Acute retention of urine 04/09/2018   Acute sinusitis 06/05/2020   AKI (acute kidney injury) (HCC) 04/08/2018   Arthralgia of both lower legs    BMI 23.0-23.9, adult 04/21/2020   BPH (benign prostatic hyperplasia)    BPH with urinary obstruction 12/05/2019   CAD (coronary artery disease)    cardiologist--  dr Dulce Sellar w/ [facility] in [place]   CKD (chronic kidney disease) stage 3, GFR 30-59 ml/min (HCC) 07/06/2015   CKD (chronic kidney disease), stage III (HCC)    Coronary artery disease involving coronary bypass graft of native heart 04/09/2015   Overview:  1. DES OM 2012    2. 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   SIRS (systemic inflammatory response syndrome) (HCC) 04/08/2018   Type 2 diabetes mellitus (HCC) 04/08/2018   UTI (urinary tract infection) 04/08/2018    Surgical History: Past  Surgical History:  Procedure Laterality Date   CORONARY ANGIOPLASTY WITH STENT PLACEMENT  03/04/2015   x2 DES to CFX   CORONARY ANGIOPLASTY WITH STENT PLACEMENT  2012   x1 DES to OM   CYSTOSCOPY WITH INSERTION OF UROLIFT N/A 02/14/2017   Procedure: CYSTOSCOPY WITH INSERTION OF UROLIFT;  Surgeon: Malen Gauze, MD;  [facility];  Service: Urology;  Laterality: N/A;   EXTRACORPOREAL SHOCK WAVE LITHOTRIPSY  1990's   PERCUTANEOUS NEPHROSTOLITHOTOMY  1991   and abdominal hernia repair   URETEROLITHOTOMY  1991 and 1994    Home Medications:  Allergies as of 01/01/2022       Reactions   Tamsulosin Other (See Comments)   Aspirin Other (See Comments)   Upset stomach        Medication List        Accurate as of January 01, 2022 11:59 PM. If you have any questions, ask your nurse or doctor.          Accu-Chek Aviva Plus test strip Generic drug: glucose blood USE TWICE DAILY AS DIRECTED   Accu-Chek Aviva Soln 1 each by In Vitro route every 30 (thirty) days.   Accu-Chek FastClix Lancets Misc 1 each by Does not apply route in the morning and at bedtime. Use to check blood sugar twice daily as directed.   Alcohol Prep 70 %  Pads 1 each by Does not apply route daily.   allopurinol 100 MG tablet Commonly known as: ZYLOPRIM TAKE 1 TABLET EVERY DAY   aspirin EC 81 MG tablet Take 81 mg by mouth daily.   bethanechol 5 MG tablet Commonly known as: URECHOLINE Take 1 tablet (5 mg total) by mouth 2 (two) times daily. What changed: Another medication with the same name was added. Make sure you understand how and when to take each. Changed by: Wilkie Aye, MD   bethanechol 10 MG tablet Commonly known as: URECHOLINE Take 1 tablet (10 mg total) by mouth in the morning and at bedtime. What changed: You were already taking a medication with the same name, and this prescription was added. Make sure you understand how and when to take each. Changed by:  Wilkie Aye, MD   Colcrys 0.6 MG tablet Generic drug: colchicine Prn gout flares   finasteride 5 MG tablet Commonly known as: PROSCAR Take 1 tablet (5 mg total) by mouth daily.   levothyroxine 100 MCG tablet Commonly known as: SYNTHROID TAKE 1 TABLET BY MOUTH ONCE DAILY   Lokelma 10 g Pack packet Generic drug: sodium zirconium cyclosilicate Take 10 g by mouth daily.   meclizine 25 MG tablet Commonly known as: ANTIVERT Take by mouth. Takes OTC prn dizziness.   metFORMIN 500 MG tablet Commonly known as: GLUCOPHAGE TAKE 1 TABLET TWICE DAILY   metoprolol tartrate 25 MG tablet Commonly known as: LOPRESSOR TAKE 1 TABLET TWICE DAILY   nitroGLYCERIN 0.4 MG SL tablet Commonly known as: NITROSTAT Place 0.4 mg under the tongue every 5 (five) minutes as needed for chest pain.   simvastatin 40 MG tablet Commonly known as: ZOCOR TAKE 1/2 TABLET EVERY DAY   sodium bicarbonate 650 MG tablet Take by mouth 2 (two) times daily.   vitamin B-12 1000 MCG tablet Commonly known as: CYANOCOBALAMIN Take 1,000 mcg by mouth daily.   Vitamin D-1000 Max St 25 MCG (1000 UT) tablet Generic drug: Cholecalciferol Take 2,000 Units by mouth.        Allergies:  Allergies  Allergen Reactions   Tamsulosin Other (See Comments)   Aspirin Other (See Comments)    Upset stomach    Family History: Family History  Problem Relation Age of Onset   Heart disease Mother 2       After age 14 - CHF   Hypertension Father    Heart disease Father 55       After age 105 - CHF    Social History:  reports that he has never smoked. He has never been exposed to tobacco smoke. He has never used smokeless tobacco. He reports that he does not drink alcohol and does not use drugs.  ROS: All other review of systems were reviewed and are negative except what is noted above in HPI  Physical Exam: BP (!) 154/72   Pulse 76   Constitutional:  Alert and oriented, No acute distress. HEENT: [place] AT, moist  mucus membranes.  Trachea midline, no masses. Cardiovascular: No clubbing, cyanosis, or edema. Respiratory: Normal respiratory effort, no increased work of breathing. GI: Abdomen is soft, nontender, nondistended, no abdominal masses GU: No CVA tenderness.  Lymph: No cervical or inguinal lymphadenopathy. Skin: No rashes, bruises or suspicious lesions. Neurologic: Grossly intact, no focal deficits, moving all 4 extremities. Psychiatric: Normal mood and affect.  Laboratory Data: Lab Results  Component Value Date   WBC 9.5 09/22/2021   HGB 14.4 09/22/2021   HCT 43.5 09/22/2021  MCV 88 09/22/2021   PLT 362 09/22/2021    Lab Results  Component Value Date   CREATININE 1.60 (H) 09/22/2021    No results found for: PSA  No results found for: TESTOSTERONE  Lab Results  Component Value Date   HGBA1C 6.0 (H) 09/22/2021    Urinalysis    Component Value Date/Time   APPEARANCEUR Clear 01/01/2022 1327   GLUCOSEU Negative 01/01/2022 1327   BILIRUBINUR Negative 01/01/2022 1327   PROTEINUR Negative 01/01/2022 1327   NITRITE Negative 01/01/2022 1327   LEUKOCYTESUR Negative 01/01/2022 1327    Lab Results  Component Value Date   LABMICR Comment 01/01/2022    Pertinent Imaging: Renal US 09/23/2021: Images reviewed and discussed with the patient  No results found for this or any previous visit.  No results found for this or any previous visit.  No results found for this or any previous visit.  No results found for this or any previous visit.  Results for orders placed during the hospital encounter of 09/23/21  Ultrasound renal complete  Narrative CLINICAL DATA:  Hydronephrosis.  EXAM: RENAL / URINARY TRACT ULTRASOUND COMPLETE  COMPARISON:  Renal ultrasound 03/31/2021.  Fall the  FINDINGS: Right Kidney:  Renal measurements: 10.3 x 5.7 x 4.5 cm = volume: 137 mL. There is increased echogenicity. This is similar to the prior study. There is moderate hydronephrosis.  There is a cyst in the inferior pole the right kidney measuring 4.0 x 3.9 x 3.6 cm.  Left Kidney:  Renal measurements: 9.0 x 5.2 x 4.6 cm = volume: 111 mL. There is increased renal echogenicity. There is mild left-sided hydronephrosis. There is a 5 mm calculus in the superior pole.  Bladder:  651 cc postvoid residual.  Other:  None.  IMPRESSION: 1.  Moderate right and mild left hydronephrosis.  2. Increased bilateral renal echogenicity likely related to medical renal disease.  3.  5 mm left renal calculus.  4.  Significant postvoid residual in the bladder.   Electronically Signed By: Darliss Cheney M.D. On: 09/23/2021 21:17  No results found for this or any previous visit.  No results found for this or any previous visit.  No results found for this or any previous visit.   Assessment & Plan:    1. Benign localized prostatic hyperplasia with lower urinary tract symptoms (LUTS) -Increase bethenacol to 10mg  BID - BLADDER SCAN AMB NON-IMAGING - Basic metabolic panel; Future  2. Hydronephrosis, unspecified hydronephrosis type -RTC 3 months with a renal US - Urinalysis, Routine w reflex microscopic - BLADDER SCAN AMB NON-IMAGING - Ultrasound renal complete; Future  3. Retention, urine We discussed the management of his BPH including continued medical therapy, Rezum, Urolift, TURP and simple prostatectomy. After discussing the options the patient has elected to proceed with continued medical therapy.. Risks/benefits/alternatives discussed.    Return in about 8 weeks (around 02/26/2022) for renal US, BMP and PVR.  Wilkie Aye, MD  [facility] [place]

## 2022-01-11 ENCOUNTER — Telehealth: Payer: Self-pay

## 2022-01-11 NOTE — Telephone Encounter (Signed)
I have no information ?lp ?

## 2022-01-11 NOTE — Telephone Encounter (Signed)
Spouse calling for patient. Patient saw in a [employer] that [place] would be helpful with pain in legs. Spouse read bottle and it is recommended to be advised from dr before starting. Please advise if this would be ok to try.  ? ?[name] 01/11/22 2:32 PM ? ?

## 2022-01-12 NOTE — Telephone Encounter (Signed)
He should be seen ?lp ?

## 2022-01-12 NOTE — Telephone Encounter (Signed)
Patient was an appointment 01/26/2022. He will wait until this day. ?

## 2022-01-13 ENCOUNTER — Telehealth: Payer: Self-pay

## 2022-01-13 NOTE — Progress Notes (Signed)
? ? ?Chronic Care Management ?Pharmacy Assistant  ? ?Name: David Baxter  MRN: [id number] DOB: 12/13/1940 ? ? ?Reason for Encounter: Disease State call for DM  ?  ?Recent office visits:  ?09/22/21 Brent Bulla MD. Seen for routine visit. No med changes.  ? ?Recent consult visits:  ?01/01/22 (Urology) Wilkie Aye MD. Seen for Prostatic Hyperplasia. Started Bethanechol Chloride 10mg  2 times daily. ? ?09/30/21 (Urology) 10/02/21 MD. Seen for Prostatic Hyperplasia. No med changes.  ? ?09/30/21 (Nephrology) 10/02/21 MD. Seen for CKD. Ordered Sodium Bicarbonate 650mg . ? ?09/25/21 (Cardiology) MD. Seen for CAD. No med changes.  ? ?Hospital visits:  ?None ? ?Medications: ?Outpatient Encounter Medications as of 01/13/2022  ?Medication Sig  ? ACCU-CHEK AVIVA PLUS test strip USE TWICE DAILY AS DIRECTED  ? Accu-Chek FastClix Lancets MISC 1 each by Does not apply route in the morning and at bedtime. Use to check blood sugar twice daily as directed.  ? Alcohol Swabs (ALCOHOL PREP) 70 % PADS 1 each by Does not apply route daily.  ? allopurinol (ZYLOPRIM) 100 MG tablet TAKE 1 TABLET EVERY DAY  ? aspirin EC 81 MG tablet Take 81 mg by mouth daily.  ? bethanechol (URECHOLINE) 10 MG tablet Take 1 tablet (10 mg total) by mouth in the morning and at bedtime.  ? bethanechol (URECHOLINE) 5 MG tablet Take 1 tablet (5 mg total) by mouth 2 (two) times daily.  ? Blood Glucose Calibration (ACCU-CHEK AVIVA) SOLN 1 each by In Vitro route every 30 (thirty) days.  ? Cholecalciferol (VITAMIN D-1000 MAX ST) 25 MCG (1000 UT) tablet Take 2,000 Units by mouth.   ? COLCRYS 0.6 MG tablet Prn gout flares  ? finasteride (PROSCAR) 5 MG tablet Take 1 tablet (5 mg total) by mouth daily.  ? levothyroxine (SYNTHROID) 100 MCG tablet TAKE 1 TABLET BY MOUTH ONCE DAILY  ? LOKELMA 10 g PACK packet Take 10 g by mouth daily.   ? meclizine (ANTIVERT) 25 MG tablet Take by mouth. Takes OTC prn dizziness.  ? metFORMIN (GLUCOPHAGE) 500  MG tablet TAKE 1 TABLET TWICE DAILY  ? metoprolol tartrate (LOPRESSOR) 25 MG tablet TAKE 1 TABLET TWICE DAILY  ? nitroGLYCERIN (NITROSTAT) 0.4 MG SL tablet Place 0.4 mg under the tongue every 5 (five) minutes as needed for chest pain.  ? simvastatin (ZOCOR) 40 MG tablet TAKE 1/2 TABLET EVERY DAY  ? sodium bicarbonate 650 MG tablet Take by mouth 2 (two) times daily.  ? vitamin B-12 (CYANOCOBALAMIN) 1000 MCG tablet Take 1,000 mcg by mouth daily.  ? ?No facility-administered encounter medications on file as of 01/13/2022.  ? ? ?Recent Relevant Labs: ?Lab Results  ?Component Value Date/Time  ? HGBA1C 6.0 (H) 09/22/2021 09:15 AM  ? HGBA1C 5.9 (H) 04/29/2021 08:54 AM  ? MICROALBUR 80 12/23/2020 09:03 AM  ?  ?Kidney Function ?Lab Results  ?Component Value Date/Time  ? CREATININE 1.60 (H) 09/22/2021 09:15 AM  ? CREATININE 1.75 (H) 05/07/2021 08:59 AM  ? GFRNONAA 43 (L) 08/19/2020 09:32 AM  ? GFRAA 50 (L) 08/19/2020 09:32 AM  ? ? ? ?Current antihyperglycemic regimen:  ?Metformin 500mg  Take 1 twice daily ?Patient verbally confirms he is taking the above medications as directed. Yes ? ?What recent interventions/DTPs have been made to improve glycemic control:  ?Pt denies any changes ? ?Have there been any recent hospitalizations or ED visits since last visit with CPP? No ? ?Patient denies hypoglycemic symptoms, including None ? ?Patient denies hyperglycemic symptoms, including none ? ?  How often are you checking your blood sugar? once daily ? ?What are your blood sugars ranging? (80-114) ?Fasting: 01/13/22 89, 01/12/22 92 ? ?On insulin? No ? ?During the week, how often does your blood glucose drop below 70? Never ? ?Are you checking your feet daily/regularly? Yes ? ?Adherence Review: ?Is the patient currently on a STATIN medication? Yes ?Is the patient currently on ACE/ARB medication? Yes ?Does the patient have >5 day gap between last estimated fill dates? CPP to review ? ?Care Gaps: ?Last eye exam / Retinopathy Screening?  09/29/20 ?Last Annual Wellness Visit? None noted  ?Last Diabetic Foot Exam? 09/22/21 ? ? ?Star Rating Drugs:  ?Medication:  Last Fill: Day Supply ?Metformin   11/24/21  90ds ?   09/08/21 90ds ? ? ?Roxana Hires, CMA ?Clinical Pharmacist Assistant  ?[phone]  ?

## 2022-01-17 IMAGING — US US RENAL
1 series · 14 of 25 positions shown · non-contrast
Comparison: 04/10/2020

CLINICAL DATA: Urinary retention, BPH

EXAM:
RENAL / URINARY TRACT ULTRASOUND COMPLETE

[Series 1: us renal · 14 of 74 slices shown]
[im 1/74]
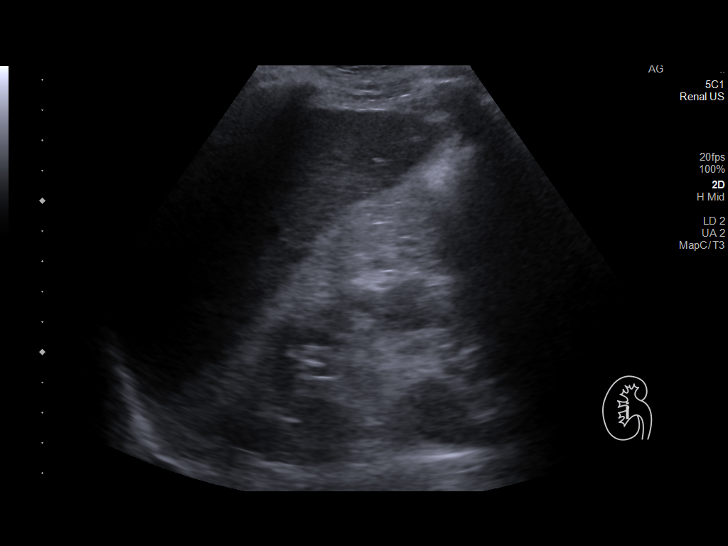
[im 7/74]
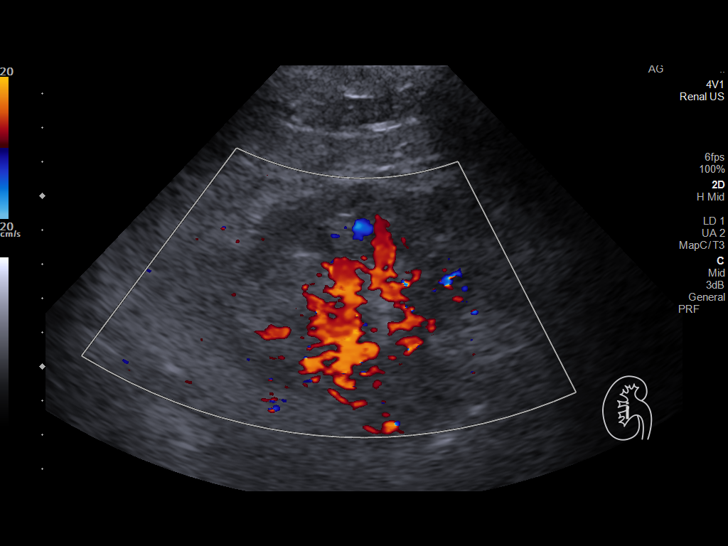
[im 13/74]
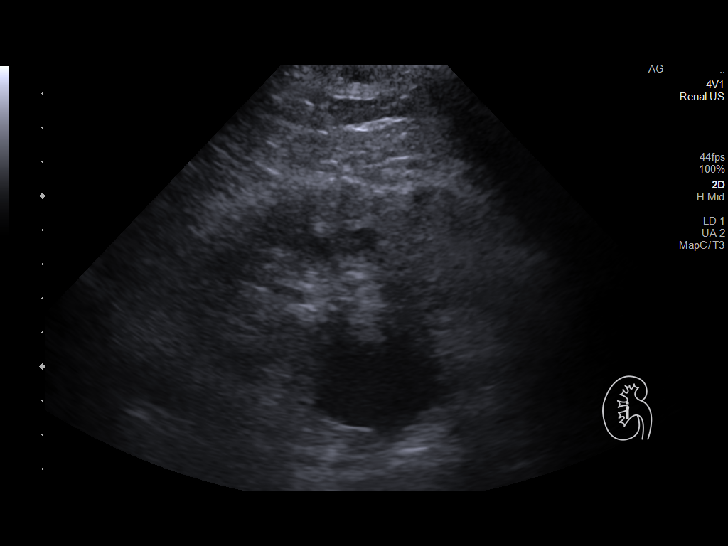
[im 19/74]
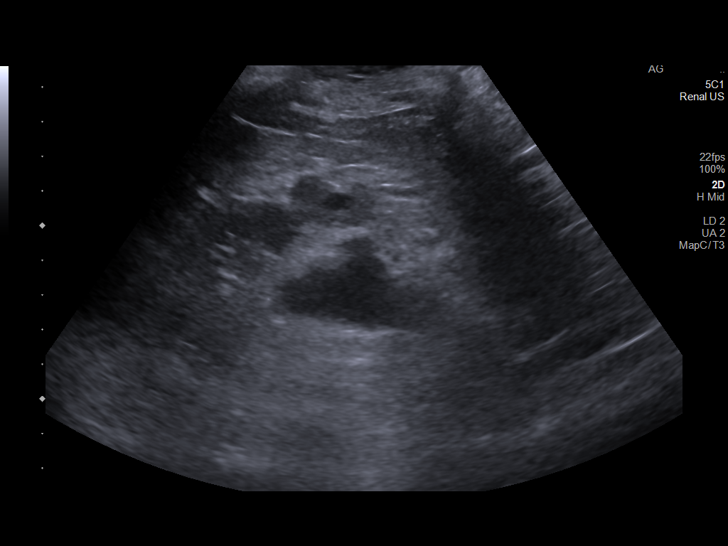
[im 25/74]
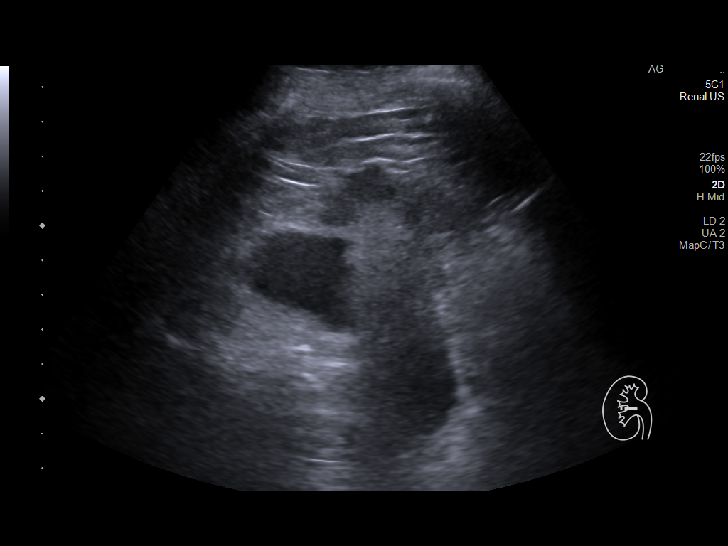
[im 28/74]
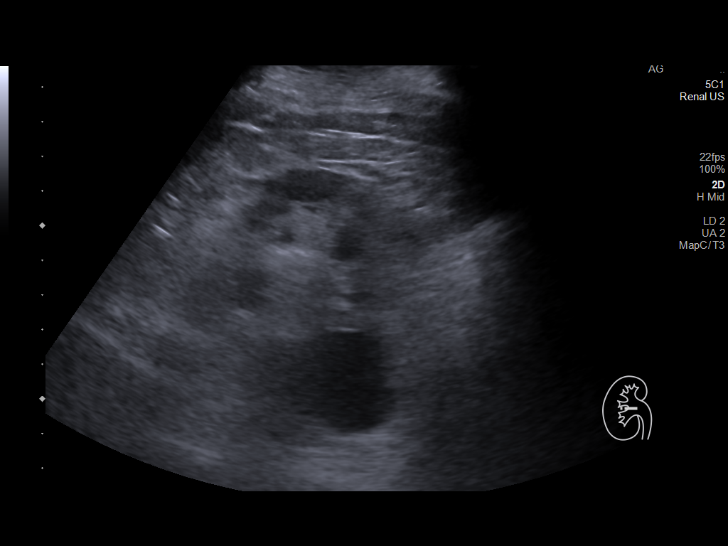
[im 34/74]
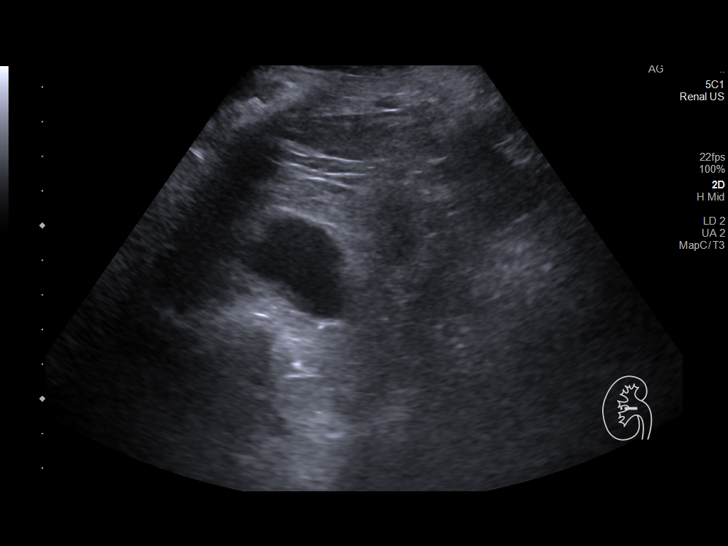
[im 40/74]
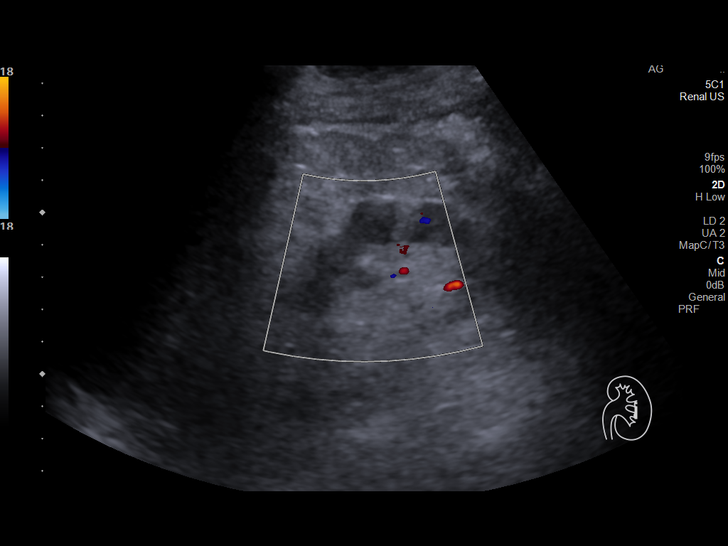
[im 46/74]
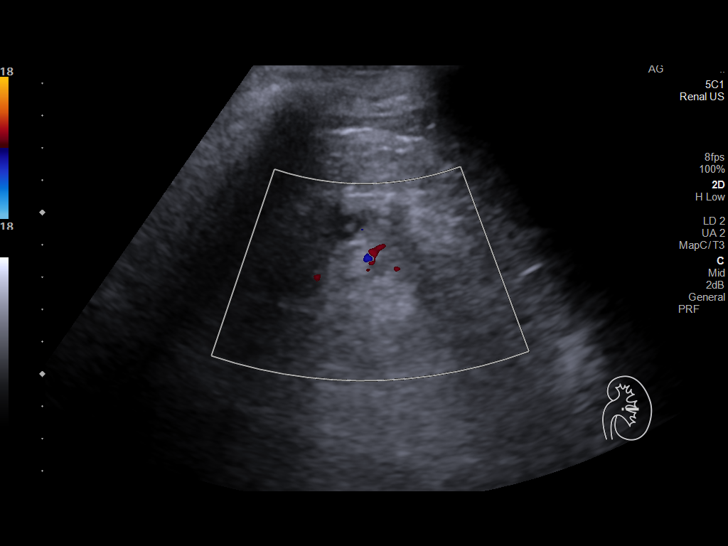
[im 49/74]
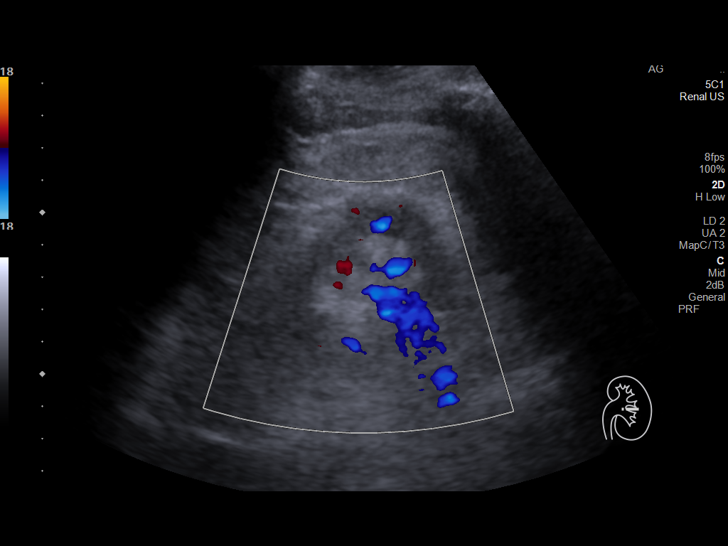
[im 55/74]
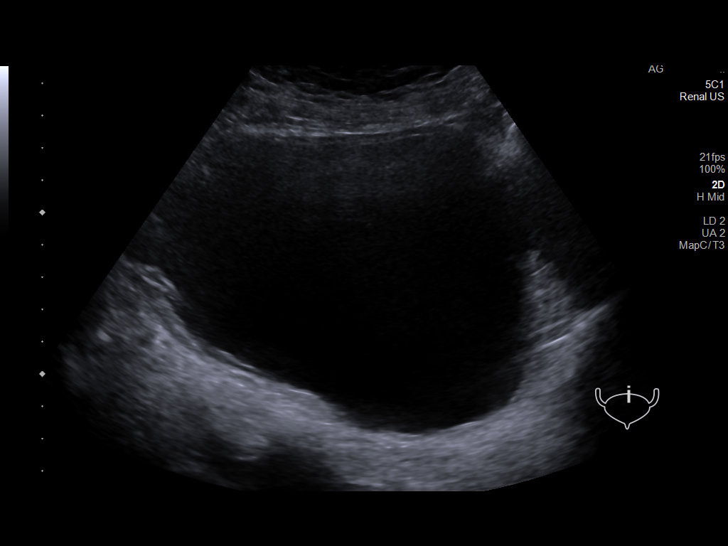
[im 61/74]
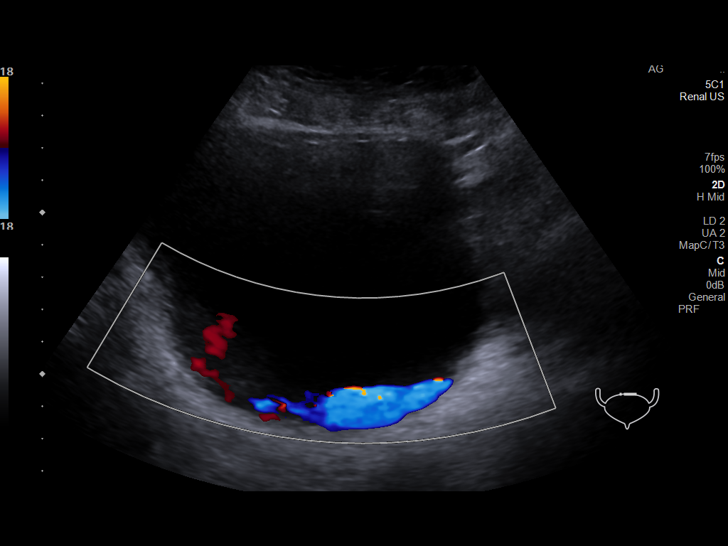
[im 67/74]
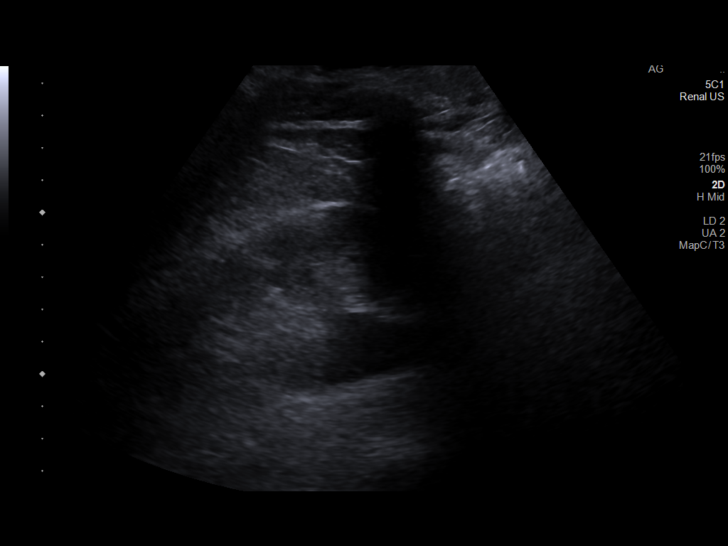
[im 74/74]
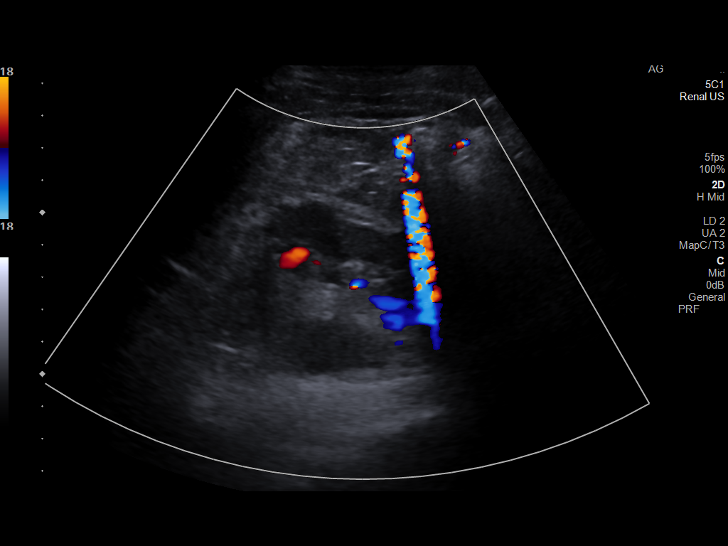

[14 of 25 positions shown; findings below may reference images not displayed]

FINDINGS: Right Kidney:

Renal measurements: 9.6 x 4.9 x 5.2 cm = volume: 126.8 mL. Mild
increased renal cortical echotexture. There is chronic distension of
the right renal pelvis, without frank hydronephrosis. This persists
after voiding. Exophytic cyst off the posterior mid aspect of the
right kidney measures 3.6 x 3.1 x 2.7 cm.

Left Kidney:

Renal measurements: 10.0 x 5.5 x 4.9 cm = volume: 141.9 mL. Mild
increased renal cortical echotexture and renal cortical thinning,
stable. No hydronephrosis or renal mass.

Bladder:

Bladder is moderately distended without filling defect or
irregularity. Prevoid volume measures 640 cc, with postvoid residual
measuring 580 cc.

Other:

None.
IMPRESSION: 1. Distended urinary bladder, with significant postvoid residual,
consistent with history of BPH.
2. Chronic distension of the right renal pelvis without frank
hydronephrosis.
3. Simple right renal cyst.
4. Mild increased renal cortical echotexture bilaterally, consistent
with medical renal disease.

## 2022-01-25 NOTE — Progress Notes (Signed)
? ?Subjective:  ?Patient ID: David Baxter, male    DOB: [birth year]  Age: 81 y.o. MRN: [id number] ? ?Chief Complaint  ?Patient presents with  ? Hyperlipidemia  ? Hypothyroidism  ? Hypertension  ? ? ?HPI: chronic visit ?  ?Patient present with type 2 diabetes. Compliance with treatment has been good; patient take medicines as directed, maintains diet and exercise regimen, follows up as directed, and is keeping glucose diary. Current medicines for diabetes Metformin 500 mg twice a day. Patient performs foot exams daily and last ophthalmologic exam was 09/29/2020. Last A1C 6.0%. ? ?Patient presents with hyperlipidemia.  Compliance with treatment has been good; patient takes medicines as directed, maintains low cholesterol diet, follows up as directed, and maintains exercise regimen.  Patient is using  Simvastatin 40 mg daily without problems.  ? ?Patient presents for follow up of hypertension.  Patient is taking Aspirin 81 mg daily, Metoprolol 25 mg twice a day tolerating well without side effects.  Patient was diagnosed with hypertension and has been treated for hypertension for 12 years.Patient is working on maintaining diet and exercise regimen and follows up as directed. Complication include cad.  ? ?Hypothyroidism: He takes Levothyroxine 100 mcg daily. ? ?Gout: Taking Allopurinol 100 mg daily, Bethanechol 10 mg twice a day. ?Current Outpatient Medications on File Prior to Visit  ?Medication Sig Dispense Refill  ? ACCU-CHEK AVIVA PLUS test strip USE TWICE DAILY AS DIRECTED 200 strip 3  ? Accu-Chek FastClix Lancets MISC 1 each by Does not apply route in the morning and at bedtime. Use to check blood sugar twice daily as directed. 100 each 6  ? Alcohol Swabs (ALCOHOL PREP) 70 % PADS 1 each by Does not apply route daily. 100 each 2  ? allopurinol (ZYLOPRIM) 100 MG tablet TAKE 1 TABLET EVERY DAY 90 tablet 2  ? aspirin EC 81 MG tablet Take 81 mg by mouth daily.    ? bethanechol (URECHOLINE) 10 MG tablet Take 1 tablet  (10 mg total) by mouth in the morning and at bedtime. 180 tablet 3  ? bethanechol (URECHOLINE) 5 MG tablet Take 1 tablet (5 mg total) by mouth 2 (two) times daily. 180 tablet 3  ? Blood Glucose Calibration (ACCU-CHEK AVIVA) SOLN 1 each by In Vitro route every 30 (thirty) days. 1 each 5  ? Cholecalciferol (VITAMIN D-1000 MAX ST) 25 MCG (1000 UT) tablet Take 2,000 Units by mouth.     ? COLCRYS 0.6 MG tablet Prn gout flares    ? finasteride (PROSCAR) 5 MG tablet Take 1 tablet (5 mg total) by mouth daily. 90 tablet 3  ? levothyroxine (SYNTHROID) 100 MCG tablet TAKE 1 TABLET BY MOUTH ONCE DAILY 90 tablet 2  ? LOKELMA 10 g PACK packet Take 10 g by mouth daily.     ? meclizine (ANTIVERT) 25 MG tablet Take by mouth. Takes OTC prn dizziness.    ? metFORMIN (GLUCOPHAGE) 500 MG tablet TAKE 1 TABLET TWICE DAILY 180 tablet 2  ? metoprolol tartrate (LOPRESSOR) 25 MG tablet TAKE 1 TABLET TWICE DAILY 180 tablet 2  ? nitroGLYCERIN (NITROSTAT) 0.4 MG SL tablet Place 0.4 mg under the tongue every 5 (five) minutes as needed for chest pain.    ? simvastatin (ZOCOR) 40 MG tablet TAKE 1/2 TABLET EVERY DAY 45 tablet 2  ? sodium bicarbonate 650 MG tablet Take by mouth 2 (two) times daily.    ? vitamin B-12 (CYANOCOBALAMIN) 1000 MCG tablet Take 1,000 mcg by mouth daily.    ? ?  No current facility-administered medications on file prior to visit.  ? ?Past Medical History:  ?Diagnosis Date  ? Acquired hypothyroidism 04/08/2018  ? Acute retention of urine 04/09/2018  ? Acute sinusitis 06/05/2020  ? AKI (acute kidney injury) ([place]) 04/08/2018  ? Arthralgia of both lower legs   ? BMI 23.0-23.9, adult 04/21/2020  ? BPH (benign prostatic hyperplasia)   ? BPH with urinary obstruction 12/05/2019  ? CAD (coronary artery disease)   ? cardiologist--  dr Bettina Gavia w/ [facility] in [place]  ? CKD (chronic kidney disease) stage 3, GFR 30-59 ml/min (HCC) 07/06/2015  ? CKD (chronic kidney disease), stage III ([place])   ? Coronary artery disease involving coronary  bypass graft of native heart 04/09/2015  ? Overview:  1. DES OM 2012    2. two DES to circumflex coronary artery 03/03/15  ? Diabetic glomerulopathy ([place]) 12/05/2019  ? Essential hypertension 04/09/2015  ? Gout 04/08/2018  ? History of gout   ? History of kidney stones   ? History of stroke   ? per pt/ wife -- unknown when-- told this was on MRI in 2017  ? HOH (hard of hearing)   ? Hyperkalemia 05/09/2020  ? Hyperlipemia, retention 04/09/2015  ? Hyperlipidemia   ? Hypertension   ? Hypothyroidism   ? S/P drug eluting coronary stent placement   ? 2012-- x1DES to OM:  05/ 2016  x2 to CFX  ? Secondary hyperparathyroidism, non-renal ([place]) 12/05/2019  ? SIRS (systemic inflammatory response syndrome) ([place]) 04/08/2018  ? Type 2 diabetes mellitus ([place]) 04/08/2018  ? UTI (urinary tract infection) 04/08/2018  ? ?Past Surgical History:  ?Procedure Laterality Date  ? CORONARY ANGIOPLASTY WITH STENT PLACEMENT  03/04/2015  ? x2 DES to CFX  ? CORONARY ANGIOPLASTY WITH STENT PLACEMENT  2012  ? x1 DES to OM  ? CYSTOSCOPY WITH INSERTION OF UROLIFT N/A 02/14/2017  ? Procedure: CYSTOSCOPY WITH INSERTION OF UROLIFT;  Surgeon: Cleon Gustin, MD;  Location: [facility];  Service: Urology;  Laterality: N/A;  ? EXTRACORPOREAL SHOCK WAVE LITHOTRIPSY  1990's  ? PERCUTANEOUS NEPHROSTOLITHOTOMY  1991  ? and abdominal hernia repair  ? URETEROLITHOTOMY  1991 and 1994  ?  ?Family History  ?Problem Relation Age of Onset  ? Heart disease Mother 53  ?     After age 87 - CHF  ? Hypertension Father   ? Heart disease Father 2  ?     After age 13 - CHF  ? ?Social History  ? ?Socioeconomic History  ? Marital status: Married  ?  Spouse name: Not on file  ? Number of children: Not on file  ? Years of education: Not on file  ? Highest education level: Not on file  ?Occupational History  ? Occupation: truck Geophysicist/field seismologist  ?Tobacco Use  ? Smoking status: Never  ?  Passive exposure: Never  ? Smokeless tobacco: Never  ?Vaping Use  ? Vaping Use: Never used   ?Substance and Sexual Activity  ? Alcohol use: No  ? Drug use: No  ? Sexual activity: Yes  ?  Partners: Female  ?Other Topics Concern  ? Not on file  ?Social History Narrative  ? Not on file  ? ?Social Determinants of Health  ? ?Financial Resource Strain: Not on file  ?Food Insecurity: Not on file  ?Transportation Needs: Not on file  ?Physical Activity: Not on file  ?Stress: Not on file  ?Social Connections: Not on file  ? ? ?Review of Systems  ?  Constitutional:  Negative for chills, fatigue, fever and unexpected weight change.  ?HENT:  Negative for congestion, rhinorrhea, sinus pressure, sneezing and sore throat.   ?Eyes:  Negative for discharge and visual disturbance.  ?Respiratory:  Negative for cough, shortness of breath and wheezing.   ?Cardiovascular:  Negative for chest pain and palpitations.  ?Gastrointestinal:  Negative for abdominal pain, diarrhea, nausea and vomiting.  ?Endocrine: Negative for polydipsia, polyphagia and polyuria.  ?Genitourinary:  Negative for decreased urine volume, difficulty urinating, dysuria, frequency, penile swelling and urgency.  ?Musculoskeletal:  Negative for back pain, gait problem, joint swelling, neck pain and neck stiffness.  ?Skin: Negative.   ?Neurological:  Negative for dizziness, seizures, weakness, numbness and headaches.  ?Psychiatric/Behavioral:  Negative for confusion, hallucinations, sleep disturbance and suicidal ideas. The patient is not nervous/anxious and is not hyperactive.   ? ? ?Objective:  ?BP 136/80   Pulse 74   Temp (!) 96.7 ?F (35.9 ?C)   Resp 16   Ht 5\' 8"  (1.727 m)   Wt 169 lb 3.2 oz (76.7 kg)   BMI 25.73 kg/m?  ? ? ?  01/26/2022  ?  8:01 AM 01/01/2022  ? 12:05 PM 09/25/2021  ? 10:44 AM  ?BP/Weight  ?Systolic BP [id number]  ?Diastolic BP 80 72 80  ?Wt. (Lbs) 169.2    ?BMI 25.73 kg/m2    ? ? ?Physical Exam ?Vitals reviewed.  ?Constitutional:   ?   General: He is not in acute distress. ?   Appearance: Normal appearance.  ?HENT:  ?   Head:  Normocephalic.  ?   Right Ear: Tympanic membrane normal.  ?   Left Ear: Tympanic membrane normal.  ?   Nose: Nose normal.  ?Eyes:  ?   Extraocular Movements: Extraocular movements intact.  ?   Conjunctiva/sclera: Conjunctivae

## 2022-01-26 ENCOUNTER — Encounter: Payer: Self-pay | Admitting: Legal Medicine

## 2022-01-26 ENCOUNTER — Ambulatory Visit (INDEPENDENT_AMBULATORY_CARE_PROVIDER_SITE_OTHER): Payer: Medicare PPO | Admitting: Legal Medicine

## 2022-01-26 VITALS — BP 136/80 | HR 74 | Temp 96.7°F | Resp 16 | Ht 68.0 in | Wt 169.2 lb

## 2022-01-26 DIAGNOSIS — M1A00X0 Idiopathic chronic gout, unspecified site, without tophus (tophi): Secondary | ICD-10-CM | POA: Diagnosis not present

## 2022-01-26 DIAGNOSIS — I739 Peripheral vascular disease, unspecified: Secondary | ICD-10-CM

## 2022-01-26 DIAGNOSIS — E1121 Type 2 diabetes mellitus with diabetic nephropathy: Secondary | ICD-10-CM | POA: Diagnosis not present

## 2022-01-26 DIAGNOSIS — I25708 Atherosclerosis of coronary artery bypass graft(s), unspecified, with other forms of angina pectoris: Secondary | ICD-10-CM | POA: Diagnosis not present

## 2022-01-26 DIAGNOSIS — R350 Frequency of micturition: Secondary | ICD-10-CM

## 2022-01-26 DIAGNOSIS — E782 Mixed hyperlipidemia: Secondary | ICD-10-CM | POA: Diagnosis not present

## 2022-01-26 DIAGNOSIS — E039 Hypothyroidism, unspecified: Secondary | ICD-10-CM | POA: Diagnosis not present

## 2022-01-26 DIAGNOSIS — N183 Chronic kidney disease, stage 3 unspecified: Secondary | ICD-10-CM | POA: Diagnosis not present

## 2022-01-26 DIAGNOSIS — I10 Essential (primary) hypertension: Secondary | ICD-10-CM

## 2022-01-26 DIAGNOSIS — N401 Enlarged prostate with lower urinary tract symptoms: Secondary | ICD-10-CM

## 2022-01-26 DIAGNOSIS — G6289 Other specified polyneuropathies: Secondary | ICD-10-CM

## 2022-01-26 DIAGNOSIS — Z955 Presence of coronary angioplasty implant and graft: Secondary | ICD-10-CM

## 2022-01-26 MED ORDER — GABAPENTIN 100 MG PO CAPS: 100.0000 mg | ORAL_CAPSULE | Freq: Three times a day (TID) | ORAL | 3 refills | Status: DC

## 2022-01-28 ENCOUNTER — Ambulatory Visit (INDEPENDENT_AMBULATORY_CARE_PROVIDER_SITE_OTHER): Payer: Medicare PPO

## 2022-01-28 DIAGNOSIS — E039 Hypothyroidism, unspecified: Secondary | ICD-10-CM

## 2022-01-28 DIAGNOSIS — E782 Mixed hyperlipidemia: Secondary | ICD-10-CM

## 2022-01-28 DIAGNOSIS — N183 Chronic kidney disease, stage 3 unspecified: Secondary | ICD-10-CM

## 2022-01-28 NOTE — Progress Notes (Signed)
? ?Chronic Care Management ?Pharmacy Note ? ?01/28/2022 ?Name:  David Baxter MRN:  [id number] DOB:  1941/08/05 ? ?Plan Updates:  ?None ? ?Subjective: ?David Baxter is an 81 y.o. year old male who is a primary patient of Henrene Pastor, Zeb Comfort, MD.  The CCM team was consulted for assistance with disease management and care coordination needs.   ? ?Engaged with patient by telephone for follow up visit in response to provider referral for pharmacy case management and/or care coordination services.  ? ?Consent to Services:  ?The patient was given information about Chronic Care Management services, agreed to services, and gave verbal consent prior to initiation of services.  Please see initial visit note for detailed documentation.  ? ?Patient Care Team: ?Lillard Anes, MD as PCP - General (Family Medicine) ?Lane Hacker, [facility] (Pharmacist) ? ?Recent office visits:  ?09/22/21 [name] MD. Seen for routine visit. No med changes.  ?  ?Recent consult visits:  ?01/01/22 (Urology) Nicolette Bang MD. Seen for Prostatic Hyperplasia. Started Bethanechol Chloride 32m 2 times daily. ?  ?09/30/21 (Urology) MNicolette BangMD. Seen for Prostatic Hyperplasia. No med changes.  ?  ?09/30/21 (Nephrology) AGifford ShaveMD. Seen for CKD. Ordered Sodium Bicarbonate 658m ?  ?09/25/21 (Cardiology) MuShirlee MoreD. Seen for CAD. No med changes.  ?  ?Hospital visits:  ?None ? ?Objective: ? ?Lab Results  ?Component Value Date  ? CREATININE 1.60 (H) 09/22/2021  ? BUN 20 09/22/2021  ? GFRNONAA 43 (L) 08/19/2020  ? GFRAA 50 (L) 08/19/2020  ? NA 146 (H) 09/22/2021  ? K 5.8 (H) 09/22/2021  ? CALCIUM 9.1 09/22/2021  ? CO2 26 09/22/2021  ? ? ?Lab Results  ?Component Value Date/Time  ? HGBA1C 6.0 (H) 09/22/2021 09:15 AM  ? HGBA1C 5.9 (H) 04/29/2021 08:54 AM  ? MICROALBUR 80 12/23/2020 09:03 AM  ?  ?Last diabetic Eye exam:  ?Lab Results  ?Component Value Date/Time  ? HMDIABEYEEXA No Retinopathy 09/29/2020 12:00 AM  ?   ?Last diabetic Foot exam: No results found for: HMDIABFOOTEX  ? ?Lab Results  ?Component Value Date  ? CHOL 134 09/22/2021  ? HDL 48 09/22/2021  ? [place]3 09/22/2021  ? TRIG 61 09/22/2021  ? CHOLHDL 2.8 09/22/2021  ? ? ? ?  Latest Ref Rng & Units 09/22/2021  ?  9:15 AM 05/07/2021  ?  8:59 AM 04/29/2021  ?  8:54 AM  ?Hepatic Function  ?Total Protein 6.0 - 8.5 g/dL 6.6   6.4   6.3    ?Albumin 3.6 - 4.6 g/dL 4.2   4.2   4.2    ?AST 0 - 40 IU/L '13   14   17    ' ?ALT 0 - 44 IU/L '7   10   14    ' ?Alk Phosphatase 44 - 121 IU/L 82   92   93    ?Total Bilirubin 0.0 - 1.2 mg/dL 0.3   0.4   0.5    ? ? ?Lab Results  ?Component Value Date/Time  ? TSH 3.850 09/22/2021 09:15 AM  ? TSH 2.690 08/19/2020 09:32 AM  ? ? ? ?  Latest Ref Rng & Units 09/22/2021  ?  9:15 AM 04/29/2021  ?  8:54 AM 12/23/2020  ?  9:02 AM  ?CBC  ?WBC 3.4 - 10.8 x10E3/uL 9.5   9.0   10.9    ?Hemoglobin 13.0 - 17.7 g/dL 14.4   14.4   14.3    ?Hematocrit 37.5 - 51.0 %  43.5   44.7   44.1    ?Platelets 150 - 450 x10E3/uL 362   333   318    ? ? ?No results found for: VD25OH ? ?Clinical ASCVD: Yes  ?The ASCVD Risk score (Arnett DK, et al., 2019) failed to calculate for the following reasons: ?  The 2019 ASCVD risk score is only valid for ages 58 to 74   ? ? ?  01/26/2022  ?  8:08 AM 09/22/2021  ?  8:24 AM 05/11/2020  ?  7:00 PM  ?Depression screen PHQ 2/9  ?Decreased Interest 0 0 0  ?Down, Depressed, Hopeless 0 0 0  ?PHQ - 2 Score 0 0 0  ?  ? ?Social History  ? ?Tobacco Use  ?Smoking Status Never  ? Passive exposure: Never  ?Smokeless Tobacco Never  ? ?BP Readings from Last 3 Encounters:  ?01/26/22 136/80  ?01/01/22 (!) 154/72  ?09/25/21 (!) 142/80  ? ?Pulse Readings from Last 3 Encounters:  ?01/26/22 74  ?01/01/22 76  ?09/25/21 70  ? ?Wt Readings from Last 3 Encounters:  ?01/26/22 169 lb 3.2 oz (76.7 kg)  ?09/25/21 166 lb 6.4 oz (75.5 kg)  ?09/22/21 165 lb (74.8 kg)  ? ? ?Assessment/Interventions: Review of patient past medical history, allergies, medications, health  status, including review of consultants reports, laboratory and other test data, was performed as part of comprehensive evaluation and provision of chronic care management services.  ? ?SDOH:  (Social Determinants of Health) assessments and interventions performed: Yes ?SDOH Interventions   ? ?Flowsheet Row Most Recent Value  ?SDOH Interventions   ?Financial Strain Interventions Other (Comment)  [Lokelma PAP]  ?Transportation Interventions Intervention Not Indicated  ? ?  ? ? ?[place] ? ?Allergies  ?Allergen Reactions  ? Tamsulosin Other (See Comments)  ? Aspirin Other (See Comments)  ?  Upset stomach  ? ? ?Medications Reviewed Today   ? ? Reviewed by Lillard Anes, MD (Physician) on 01/26/22 at [phone]  Med List Status: <None>  ? ?Medication Order Taking? Sig Documenting Provider Last Dose Status Informant  ?ACCU-CHEK AVIVA PLUS test strip [id number] Yes USE TWICE DAILY AS DIRECTED Lillard Anes, MD Taking Active   ?Accu-Chek FastClix Lancets MISC [id number] Yes 1 each by Does not apply route in the morning and at bedtime. Use to check blood sugar twice daily as directed. Lillard Anes, MD Taking Active   ?Alcohol Swabs (ALCOHOL PREP) 70 % PADS [id number] Yes 1 each by Does not apply route daily. Lillard Anes, MD Taking Active   ?allopurinol (ZYLOPRIM) 100 MG tablet [id number] Yes TAKE 1 TABLET EVERY DAY Lillard Anes, MD Taking Active   ?aspirin EC 81 MG tablet [id number] Yes Take 81 mg by mouth daily. [provider] Taking Active   ?bethanechol (URECHOLINE) 10 MG tablet [id number] Yes Take 1 tablet (10 mg total) by mouth in the morning and at bedtime. McKenzie, Candee Furbish, MD Taking Active   ?bethanechol (URECHOLINE) 5 MG tablet [id number] Yes Take 1 tablet (5 mg total) by mouth 2 (two) times daily. McKenzie, Candee Furbish, MD Taking Active   ?Blood Glucose Calibration (ACCU-CHEK AVIVA) SOLN [id number] Yes 1 each by In Vitro route every 30 (thirty) days. Lillard Anes, MD Taking Active   ?Cholecalciferol (VITAMIN D-1000 MAX ST) 25 MCG (1000 UT) tablet [id number] Yes Take 2,000 Units by mouth.  [provider] Taking Active   ?COLCRYS 0.6 MG tablet [id number] Yes Prn gout flares [provider] Taking  Active Self  ?finasteride (PROSCAR) 5 MG tablet [id number] Yes Take 1 tablet (5 mg total) by mouth daily. Cleon Gustin, MD Taking Active   ?levothyroxine (SYNTHROID) 100 MCG tablet [id number] Yes TAKE 1 TABLET BY MOUTH ONCE DAILY Lillard Anes, MD Taking Active   ?LOKELMA 10 g PACK packet [id number] Yes Take 10 g by mouth daily.  [provider] Taking Active Self  ?meclizine (ANTIVERT) 25 MG tablet [id number] Yes Take by mouth. Takes OTC prn dizziness. [provider] Taking Active Self  ?metFORMIN (GLUCOPHAGE) 500 MG tablet [id number] Yes TAKE 1 TABLET TWICE DAILY Lillard Anes, MD Taking Active   ?metoprolol tartrate (LOPRESSOR) 25 MG tablet [id number] Yes TAKE 1 TABLET TWICE DAILY Lillard Anes, MD Taking Active   ?nitroGLYCERIN (NITROSTAT) 0.4 MG SL tablet [id number] Yes Place 0.4 mg under the tongue every 5 (five) minutes as needed for chest pain. [provider] Taking Active   ?simvastatin (ZOCOR) 40 MG tablet [id number] Yes TAKE 1/2 TABLET EVERY DAY Lillard Anes, MD Taking Active   ?sodium bicarbonate 650 MG tablet [id number] Yes Take by mouth 2 (two) times daily. [provider] Taking Active   ?vitamin B-12 (CYANOCOBALAMIN) 1000 MCG tablet [id number] Yes Take 1,000 mcg by mouth daily. [provider] Taking Active   ? ?  ?  ? ?  ? ? ?Patient Active Problem List  ? Diagnosis Date Noted  ? Malnutrition of mild degree ([place]) 04/29/2021  ? Hydronephrosis 03/31/2021  ? Hearing loss 09/23/2020  ? H/O: gout 09/23/2020  ? Type 2 diabetes mellitus with other specified complication ([place]) 30/16/0109  ? Personal history of urinary calculi 09/23/2020  ? History of  cerebrovascular accident 09/23/2020  ? Presence of coronary angioplasty implant and graft 09/23/2020  ? S/P drug eluting coronary stent placement   ? Hyperlipidemia   ? Arthralgia of both lower legs   ? Hyperkalemia 05/09/2020

## 2022-01-28 NOTE — Patient Instructions (Signed)
Visit Information ? ? Goals Addressed   ?None ?  ? ?Patient Care Plan: David Baxter  ?  ? ?Problem Identified: dm, htn, hld, cad   ?Priority: High  ?Onset Date: 12/12/2020  ?  ? ?Long-Range Goal: Disease Management   ?Start Date: 12/12/2020  ?Expected End Date: 12/12/2021  ?Recent Progress: On track  ?Priority: High  ?Note:   ?Current Barriers:  ?Unable to independently afford treatment regimen ? ?Pharmacist Clinical Goal(s):  ?Over the next 90 days, patient will verbalize ability to afford treatment regimen ?maintain control of diabetes as evidenced by a1c  through collaboration with PharmD and provider.  ? ?Interventions: ?1:1 collaboration with Lillard Anes, MD regarding development and update of comprehensive plan of care as evidenced by provider attestation and co-signature ?Inter-disciplinary care team collaboration (see longitudinal plan of care) ?Comprehensive medication review performed; medication list updated in electronic medical record ? ?Hypertension (BP goal <130/80) ?-Controlled ?-Current treatment: ?Metoprolol tartrate 25 mg bid Appropriate, Effective, Safe, Accessible ?-Medications previously tried: none noted ?-Current home readings: 120-130/70s ?-Current dietary habits: low potassium diet. Trying to increase intake to avoid additional weight loss.  ?-Current exercise habits: stays active around his house and walking daughter's dog ?-Denies hypotensive/hypertensive symptoms ?-Educated on BP goals and benefits of medications for prevention of heart attack, stroke and kidney damage; ?Daily salt intake goal < 2300 mg; ?Exercise goal of 150 minutes per week; ?Importance of home blood pressure monitoring; ?-Counseled to monitor BP at home weekly, document, and provide log at future appointments ?-Counseled on diet and exercise extensively ?Recommended to continue current medication ? ?Hyperlipidemia: (LDL goal < 55) ?The ASCVD Risk score (Arnett DK, et al., 2019) failed to calculate  for the following reasons: ?  The 2019 ASCVD risk score is only valid for ages 22 to 62 ?Lab Results  ?Component Value Date  ? CHOL 134 09/22/2021  ? CHOL 119 04/29/2021  ? CHOL 127 12/23/2020  ? ?Lab Results  ?Component Value Date  ? HDL 48 09/22/2021  ? HDL 37 (L) 04/29/2021  ? HDL 45 12/23/2020  ? ?Lab Results  ?Component Value Date  ? [place] 73 09/22/2021  ? [place] 61 04/29/2021  ? [place] 65 12/23/2020  ? ?Lab Results  ?Component Value Date  ? TRIG 61 09/22/2021  ? TRIG 112 04/29/2021  ? TRIG 88 12/23/2020  ? ?Lab Results  ?Component Value Date  ? CHOLHDL 2.8 09/22/2021  ? CHOLHDL 3.2 04/29/2021  ? CHOLHDL 2.8 12/23/2020  ?No results found for: LDLDIRECT ?-Not ideally controlled ?-Current treatment: ?Simvastatin 40 mg - 1/2 tablet daily Query Appropriate,  ?-Medications previously tried: none reported  ?-Current dietary patterns: low potassium diet.  ?-Current exercise habits: travels frequently to visit family. Works around his home and walks with dog..  ?-Educated on Cholesterol goals;  ?Benefits of statin for ASCVD risk reduction; ?Importance of limiting foods high in cholesterol; ?Exercise goal of 150 minutes per week; ?-Counseled on diet and exercise extensively ?April 2023: Will ask PCP to send in Simvastatin 15m QD ? ?Diabetes (A1c goal <7%) ?Lab Results  ?Component Value Date  ? HGBA1C 6.0 (H) 09/22/2021  ? HGBA1C 5.9 (H) 04/29/2021  ? HGBA1C 5.8 (H) 12/23/2020  ? ?Lab Results  ?Component Value Date  ? MICROALBUR 80 12/23/2020  ? [place]73 09/22/2021  ? CREATININE 1.60 (H) 09/22/2021  ? ?Lab Results  ?Component Value Date  ? NA 146 (H) 09/22/2021  ? K 5.8 (H) 09/22/2021  ? CREATININE 1.60 (H) 09/22/2021  ?  EGFR 43 (L) 09/22/2021  ? GFRNONAA 43 (L) 08/19/2020  ? GLUCOSE 117 (H) 09/22/2021  ? ?Lab Results  ?Component Value Date  ? WBC 9.5 09/22/2021  ? HGB 14.4 09/22/2021  ? HCT 43.5 09/22/2021  ? MCV 88 09/22/2021  ? PLT 362 09/22/2021  ?-Controlled ?-Current medications: ?Metformin 500 mg bid  Appropriate, Effective, Safe, Accessible ?-Medications previously tried: none reported  ?-Current home glucose readings ?fasting glucose: 90-115 mg/dL ?post prandial glucose: none reported ?-Denies hypoglycemic/hyperglycemic symptoms ?-Current meal patterns:  ?Eats a variety of fruits and vegetables. Limits potassium in diet due to kidney. Trying to increase protein.  ?-Current exercise: stays active around home.  ?-Educated onA1c and blood sugar goals; ?Complications of diabetes including kidney damage, retinal damage, and cardiovascular disease; ?Exercise goal of 150 minutes per week; ?Benefits of routine self-monitoring of blood sugar; ?-Counseled to check feet daily and get yearly eye exams ?-Counseled on diet and exercise extensively ?-No ACE/ARB due to K+ ?Recommended to continue current medication ? ?Hypothyroidism (Goal: manage TSH) ?Lab Results  ?Component Value Date  ? TSH 3.850 09/22/2021  ?-Controlled ?-Current treatment  ?Levothyroxine 100 mcg daily Appropriate, Effective, Safe, Accessible ?-Medications previously tried: none reported  ?-Recommended to continue current medication ? ?Chronic Kidney Disease Stage 3B  ?-Managed by Dr. Alyson Ingles ?Lab Results  ?Component Value Date  ? CREATININE 1.60 (H) 09/22/2021  ? BUN 20 09/22/2021  ? NA 146 (H) 09/22/2021  ? K 5.8 (H) 09/22/2021  ? CL 105 09/22/2021  ? CO2 26 09/22/2021  ? ?Lab Results  ?Component Value Date  ? NA 146 (H) 09/22/2021  ? K 5.8 (H) 09/22/2021  ? CREATININE 1.60 (H) 09/22/2021  ? EGFR 43 (L) 09/22/2021  ? GFRNONAA 43 (L) 08/19/2020  ? GLUCOSE 117 (H) 09/22/2021  ?-Current treatment  ?Lokelma 10g QD (PAP) Appropriate, Effective, Safe, Accessible ?Sodium Bicarbonate 631m BID Appropriate, Effective, Safe, Accessible ?-Recommended to continue current medication ? ? ?Patient Goals/Self-Care Activities ?Over the next 90 days, patient will:  ?- take medications as prescribed ?focus on medication adherence by using pill box ?check glucose daily ,  document, and provide at future appointments ?check blood pressure weekly, document, and provide at future appointments ? ?Follow Up Plan: Telephone follow up appointment with care management team member scheduled for: 08/2022 ? ?NArizona Constable Pharm.D. - [phone] ? ?  ? ? ?Mr. BSchielewas given information about Chronic Care Management services today including:  ?CCM service includes personalized support from designated clinical staff supervised by his physician, including individualized plan of care and coordination with other care providers ?24/7 contact phone numbers for assistance for urgent and routine care needs. ?Standard insurance, coinsurance, copays and deductibles apply for chronic care management only during months in which we provide at least 20 minutes of these services. Most insurances cover these services at 100%, however patients may be responsible for any copay, coinsurance and/or deductible if applicable. This service may help you avoid the need for more expensive face-to-face services. ?Only one practitioner may furnish and bill the service in a calendar month. ?The patient may stop CCM services at any time (effective at the end of the month) by phone call to the office staff. ? ?Patient agreed to services and verbal consent obtained.  ? ?The patient verbalized understanding of instructions, educational materials, and care plan provided today and declined offer to receive copy of patient instructions, educational materials, and care plan.  ?The pharmacy team will reach out to the patient again over the next 60  days.  ? ?Lane Hacker, [place] ? ?

## 2022-01-29 LAB — CBC WITH DIFFERENTIAL/PLATELET
Basophils Absolute: 0.2 10*3/uL (ref 0.0–0.2)
Basos: 2 %
EOS (ABSOLUTE): 0.9 10*3/uL — ABNORMAL HIGH (ref 0.0–0.4)
Eos: 10 %
Hematocrit: 42.8 % (ref 37.5–51.0)
Hemoglobin: 13.8 g/dL (ref 13.0–17.7)
Immature Grans (Abs): 0 10*3/uL (ref 0.0–0.1)
Immature Granulocytes: 0 %
Lymphocytes Absolute: 2.9 10*3/uL (ref 0.7–3.1)
Lymphs: 31 %
MCH: 29.2 pg (ref 26.6–33.0)
MCHC: 32.2 g/dL (ref 31.5–35.7)
MCV: 91 fL (ref 79–97)
Monocytes Absolute: 1 10*3/uL — ABNORMAL HIGH (ref 0.1–0.9)
Monocytes: 11 %
Neutrophils Absolute: 4.3 10*3/uL (ref 1.4–7.0)
Neutrophils: 46 %
Platelets: 327 10*3/uL (ref 150–450)
RBC: 4.72 x10E6/uL (ref 4.14–5.80)
RDW: 14.6 % (ref 11.6–15.4)
WBC: 9.1 10*3/uL (ref 3.4–10.8)

## 2022-01-29 LAB — CARDIOVASCULAR RISK ASSESSMENT

## 2022-01-29 LAB — COMPREHENSIVE METABOLIC PANEL
ALT: 7 IU/L (ref 0–44)
AST: 14 IU/L (ref 0–40)
Albumin/Globulin Ratio: 1.3 (ref 1.2–2.2)
Albumin: 3.8 g/dL (ref 3.6–4.6)
Alkaline Phosphatase: 75 IU/L (ref 44–121)
BUN/Creatinine Ratio: 13 (ref 10–24)
BUN: 21 mg/dL (ref 8–27)
Bilirubin Total: 0.3 mg/dL (ref 0.0–1.2)
CO2: 25 mmol/L (ref 20–29)
Calcium: 9.3 mg/dL (ref 8.6–10.2)
Chloride: 105 mmol/L (ref 96–106)
Creatinine, Ser: 1.62 mg/dL — ABNORMAL HIGH (ref 0.76–1.27)
Globulin, Total: 2.9 g/dL (ref 1.5–4.5)
Glucose: 100 mg/dL — ABNORMAL HIGH (ref 70–99)
Potassium: 5.3 mmol/L — ABNORMAL HIGH (ref 3.5–5.2)
Sodium: 144 mmol/L (ref 134–144)
Total Protein: 6.7 g/dL (ref 6.0–8.5)
eGFR: 42 mL/min/{1.73_m2} — ABNORMAL LOW (ref >59)

## 2022-01-29 LAB — HEMOGLOBIN A1C
Est. average glucose Bld gHb Est-mCnc: 117 mg/dL
Hgb A1c MFr Bld: 5.7 % — ABNORMAL HIGH (ref 4.8–5.6)

## 2022-01-29 LAB — MICROALBUMIN / CREATININE URINE RATIO
Creatinine, Urine: 51.6 mg/dL
Microalb/Creat Ratio: 30 mg/g creat — ABNORMAL HIGH (ref 0–29)
Microalbumin, Urine: 15.5 ug/mL

## 2022-01-29 LAB — PSA: Prostate Specific Ag, Serum: 0.2 ng/mL (ref 0.0–4.0)

## 2022-01-29 LAB — URIC ACID: Uric Acid: 6.1 mg/dL (ref 3.8–8.4)

## 2022-01-29 LAB — TSH: TSH: 11.5 u[IU]/mL — ABNORMAL HIGH (ref 0.450–4.500)

## 2022-01-29 LAB — LIPID PANEL
Chol/HDL Ratio: 2.4 ratio (ref 0.0–5.0)
Cholesterol, Total: 125 mg/dL (ref 100–199)
HDL: 53 mg/dL (ref >39)
LDL Chol Calc (NIH): 59 mg/dL (ref 0–99)
Triglycerides: 57 mg/dL (ref 0–149)
VLDL Cholesterol Cal: 13 mg/dL (ref 5–40)

## 2022-01-31 NOTE — Progress Notes (Signed)
Glucose 100, kidney tests remain 3b, potassium high 5.3 suggest follow up with nephrology, liver tests normal, A1c 5.7, Microalbuminuria OK, TSH high 11.500 is he taking his thyroid medicines, CBC normal, Cholesterol normal, PSA 0.2 normal, Uric acid 6.1 good ?lp

## 2022-02-01 ENCOUNTER — Telehealth: Payer: Self-pay

## 2022-02-02 NOTE — Telephone Encounter (Signed)
Patient is agreed with the change on thyroid medication. Can you please send a prescription.  ?

## 2022-02-03 ENCOUNTER — Other Ambulatory Visit: Payer: Self-pay | Admitting: Legal Medicine

## 2022-02-03 DIAGNOSIS — E039 Hypothyroidism, unspecified: Secondary | ICD-10-CM

## 2022-02-03 MED ORDER — LEVOTHYROXINE SODIUM 125 MCG PO TABS: 125.0000 ug | ORAL_TABLET | Freq: Every day | ORAL | 2 refills | Status: DC

## 2022-02-03 NOTE — Telephone Encounter (Signed)
Patient's wife was informed.

## 2022-02-03 NOTE — Telephone Encounter (Signed)
New Rx sent in ?lp ?

## 2022-02-11 ENCOUNTER — Ambulatory Visit (HOSPITAL_COMMUNITY)
Admission: RE | Admit: 2022-02-11 | Discharge: 2022-02-11 | Disposition: A | Discharge status: Home / Self Care | Payer: Medicare PPO | Source: Ambulatory Visit | Attending: Urology | Admitting: Urology

## 2022-02-11 DIAGNOSIS — N1330 Unspecified hydronephrosis: Secondary | ICD-10-CM | POA: Insufficient documentation

## 2022-02-14 DIAGNOSIS — E782 Mixed hyperlipidemia: Secondary | ICD-10-CM | POA: Diagnosis not present

## 2022-02-14 DIAGNOSIS — E039 Hypothyroidism, unspecified: Secondary | ICD-10-CM | POA: Diagnosis not present

## 2022-02-14 DIAGNOSIS — N183 Chronic kidney disease, stage 3 unspecified: Secondary | ICD-10-CM | POA: Diagnosis not present

## 2022-02-19 ENCOUNTER — Ambulatory Visit: Payer: Medicare PPO | Admitting: Urology

## 2022-02-19 VITALS — BP 167/87 | HR 67

## 2022-02-19 DIAGNOSIS — N1330 Unspecified hydronephrosis: Secondary | ICD-10-CM | POA: Diagnosis not present

## 2022-02-19 DIAGNOSIS — N401 Enlarged prostate with lower urinary tract symptoms: Secondary | ICD-10-CM

## 2022-02-19 DIAGNOSIS — R339 Retention of urine, unspecified: Secondary | ICD-10-CM

## 2022-02-19 LAB — URINALYSIS, ROUTINE W REFLEX MICROSCOPIC
Bilirubin, UA: NEGATIVE
Glucose, UA: NEGATIVE
Ketones, UA: NEGATIVE
Leukocytes,UA: NEGATIVE
Nitrite, UA: NEGATIVE
Protein,UA: NEGATIVE
RBC, UA: NEGATIVE
Specific Gravity, UA: 1.02 (ref 1.005–1.030)
Urobilinogen, Ur: 0.2 mg/dL (ref 0.2–1.0)
pH, UA: 7 (ref 5.0–7.5)

## 2022-02-19 LAB — BLADDER SCAN AMB NON-IMAGING: Scan Result: 630

## 2022-02-19 MED ORDER — BETHANECHOL CHLORIDE 10 MG PO TABS: 10.0000 mg | ORAL_TABLET | Freq: Two times a day (BID) | ORAL | 3 refills | Status: DC

## 2022-02-19 NOTE — Patient Instructions (Signed)
Hydronephrosis  Hydronephrosis is the swelling of one or both kidneys due to a blockage that stops urine from flowing out of the body. Kidneys filter waste from the blood and produce urine. This condition can lead to kidney failure and may become life-threatening if not treated promptly. What are the causes? In infants and children, common causes include problems that occur when a baby is developing in the womb. These can include problems in the kidneys or in the tubes that drain urine into the bladder (ureters). In adults, common causes include: Kidney stones. Pregnancy. A tumor or cyst in the abdomen or pelvis. An enlarged prostate gland. Other causes include: Bladder infection. Scar tissue from a previous surgery or injury. A blood clot. Cancer of the prostate, bladder, uterus, ovary, or colon. What are the signs or symptoms? Symptoms of this condition include: Pain or discomfort in your side (flank) or abdomen. Swelling in your abdomen. Nausea and vomiting. Fever. Pain when passing urine. Feelings of urgency when you need to urinate. Urinating more often than normal. In some cases, you may not have any symptoms. How is this diagnosed? This condition may be diagnosed based on: Your symptoms and medical history. A physical exam. Blood and urine tests. Imaging tests, such as an ultrasound, CT scan, or MRI. A procedure to look at your urinary tract and bladder by inserting a scope into the urethra (cystoscopy). How is this treated? Treatment for this condition depends on where the blockage is, how long it has been there, and what caused it. The goal of treatment is to remove the blockage. Treatment may include: Antibiotic medicines to treat or prevent infection. A procedure to place a small, thin tube (stent) into a blocked ureter. The stent will keep the ureter open so that urine can drain through it. A nonsurgical procedure that crushes kidney stones with shock waves  (extracorporeal shock wave lithotripsy). If kidney failure occurs, treatment may include dialysis or a kidney transplant. Follow these instructions at home:  Take over-the-counter and prescription medicines only as told by your health care provider. If you were prescribed an antibiotic medicine, take it exactly as told by your health care provider. Do not stop taking the antibiotic even if you start to feel better. Rest and return to your normal activities as told by your health care provider. Ask your health care provider what activities are safe for you. Drink enough fluid to keep your urine pale yellow. Keep all follow-up visits. This is important. Contact a health care provider if: You continue to have symptoms after treatment. You develop new symptoms. Your urine becomes cloudy or bloody. You have a fever. Get help right away if: You have severe flank or abdominal pain. You cannot drink fluids without vomiting. Summary Hydronephrosis is the swelling of one or both kidneys due to a blockage that stops urine from flowing out of the body. Hydronephrosis can lead to kidney failure and may become life-threatening if not treated promptly. The goal of treatment is to remove the blockage. It may include a procedure to insert a stent into a blocked ureter, a procedure to break up kidney stones, or taking antibiotic medicines. Follow your health care provider's instructions for taking care of yourself at home, including instructions about drinking fluids, taking medicines, and limiting activities. This information is not intended to replace advice given to you by your health care provider. Make sure you discuss any questions you have with your health care provider. Document Revised: 01/22/2020 Document Reviewed: 01/22/2020 Elsevier   Patient Education  2023 [employer]  

## 2022-02-19 NOTE — Progress Notes (Signed)
? ?02/19/2022 ?1:04 PM  ? ?David Baxter ?12/10/1940 ?[id number] ? ?Referring provider: Lillard Anes, MD ?[address] ?[address] ?[place],  [place] [address] ? ?Followup BPh and hydronephrosis ? ? ?HPI: ?[name] is a 81yo here for followup for BPH with incomplete emptying and hydronephrosis. Renal US 4/27 shows resolution of left hydronephrosis and improve right hydronephrosis. PVR 630. He is urinating better with increasing his bethenacol. IPSS 7 QOL 1. NO other complaints today ? ? ?PMH: ?Past Medical History:  ?Diagnosis Date  ? Acquired hypothyroidism 04/08/2018  ? Acute retention of urine 04/09/2018  ? Acute sinusitis 06/05/2020  ? AKI (acute kidney injury) ([place]) 04/08/2018  ? Arthralgia of both lower legs   ? BMI 23.0-23.9, adult 04/21/2020  ? BPH (benign prostatic hyperplasia)   ? BPH with urinary obstruction 12/05/2019  ? CAD (coronary artery disease)   ? cardiologist--  dr [facility] w/ [facility] in [place]  ? CKD (chronic kidney disease) stage 3, GFR 30-59 ml/min (HCC) 07/06/2015  ? CKD (chronic kidney disease), stage III ([place])   ? Coronary artery disease involving coronary bypass graft of native heart 04/09/2015  ? Overview:  1. DES OM 2012    2. two DES to circumflex coronary artery 03/03/15  ? 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 Essential hypertension 04/09/2015  ? Gout 04/08/2018  ? History of gout   ? History of kidney stones   ? History of stroke   ? per pt/ wife -- unknown when-- told this was on MRI in 2017  ? HOH (hard of hearing)   ? Hyperkalemia 05/09/2020  ? Hyperlipemia, retention 04/09/2015  ? Hyperlipidemia   ? Hypertension   ? Hypothyroidism   ? S/P drug eluting coronary stent placement   ? 2012-- x1DES to OM:  05/ 2016  x2 to CFX  ? 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 SIRS (systemic inflammatory response syndrome) ([place]) 04/08/2018  ? Type 2 diabetes mellitus ([place]) 04/08/2018  ? UTI (urinary tract infection) 04/08/2018  ? ? ?Surgical History: ?Past Surgical  History:  ?Procedure Laterality Date  ? CORONARY ANGIOPLASTY WITH STENT PLACEMENT  03/04/2015  ? x2 DES to CFX  ? CORONARY ANGIOPLASTY WITH STENT PLACEMENT  2012  ? x1 DES to OM  ? CYSTOSCOPY WITH INSERTION OF UROLIFT N/A 02/14/2017  ? Procedure: CYSTOSCOPY WITH INSERTION OF UROLIFT;  Surgeon: David Gustin, MD;  Location[facility];  Service: Urology;  Laterality: N/A;  ? EXTRACORPOREAL SHOCK WAVE LITHOTRIPSY  1990's  ? PERCUTANEOUS NEPHROSTOLITHOTOMY  1991  ? and abdominal hernia repair  ? URETEROLITHOTOMY  1991 and 1994  ? ? ?Home Medications:  ?Allergies as of 02/19/2022   ? ?   Reactions  ? Tamsulosin Other (See Comments)  ? Aspirin Other (See Comments)  ? Upset stomach  ? ?  ? ?  ?Medication List  ?  ? ?  ? Accurate as of Feb 19, 2022  1:04 PM. If you have any questions, ask your nurse or doctor.  ?  ?  ? ?  ? ?Accu-Chek Aviva Plus test strip ?Generic drug: glucose blood ?USE TWICE DAILY AS DIRECTED ?  ?Accu-Chek Aviva Soln ?1 each by In Vitro route every 30 (thirty) days. ?  ?Accu-Chek FastClix Lancets Misc ?1 each by Does not apply route in the morning and at bedtime. Use to check blood sugar twice daily as directed. ?  ?Alcohol Prep 70 % Pads ?1 each by Does not apply route daily. ?  ?  allopurinol 100 MG tablet ?Commonly known as: ZYLOPRIM ?TAKE 1 TABLET EVERY DAY ?  ?aspirin EC 81 MG tablet ?Take 81 mg by mouth daily. ?  ?bethanechol 10 MG tablet ?Commonly known as: URECHOLINE ?Take 1 tablet (10 mg total) by mouth in the morning and at bedtime. ?What changed: Another medication with the same name was removed. Continue taking this medication, and follow the directions you see here. ?  ?Colcrys 0.6 MG tablet ?Generic drug: colchicine ?Prn gout flares ?  ?finasteride 5 MG tablet ?Commonly known as: PROSCAR ?Take 1 tablet (5 mg total) by mouth daily. ?  ?gabapentin 100 MG capsule ?Commonly known as: NEURONTIN ?Take 1 capsule (100 mg total) by mouth 3 (three) times daily. ?  ?levothyroxine 125  MCG tablet ?Commonly known as: SYNTHROID ?Take 1 tablet (125 mcg total) by mouth daily. ?  ?Lokelma 10 g Pack packet ?Generic drug: sodium zirconium cyclosilicate ?Take 10 g by mouth daily. ?  ?meclizine 25 MG tablet ?Commonly known as: ANTIVERT ?Take by mouth. Takes OTC prn dizziness. ?  ?metFORMIN 500 MG tablet ?Commonly known as: GLUCOPHAGE ?TAKE 1 TABLET TWICE DAILY ?  ?metoprolol tartrate 25 MG tablet ?Commonly known as: LOPRESSOR ?TAKE 1 TABLET TWICE DAILY ?  ?nitroGLYCERIN 0.4 MG SL tablet ?Commonly known as: NITROSTAT ?Place 0.4 mg under the tongue every 5 (five) minutes as needed for chest pain. ?  ?simvastatin 40 MG tablet ?Commonly known as: ZOCOR ?TAKE 1/2 TABLET EVERY DAY ?  ?sodium bicarbonate 650 MG tablet ?Take by mouth 2 (two) times daily. ?  ?vitamin B-12 1000 MCG tablet ?Commonly known as: CYANOCOBALAMIN ?Take 1,000 mcg by mouth daily. ?  ?Vitamin D-1000 Max St 25 MCG (1000 UT) tablet ?Generic drug: Cholecalciferol ?Take 2,000 Units by mouth. ?  ? ?  ? ? ?Allergies:  ?Allergies  ?Allergen Reactions  ? Tamsulosin Other (See Comments)  ? Aspirin Other (See Comments)  ?  Upset stomach  ? ? ?Family History: ?Family History  ?Problem Relation Age of Onset  ? Heart disease Mother 29  ?     After age 60 - CHF  ? Hypertension Father   ? Heart disease Father 48  ?     After age 65 - CHF  ? ? ?Social History:  reports that he has never smoked. He has never been exposed to tobacco smoke. He has never used smokeless tobacco. He reports that he does not drink alcohol and does not use drugs. ? ?ROS: ?All other review of systems were reviewed and are negative except what is noted above in HPI ? ?Physical Exam: ?BP (!) 167/87   Pulse 67   ?Constitutional:  Alert and oriented, No acute distress. ?HEENT: [place] AT, moist mucus membranes.  Trachea midline, no masses. ?Cardiovascular: No clubbing, cyanosis, or edema. ?Respiratory: Normal respiratory effort, no increased work of breathing. ?GI: Abdomen is soft,  nontender, nondistended, no abdominal masses ?GU: No CVA tenderness.  ?Lymph: No cervical or inguinal lymphadenopathy. ?Skin: No rashes, bruises or suspicious lesions. ?Neurologic: Grossly intact, no focal deficits, moving all 4 extremities. ?Psychiatric: Normal mood and affect. ? ?Laboratory Data: ?Lab Results  ?Component Value Date  ? WBC 9.1 01/26/2022  ? HGB 13.8 01/26/2022  ? HCT 42.8 01/26/2022  ? MCV 91 01/26/2022  ? PLT 327 01/26/2022  ? ? ?Lab Results  ?Component Value Date  ? CREATININE 1.62 (H) 01/26/2022  ? ? ?No results found for: PSA ? ?No results found for: TESTOSTERONE ? ?Lab Results  ?Component Value Date  ?  HGBA1C 5.7 (H) 01/26/2022  ? ? ?Urinalysis ?   ?Component Value Date/Time  ? APPEARANCEUR Clear 01/01/2022 1327  ? GLUCOSEU Negative 01/01/2022 1327  ? BILIRUBINUR Negative 01/01/2022 1327  ? PROTEINUR Negative 01/01/2022 1327  ? NITRITE Negative 01/01/2022 1327  ? LEUKOCYTESUR Negative 01/01/2022 1327  ? ? ?Lab Results  ?Component Value Date  ? LABMICR 15.5 01/26/2022  ? ? ?Pertinent Imaging: ?Renal US 02/11/2022: Images reviewed and discussed with the patient  ?No results found for this or any previous visit. ? ?No results found for this or any previous visit. ? ?No results found for this or any previous visit. ? ?No results found for this or any previous visit. ? ?Results for orders placed during the hospital encounter of 02/11/22 ? ?Ultrasound renal complete ? ?Narrative ?CLINICAL DATA:  Follow-up of hydronephrosis ? ?EXAM: ?RENAL / URINARY TRACT ULTRASOUND COMPLETE ? ?COMPARISON:  09/23/2021 ? ?FINDINGS: ?Right Kidney: ? ?Renal measurements: 9.2 x 5.4 x 5.8 cm = volume: 152 mL. Lower pole ?right renal cyst or minimally complex cyst of maximally 4.0 cm. ?Increased renal echogenicity. ? ?Slight improvement in mild right-sided caliectasis with moderate ?proximal right hydroureter. ? ?Left Kidney: ? ?Renal measurements: 9.6 x 4.8 x 5.1 cm = volume: 121 mL. Increased ?echogenicity. Resolution of  hydronephrosis. ? ?Bladder: ? ?Postvoid bladder volume of 642 cc. No bladder abnormality ?identified. ? ?Other: ? ?None. ? ?IMPRESSION: ?Resolved left and improved right-sided hydronephrosis. ? ?Significant postv

## 2022-02-19 NOTE — Progress Notes (Signed)
post void residual=630 ?

## 2022-03-02 ENCOUNTER — Encounter: Payer: Self-pay | Admitting: Urology

## 2022-03-02 ENCOUNTER — Telehealth: Payer: Self-pay

## 2022-03-02 NOTE — Telephone Encounter (Signed)
LVM for pt to call back as soon as possible.  ? ?RE: schedule AWV ?

## 2022-03-19 ENCOUNTER — Telehealth: Payer: Self-pay

## 2022-03-19 NOTE — Progress Notes (Signed)
Chronic Care Management Pharmacy Assistant   Name: David Baxter  MRN: [id number] DOB: 1941/03/21   Reason for Encounter: Disease State call for DM    Recent office visits:  02/03/22 [name] MD. Orders Only. Increased Levothyroxine to 174mcg daily.   Recent consult visits:  02/19/22 (Urology) Nicolette Bang MD. Seen for Prostatic Hyperplasia. Increased Bethanechol Chloride to 10mg  2 times daily.   Hospital visits:  None  Medications: Outpatient Encounter Medications as of 03/19/2022  Medication Sig   ACCU-CHEK AVIVA PLUS test strip USE TWICE DAILY AS DIRECTED   Accu-Chek FastClix Lancets MISC 1 each by Does not apply route in the morning and at bedtime. Use to check blood sugar twice daily as directed.   Alcohol Swabs (ALCOHOL PREP) 70 % PADS 1 each by Does not apply route daily.   allopurinol (ZYLOPRIM) 100 MG tablet TAKE 1 TABLET EVERY DAY   aspirin EC 81 MG tablet Take 81 mg by mouth daily.   bethanechol (URECHOLINE) 10 MG tablet Take 1 tablet (10 mg total) by mouth in the morning and at bedtime.   Blood Glucose Calibration (ACCU-CHEK AVIVA) SOLN 1 each by In Vitro route every 30 (thirty) days.   Cholecalciferol (VITAMIN D-1000 MAX ST) 25 MCG (1000 UT) tablet Take 2,000 Units by mouth.    COLCRYS 0.6 MG tablet Prn gout flares   finasteride (PROSCAR) 5 MG tablet Take 1 tablet (5 mg total) by mouth daily.   gabapentin (NEURONTIN) 100 MG capsule Take 1 capsule (100 mg total) by mouth 3 (three) times daily.   levothyroxine (SYNTHROID) 125 MCG tablet Take 1 tablet (125 mcg total) by mouth daily.   LOKELMA 10 g PACK packet Take 10 g by mouth daily.    meclizine (ANTIVERT) 25 MG tablet Take by mouth. Takes OTC prn dizziness.   metFORMIN (GLUCOPHAGE) 500 MG tablet TAKE 1 TABLET TWICE DAILY   metoprolol tartrate (LOPRESSOR) 25 MG tablet TAKE 1 TABLET TWICE DAILY   nitroGLYCERIN (NITROSTAT) 0.4 MG SL tablet Place 0.4 mg under the tongue every 5 (five) minutes as needed for  chest pain.   simvastatin (ZOCOR) 40 MG tablet TAKE 1/2 TABLET EVERY DAY   sodium bicarbonate 650 MG tablet Take by mouth 2 (two) times daily.   vitamin B-12 (CYANOCOBALAMIN) 1000 MCG tablet Take 1,000 mcg by mouth daily.   No facility-administered encounter medications on file as of 03/19/2022.    Recent Relevant Labs: Lab Results  Component Value Date/Time   HGBA1C 5.7 (H) 01/26/2022 08:56 AM   HGBA1C 6.0 (H) 09/22/2021 09:15 AM   MICROALBUR 80 12/23/2020 09:03 AM    Kidney Function Lab Results  Component Value Date/Time   CREATININE 1.62 (H) 01/26/2022 08:56 AM   CREATININE 1.60 (H) 09/22/2021 09:15 AM   GFRNONAA 43 (L) 08/19/2020 09:32 AM   GFRAA 50 (L) 08/19/2020 09:32 AM     Current antihyperglycemic regimen:  Metformin 500mg  twice daily   Adherence Review: Is the patient currently on a STATIN medication? Yes Is the patient currently on ACE/ARB medication? No Does the patient have >5 day gap between last estimated fill dates? CPP to review  Care Gaps: Last eye exam / Retinopathy Screening? 09/29/20 Last Annual Wellness Visit? None noted  Last Diabetic Foot Exam? 01/26/22   Star Rating Drugs:  Medication:  Last Fill: Day Supply Metformin   02/09/22 90ds    11/24/21  90ds  Unable tor reach pt to complete this call    Elray Mcgregor, CMA Clinical  Pharmacist Assistant  [phone]

## 2022-04-08 DIAGNOSIS — I10 Essential (primary) hypertension: Secondary | ICD-10-CM | POA: Diagnosis not present

## 2022-04-08 DIAGNOSIS — N139 Obstructive and reflux uropathy, unspecified: Secondary | ICD-10-CM | POA: Diagnosis not present

## 2022-04-08 DIAGNOSIS — R809 Proteinuria, unspecified: Secondary | ICD-10-CM | POA: Diagnosis not present

## 2022-04-08 DIAGNOSIS — E039 Hypothyroidism, unspecified: Secondary | ICD-10-CM | POA: Diagnosis not present

## 2022-04-08 DIAGNOSIS — N400 Enlarged prostate without lower urinary tract symptoms: Secondary | ICD-10-CM | POA: Diagnosis not present

## 2022-04-08 DIAGNOSIS — E559 Vitamin D deficiency, unspecified: Secondary | ICD-10-CM | POA: Diagnosis not present

## 2022-04-08 DIAGNOSIS — N189 Chronic kidney disease, unspecified: Secondary | ICD-10-CM | POA: Diagnosis not present

## 2022-04-08 DIAGNOSIS — E1169 Type 2 diabetes mellitus with other specified complication: Secondary | ICD-10-CM | POA: Diagnosis not present

## 2022-04-08 DIAGNOSIS — E211 Secondary hyperparathyroidism, not elsewhere classified: Secondary | ICD-10-CM | POA: Diagnosis not present

## 2022-04-08 DIAGNOSIS — D649 Anemia, unspecified: Secondary | ICD-10-CM | POA: Diagnosis not present

## 2022-04-08 DIAGNOSIS — M109 Gout, unspecified: Secondary | ICD-10-CM | POA: Diagnosis not present

## 2022-04-08 DIAGNOSIS — E875 Hyperkalemia: Secondary | ICD-10-CM | POA: Diagnosis not present

## 2022-04-08 DIAGNOSIS — N183 Chronic kidney disease, stage 3 unspecified: Secondary | ICD-10-CM | POA: Diagnosis not present

## 2022-04-08 DIAGNOSIS — R309 Painful micturition, unspecified: Secondary | ICD-10-CM | POA: Diagnosis not present

## 2022-04-08 DIAGNOSIS — M1000 Idiopathic gout, unspecified site: Secondary | ICD-10-CM | POA: Diagnosis not present

## 2022-05-07 ENCOUNTER — Other Ambulatory Visit: Payer: Self-pay | Admitting: Legal Medicine

## 2022-05-07 DIAGNOSIS — Z8739 Personal history of other diseases of the musculoskeletal system and connective tissue: Secondary | ICD-10-CM

## 2022-05-07 DIAGNOSIS — I10 Essential (primary) hypertension: Secondary | ICD-10-CM

## 2022-05-07 DIAGNOSIS — E1121 Type 2 diabetes mellitus with diabetic nephropathy: Secondary | ICD-10-CM

## 2022-05-20 ENCOUNTER — Telehealth: Payer: Self-pay

## 2022-05-20 NOTE — Progress Notes (Signed)
    Chronic Care Management Pharmacy Assistant   Name: David Baxter  MRN: [id number] DOB: June 17, 1941   Reason for Encounter: Disease State call for DM    Recent office visits:  None  Recent consult visits:  04/08/22 (Nephrology) Adegoroye, Charlies Silvers, MD . Seen for Hyerkalemia. Ordered Sodium Bicarbonate 650mg .   Hospital visits:  None  Medications: Outpatient Encounter Medications as of 05/20/2022  Medication Sig   ACCU-CHEK AVIVA PLUS test strip USE TWICE DAILY AS DIRECTED   Accu-Chek FastClix Lancets MISC 1 each by Does not apply route in the morning and at bedtime. Use to check blood sugar twice daily as directed.   Alcohol Swabs (ALCOHOL PREP) 70 % PADS 1 each by Does not apply route daily.   allopurinol (ZYLOPRIM) 100 MG tablet TAKE 1 TABLET EVERY DAY   aspirin EC 81 MG tablet Take 81 mg by mouth daily.   bethanechol (URECHOLINE) 10 MG tablet Take 1 tablet (10 mg total) by mouth in the morning and at bedtime.   Blood Glucose Calibration (ACCU-CHEK AVIVA) SOLN 1 each by In Vitro route every 30 (thirty) days.   Cholecalciferol (VITAMIN D-1000 MAX ST) 25 MCG (1000 UT) tablet Take 2,000 Units by mouth.    COLCRYS 0.6 MG tablet Prn gout flares   finasteride (PROSCAR) 5 MG tablet Take 1 tablet (5 mg total) by mouth daily.   gabapentin (NEURONTIN) 100 MG capsule Take 1 capsule (100 mg total) by mouth 3 (three) times daily.   levothyroxine (SYNTHROID) 125 MCG tablet Take 1 tablet (125 mcg total) by mouth daily.   LOKELMA 10 g PACK packet Take 10 g by mouth daily.    meclizine (ANTIVERT) 25 MG tablet Take by mouth. Takes OTC prn dizziness.   metFORMIN (GLUCOPHAGE) 500 MG tablet TAKE 1 TABLET TWICE DAILY   metoprolol tartrate (LOPRESSOR) 25 MG tablet TAKE 1 TABLET TWICE DAILY   nitroGLYCERIN (NITROSTAT) 0.4 MG SL tablet Place 0.4 mg under the tongue every 5 (five) minutes as needed for chest pain.   simvastatin (ZOCOR) 40 MG tablet TAKE 1/2 TABLET EVERY DAY   sodium bicarbonate 650  MG tablet Take by mouth 2 (two) times daily.   vitamin B-12 (CYANOCOBALAMIN) 1000 MCG tablet Take 1,000 mcg by mouth daily.   No facility-administered encounter medications on file as of 05/20/2022.    Recent Relevant Labs: Lab Results  Component Value Date/Time   HGBA1C 5.7 (H) 01/26/2022 08:56 AM   HGBA1C 6.0 (H) 09/22/2021 09:15 AM   MICROALBUR 80 12/23/2020 09:03 AM    Kidney Function Lab Results  Component Value Date/Time   CREATININE 1.62 (H) 01/26/2022 08:56 AM   CREATININE 1.60 (H) 09/22/2021 09:15 AM   GFRNONAA 43 (L) 08/19/2020 09:32 AM   GFRAA 50 (L) 08/19/2020 09:32 AM     Current antihyperglycemic regimen:  Metformin 500mg  twice daily   Adherence Review: Is the patient currently on a STATIN medication? Yes Is the patient currently on ACE/ARB medication? No Does the patient have >5 day gap between last estimated fill dates? CPP to review  Care Gaps: Last eye exam / Retinopathy Screening? 09/29/20 Last Annual Wellness Visit?  Last Diabetic Foot Exam? 01/26/22   Star Rating Drugs:  Medication:  Last Fill: Day Supply Metformin   05/08/22-02/09/22 90ds   Unable to reach pt to complete this call   05/10/22, [facility] Clinical Pharmacist Assistant  [phone]

## 2022-06-03 ENCOUNTER — Telehealth: Payer: Self-pay

## 2022-06-03 NOTE — Progress Notes (Signed)
Care Gap(s) Not Met that Need to be Addressed:  Controlling High Blood Pressure  Action Taken: Called pt to retake BP 1st attempt vm too full  2nd attempt Not available  3rd attempt    Follow Up:

## 2022-06-09 ENCOUNTER — Ambulatory Visit (INDEPENDENT_AMBULATORY_CARE_PROVIDER_SITE_OTHER): Payer: Medicare PPO | Admitting: Legal Medicine

## 2022-06-09 ENCOUNTER — Encounter: Payer: Self-pay | Admitting: Legal Medicine

## 2022-06-09 VITALS — BP 120/80 | HR 58 | Temp 97.7°F | Resp 14 | Ht 68.0 in | Wt 166.0 lb

## 2022-06-09 DIAGNOSIS — I10 Essential (primary) hypertension: Secondary | ICD-10-CM | POA: Diagnosis not present

## 2022-06-09 DIAGNOSIS — N401 Enlarged prostate with lower urinary tract symptoms: Secondary | ICD-10-CM | POA: Diagnosis not present

## 2022-06-09 DIAGNOSIS — E782 Mixed hyperlipidemia: Secondary | ICD-10-CM

## 2022-06-09 DIAGNOSIS — R350 Frequency of micturition: Secondary | ICD-10-CM

## 2022-06-09 DIAGNOSIS — E1121 Type 2 diabetes mellitus with diabetic nephropathy: Secondary | ICD-10-CM

## 2022-06-09 DIAGNOSIS — M1A00X0 Idiopathic chronic gout, unspecified site, without tophus (tophi): Secondary | ICD-10-CM | POA: Diagnosis not present

## 2022-06-09 DIAGNOSIS — E1169 Type 2 diabetes mellitus with other specified complication: Secondary | ICD-10-CM

## 2022-06-09 DIAGNOSIS — E039 Hypothyroidism, unspecified: Secondary | ICD-10-CM | POA: Diagnosis not present

## 2022-06-09 DIAGNOSIS — N183 Chronic kidney disease, stage 3 unspecified: Secondary | ICD-10-CM | POA: Diagnosis not present

## 2022-06-09 DIAGNOSIS — E441 Mild protein-calorie malnutrition: Secondary | ICD-10-CM

## 2022-06-09 DIAGNOSIS — I25708 Atherosclerosis of coronary artery bypass graft(s), unspecified, with other forms of angina pectoris: Secondary | ICD-10-CM | POA: Diagnosis not present

## 2022-06-09 DIAGNOSIS — E211 Secondary hyperparathyroidism, not elsewhere classified: Secondary | ICD-10-CM

## 2022-06-09 NOTE — Progress Notes (Signed)
Subjective:  Patient ID: David Baxter, male    DOB: 1941-07-12  Age: 81 y.o. MRN: [id number]  Chief Complaint  Patient presents with   Hypothyroidism   Diabetes   Hypertension    HPI Patient present with type 2 diabetes.  Compliance with treatment has been good; patient take medicines as directed, maintains diet and exercise regimen, follows up as directed, and is keeping glucose diary. Current medicines for diabetes Metformin 500 mg twice a day. Patient performs foot exams daily and last ophthalmologic exam was 09/29/2020. Last A1C was 5.7%  Patient presents with hyperlipidemia.  Compliance with treatment has been good; patient takes medicines as directed, maintains low cholesterol diet, follows up as directed, and maintains exercise regimen.  Patient is using Simvastatin 40 mg daily without problems.   Patient presents for follow up of hypertension.  Patient tolerating Metoprolol 25 mg twice a day, Aspirin 81 mg daily well without side effects. Patient is working on maintaining diet and exercise regimen and follows up as directed.   Hypothyroidism: Patient is taking Levothyroxine 125 mcg daily.  GOUT: Taking Allopurinol 100 mg daily.  No angina, occasional vertigo Current Outpatient Medications on File Prior to Visit  Medication Sig Dispense Refill   ACCU-CHEK AVIVA PLUS test strip USE TWICE DAILY AS DIRECTED 200 strip 3   Accu-Chek FastClix Lancets MISC 1 each by Does not apply route in the morning and at bedtime. Use to check blood sugar twice daily as directed. 100 each 6   Alcohol Swabs (ALCOHOL PREP) 70 % PADS 1 each by Does not apply route daily. 100 each 2   allopurinol (ZYLOPRIM) 100 MG tablet TAKE 1 TABLET EVERY DAY 90 tablet 2   aspirin EC 81 MG tablet Take 81 mg by mouth daily.     bethanechol (URECHOLINE) 10 MG tablet Take 1 tablet (10 mg total) by mouth in the morning and at bedtime. 180 tablet 3   Blood Glucose Calibration (ACCU-CHEK AVIVA) SOLN 1 each by In Vitro  route every 30 (thirty) days. 1 each 5   Cholecalciferol (VITAMIN D-1000 MAX ST) 25 MCG (1000 UT) tablet Take 2,000 Units by mouth.      COLCRYS 0.6 MG tablet Prn gout flares     finasteride (PROSCAR) 5 MG tablet Take 1 tablet (5 mg total) by mouth daily. 90 tablet 3   levothyroxine (SYNTHROID) 125 MCG tablet Take 1 tablet (125 mcg total) by mouth daily. 90 tablet 2   LOKELMA 10 g PACK packet Take 10 g by mouth daily.      meclizine (ANTIVERT) 25 MG tablet Take by mouth. Takes OTC prn dizziness.     metFORMIN (GLUCOPHAGE) 500 MG tablet TAKE 1 TABLET TWICE DAILY 180 tablet 2   metoprolol tartrate (LOPRESSOR) 25 MG tablet TAKE 1 TABLET TWICE DAILY 180 tablet 2   nitroGLYCERIN (NITROSTAT) 0.4 MG SL tablet Place 0.4 mg under the tongue every 5 (five) minutes as needed for chest pain.     simvastatin (ZOCOR) 40 MG tablet TAKE 1/2 TABLET EVERY DAY 45 tablet 2   sodium bicarbonate 650 MG tablet Take by mouth 2 (two) times daily.     vitamin B-12 (CYANOCOBALAMIN) 1000 MCG tablet Take 1,000 mcg by mouth daily.     No current facility-administered medications on file prior to visit.   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 DES OM 2012    2. 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   SIRS (systemic inflammatory response syndrome) ([place]) 04/08/2018   Type 2 diabetes mellitus ([place]) 04/08/2018   UTI (urinary tract infection) 04/08/2018   Past Surgical History:  Procedure Laterality Date   CORONARY ANGIOPLASTY WITH STENT PLACEMENT  03/04/2015   x2 DES to CFX   CORONARY ANGIOPLASTY WITH STENT PLACEMENT  2012   x1 DES to OM   CYSTOSCOPY WITH INSERTION OF UROLIFT N/A 02/14/2017   Procedure: CYSTOSCOPY WITH INSERTION OF UROLIFT;  Surgeon: Cleon Gustin, MD;  Location: [facility];  Service: Urology;  Laterality: N/A;   EXTRACORPOREAL SHOCK WAVE LITHOTRIPSY  32's   PERCUTANEOUS NEPHROSTOLITHOTOMY  1991   and abdominal hernia repair   URETEROLITHOTOMY  1991 and 1994    Family History  Problem Relation Age of Onset   Heart disease Mother 78       After age 29 - CHF   Hypertension Father    Heart disease Father 81       After age 44 - CHF   Social History   Socioeconomic History   Marital status: Married    Spouse name: Not on file   Number of children: Not on file   Years of education: Not on file   Highest education level: Not on file  Occupational History   Occupation: truck driver  Tobacco Use   Smoking status: Never    Passive exposure: Never   Smokeless tobacco: Never  Vaping Use   Vaping Use: Never used  Substance and Sexual Activity   Alcohol use: No   Drug use: No   Sexual activity: Yes    Partners: Female  Other Topics Concern   Not on file  Social History Narrative   Not on file   Social Determinants of Health   Financial  Resource Strain: High Risk (01/28/2022)   Overall Financial Resource Strain (CARDIA)    Difficulty of Paying Living Expenses: Hard  Food Insecurity: No Food Insecurity (06/11/2020)   Hunger Vital Sign    Worried About Running Out of Food in the Last Year: Never true    Ran Out of Food in the Last Year: Never true  Transportation Needs: No Transportation Needs (01/28/2022)   PRAPARE - Hydrologist (Medical): No  Lack of Transportation (Non-Medical): No  Physical Activity: Not on file  Stress: Not on file  Social Connections: Not on file    Review of Systems  Constitutional:  Negative for chills, fatigue, fever and unexpected weight change.  HENT:  Negative for congestion, ear pain, sinus pain and sore throat.   Eyes:  Negative for visual disturbance.  Respiratory:  Negative for cough and shortness of breath.   Cardiovascular:  Negative for chest pain and palpitations.  Gastrointestinal:  Negative for abdominal pain, blood in stool, constipation, diarrhea, nausea and vomiting.  Endocrine: Negative for polydipsia.  Genitourinary:  Negative for dysuria.  Musculoskeletal:  Negative for back pain.  Skin:  Negative for rash.  Neurological:  Negative for headaches.     Objective:  BP 120/80   Pulse (!) 58   Temp 97.7 F (36.5 C)   Resp 14   Ht 5\' 8"  (1.727 m)   Wt 166 lb (75.3 kg)   SpO2 93%   BMI 25.24 kg/m      06/09/2022    9:04 AM 02/19/2022    1:03 PM 01/26/2022    8:01 AM  BP/Weight  Systolic BP 120 167 136  Diastolic BP 80 87 80  Wt. (Lbs) 166  169.2  BMI 25.24 kg/m2  25.73 kg/m2    Physical Exam Vitals reviewed.  Constitutional:      General: He is not in acute distress.    Appearance: Normal appearance.  HENT:     Head: Normocephalic and atraumatic.     Right Ear: Tympanic membrane normal.     Left Ear: Tympanic membrane normal.     Mouth/Throat:     Mouth: Mucous membranes are moist.     Pharynx: Oropharynx is clear.  Eyes:      Conjunctiva/sclera: Conjunctivae normal.     Pupils: Pupils are equal, round, and reactive to light.  Cardiovascular:     Rate and Rhythm: Normal rate and regular rhythm.     Pulses: Normal pulses.     Heart sounds: Normal heart sounds. No murmur heard.    No gallop.  Pulmonary:     Effort: Pulmonary effort is normal. No respiratory distress.     Breath sounds: Normal breath sounds. No wheezing.  Abdominal:     General: Abdomen is flat. Bowel sounds are normal. There is no distension.     Palpations: Abdomen is soft.     Tenderness: There is no abdominal tenderness.  Musculoskeletal:        General: Normal range of motion.     Cervical back: Normal range of motion and neck supple.     Right lower leg: No edema.     Left lower leg: No edema.  Skin:    General: Skin is warm.     Capillary Refill: Capillary refill takes less than 2 seconds.  Neurological:     General: No focal deficit present.     Mental Status: He is alert and oriented to person, place, and time. Mental status is at baseline.  Psychiatric:        Mood and Affect: Mood normal.        Thought Content: Thought content normal.        Judgment: Judgment normal.     Diabetic Foot Exam - Simple   Simple Foot Form Diabetic Foot exam was performed with the following findings: Yes 06/09/2022  9:31 AM  Visual Inspection No deformities, no ulcerations, no other skin breakdown bilaterally: Yes Sensation Testing  Intact to touch and monofilament testing bilaterally: Yes Pulse Check See comments: Yes Comments Slow capillary filling      Lab Results  Component Value Date   WBC 9.1 01/26/2022   HGB 13.8 01/26/2022   HCT 42.8 01/26/2022   PLT 327 01/26/2022   GLUCOSE 100 (H) 01/26/2022   CHOL 125 01/26/2022   TRIG 57 01/26/2022   HDL 53 01/26/2022   LDLCALC 59 01/26/2022   ALT 7 01/26/2022   AST 14 01/26/2022   NA 144 01/26/2022   K 5.3 (H) 01/26/2022   CL 105 01/26/2022   CREATININE 1.62 (H) 01/26/2022    BUN 21 01/26/2022   CO2 25 01/26/2022   TSH 11.500 (H) 01/26/2022   HGBA1C 5.7 (H) 01/26/2022   MICROALBUR 80 12/23/2020      Assessment & Plan:   Problem List Items Addressed This Visit       Cardiovascular and Mediastinum   Coronary artery disease of bypass graft of native heart with stable angina pectoris (HCC) - Primary An individual plan was formulated based on patient history and exam, labs and evidence based data. Patient has not had recent angina or nitroglycerin use. continue present treatment.     Essential hypertension   Relevant Orders   Comprehensive metabolic panel   CBC with Differential/Platelet An individual hypertension care plan was established and reinforced today.  The patient's status was assessed using clinical findings on exam and labs or diagnostic tests. The patient's success at meeting treatment goals on disease specific evidence-based guidelines and found to be well controlled. SELF MANAGEMENT: The patient and I together assessed ways to personally work towards obtaining the recommended goals. RECOMMENDATIONS: avoid decongestants found in common cold remedies, decrease consumption of alcohol, perform routine monitoring of BP with home BP cuff, exercise, reduction of dietary salt, take medicines as prescribed, try not to miss doses and quit smoking.  Regular exercise and maintaining a healthy weight is needed.  Stress reduction may help. A CLINICAL SUMMARY including written plan identify barriers to care unique to individual due to social or financial issues.  We attempt to mutually creat solutions for individual and family understanding.      Endocrine   Acquired hypothyroidism   Relevant Orders   TSH Patient is known to have hypothyroidism and is on treatment with levothyroxine .  Patient was diagnosed 10 years ago.    Patient is compliant with medicines and last TSH 6 months ago.  Last TSH was normal.     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  It is important to maintain good Blood Pressure control  and diabetes control.Marland Kitchen Keep on low protein diet and remain with adequate hydration to maintain function.     Type 2 diabetes mellitus with other specified complication (HCC)   Relevant Orders   Hemoglobin A1c An individual care plan for diabetes was established and reinforced today.  The patient's status was assessed using clinical findings on exam, labs and diagnostic testing. Patient success at meeting goals based on disease specific evidence-based guidelines and found to be good controlled. Medications were assessed and patient's understanding of the medical issues , including barriers were assessed. Recommend adherence to a diabetic diet, a graduated exercise program, HgbA1c level is checked quarterly, and urine microalbumin performed yearly .  Annual mono-filament sensation testing performed. Lower blood pressure and control hyperlipidemia is important. Get annual eye  exams and annual flu shots and smoking cessation discussed.  Self management goals were discussed.      Genitourinary   Benign prostatic hyperplasia   Relevant Orders   PSA AN INDIVIDUAL CARE PLAN for BPH was established and reinforced today.  The patient's status was assessed using clinical findings on exam, labs, and other diagnostic testing. Patient's success at meeting treatment goals based on disease specific evidence-bassed guidelines and found to be in good control. RECOMMENDATIONS include maintaining present medicines and treatment.     Chronic kidney disease, stage 3 unspecified (HCC) Patient was evaluated for stage 3b.  It is important to maintain good Blood Pressure control and diabetes control. Keep on low protein diet and remain with adequate hydration to maintain function.      Other   Hyperlipidemia    Relevant Orders   Lipid panel AN INDIVIDUAL CARE PLAN for hyperlipidemia/ cholesterol was established and reinforced today.  The patient's status was assessed using clinical findings on exam, lab and other diagnostic tests. The patient's disease status was assessed based on evidence-based guidelines and found to be fair controlled. MEDICATIONS were reviewed. SELF MANAGEMENT GOALS have been discussed and patient's success at attaining the goal of low cholesterol was assessed. RECOMMENDATION given include regular exercise 3 days a week and low cholesterol/low fat diet. CLINICAL SUMMARY including written plan to identify barriers unique to the patient due to social or economic  reasons was discussed.     Malnutrition of mild degree (HCC) Supplement nutrition with protein/calorie supplement with meals to improve nutritional status.    Gout   Relevant Orders   Uric acid Check uric acid level  .    Orders Placed This Encounter  Procedures   Comprehensive metabolic panel   Hemoglobin A1c   Lipid panel   CBC with Differential/Platelet   PSA   Uric acid   TSH     Follow-up: Return in about 4 months (around 10/09/2022).  An After Visit Summary was printed and given to the patient.  Brent Bulla, MD [facility] [phone]

## 2022-06-10 LAB — CBC WITH DIFFERENTIAL/PLATELET
Basophils Absolute: 0.1 10*3/uL (ref 0.0–0.2)
Basos: 1 %
EOS (ABSOLUTE): 0.6 10*3/uL — ABNORMAL HIGH (ref 0.0–0.4)
Eos: 6 %
Hematocrit: 44.5 % (ref 37.5–51.0)
Hemoglobin: 14.9 g/dL (ref 13.0–17.7)
Immature Grans (Abs): 0 10*3/uL (ref 0.0–0.1)
Immature Granulocytes: 0 %
Lymphocytes Absolute: 3.1 10*3/uL (ref 0.7–3.1)
Lymphs: 33 %
MCH: 29.8 pg (ref 26.6–33.0)
MCHC: 33.5 g/dL (ref 31.5–35.7)
MCV: 89 fL (ref 79–97)
Monocytes Absolute: 0.9 10*3/uL (ref 0.1–0.9)
Monocytes: 9 %
Neutrophils Absolute: 4.7 10*3/uL (ref 1.4–7.0)
Neutrophils: 51 %
Platelets: 352 10*3/uL (ref 150–450)
RBC: 5 x10E6/uL (ref 4.14–5.80)
RDW: 13.6 % (ref 11.6–15.4)
WBC: 9.4 10*3/uL (ref 3.4–10.8)

## 2022-06-10 LAB — COMPREHENSIVE METABOLIC PANEL
ALT: 8 IU/L (ref 0–44)
AST: 13 IU/L (ref 0–40)
Albumin/Globulin Ratio: 1.7 (ref 1.2–2.2)
Albumin: 4.1 g/dL (ref 3.7–4.7)
Alkaline Phosphatase: 79 IU/L (ref 44–121)
BUN/Creatinine Ratio: 12 (ref 10–24)
BUN: 19 mg/dL (ref 8–27)
Bilirubin Total: 0.5 mg/dL (ref 0.0–1.2)
CO2: 27 mmol/L (ref 20–29)
Calcium: 9.2 mg/dL (ref 8.6–10.2)
Chloride: 103 mmol/L (ref 96–106)
Creatinine, Ser: 1.62 mg/dL — ABNORMAL HIGH (ref 0.76–1.27)
Globulin, Total: 2.4 g/dL (ref 1.5–4.5)
Glucose: 93 mg/dL (ref 70–99)
Potassium: 6 mmol/L — ABNORMAL HIGH (ref 3.5–5.2)
Sodium: 144 mmol/L (ref 134–144)
Total Protein: 6.5 g/dL (ref 6.0–8.5)
eGFR: 42 mL/min/{1.73_m2} — ABNORMAL LOW (ref >59)

## 2022-06-10 LAB — HEMOGLOBIN A1C
Est. average glucose Bld gHb Est-mCnc: 126 mg/dL
Hgb A1c MFr Bld: 6 % — ABNORMAL HIGH (ref 4.8–5.6)

## 2022-06-10 LAB — LIPID PANEL
Chol/HDL Ratio: 3.1 ratio (ref 0.0–5.0)
Cholesterol, Total: 134 mg/dL (ref 100–199)
HDL: 43 mg/dL (ref >39)
LDL Chol Calc (NIH): 72 mg/dL (ref 0–99)
Triglycerides: 101 mg/dL (ref 0–149)
VLDL Cholesterol Cal: 19 mg/dL (ref 5–40)

## 2022-06-10 LAB — PSA: Prostate Specific Ag, Serum: 0.4 ng/mL (ref 0.0–4.0)

## 2022-06-10 LAB — CARDIOVASCULAR RISK ASSESSMENT

## 2022-06-10 LAB — URIC ACID: Uric Acid: 6.2 mg/dL (ref 3.8–8.4)

## 2022-06-10 LAB — TSH: TSH: 3.97 u[IU]/mL (ref 0.450–4.500)

## 2022-06-10 NOTE — Progress Notes (Signed)
Kidney stage 3b, potassium high 6.0, continue lokelma every day, recheck potassium 2 weeks, liver tests normal, A1c 6.0, CBC normal lp

## 2022-06-11 ENCOUNTER — Other Ambulatory Visit: Payer: Self-pay

## 2022-06-11 DIAGNOSIS — E875 Hyperkalemia: Secondary | ICD-10-CM

## 2022-06-22 ENCOUNTER — Other Ambulatory Visit: Payer: Medicare PPO

## 2022-06-22 DIAGNOSIS — E875 Hyperkalemia: Secondary | ICD-10-CM | POA: Diagnosis not present

## 2022-06-22 LAB — COMPREHENSIVE METABOLIC PANEL
ALT: 8 IU/L (ref 0–44)
AST: 20 IU/L (ref 0–40)
Albumin/Globulin Ratio: 1.7 (ref 1.2–2.2)
Albumin: 4 g/dL (ref 3.7–4.7)
Alkaline Phosphatase: 70 IU/L (ref 44–121)
BUN/Creatinine Ratio: 12 (ref 10–24)
BUN: 20 mg/dL (ref 8–27)
Bilirubin Total: 0.3 mg/dL (ref 0.0–1.2)
CO2: 30 mmol/L — ABNORMAL HIGH (ref 20–29)
Calcium: 9.1 mg/dL (ref 8.6–10.2)
Chloride: 105 mmol/L (ref 96–106)
Creatinine, Ser: 1.62 mg/dL — ABNORMAL HIGH (ref 0.76–1.27)
Globulin, Total: 2.3 g/dL (ref 1.5–4.5)
Glucose: 97 mg/dL (ref 70–99)
Potassium: 4.8 mmol/L (ref 3.5–5.2)
Sodium: 148 mmol/L — ABNORMAL HIGH (ref 134–144)
Total Protein: 6.3 g/dL (ref 6.0–8.5)
eGFR: 42 mL/min/{1.73_m2} — ABNORMAL LOW (ref >59)

## 2022-06-23 NOTE — Progress Notes (Signed)
Kidney tests stage 3a, liver tests normal, potassium 4.8 normal lp

## 2022-07-12 IMAGING — US US RENAL
1 series · 14 of 25 positions shown · non-contrast
Comparison: Renal ultrasound 03/31/2021.  Fall the

CLINICAL DATA: Hydronephrosis.

EXAM:
RENAL / URINARY TRACT ULTRASOUND COMPLETE

[Series 1: us renal · 14 of 114 slices shown]
[im 1/114]
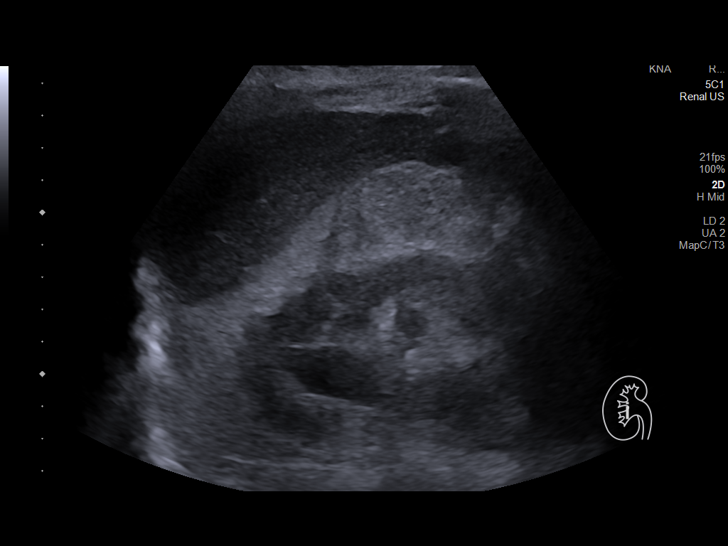
[im 10/114]
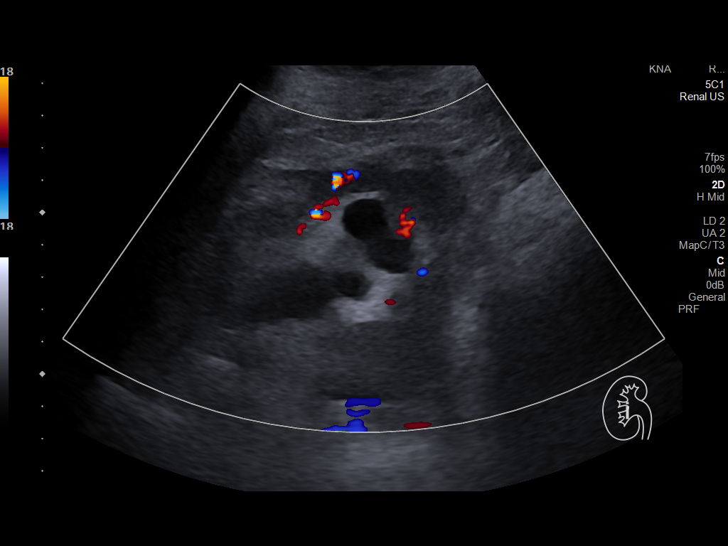
[im 19/114]
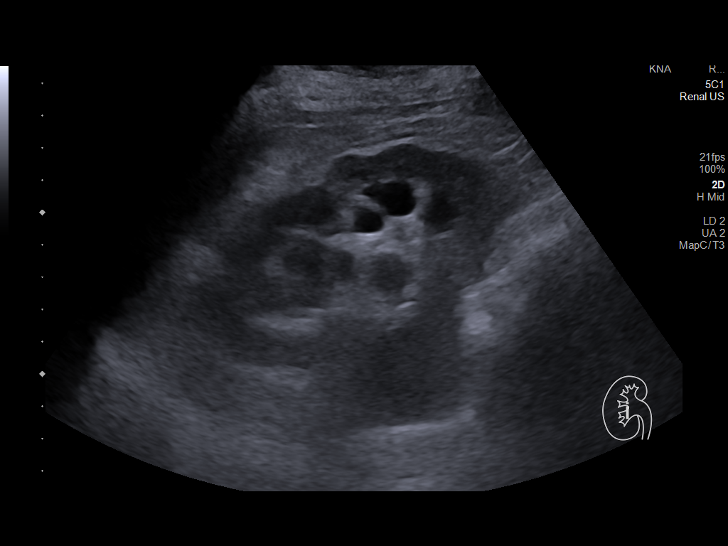
[im 29/114]
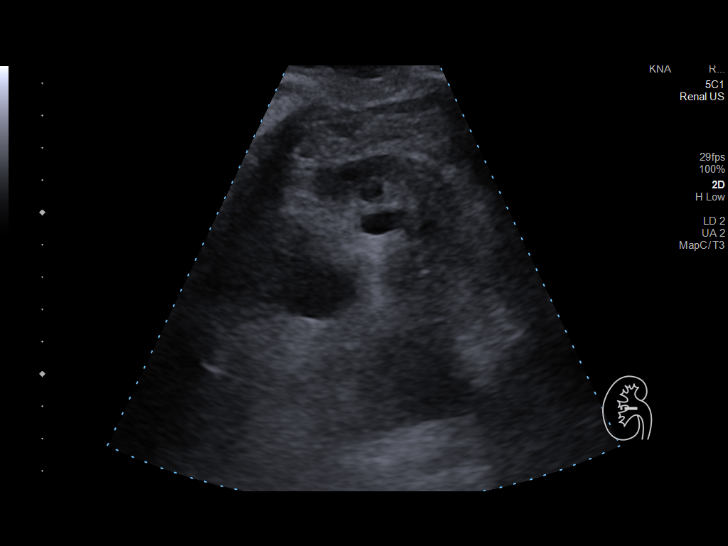
[im 38/114]
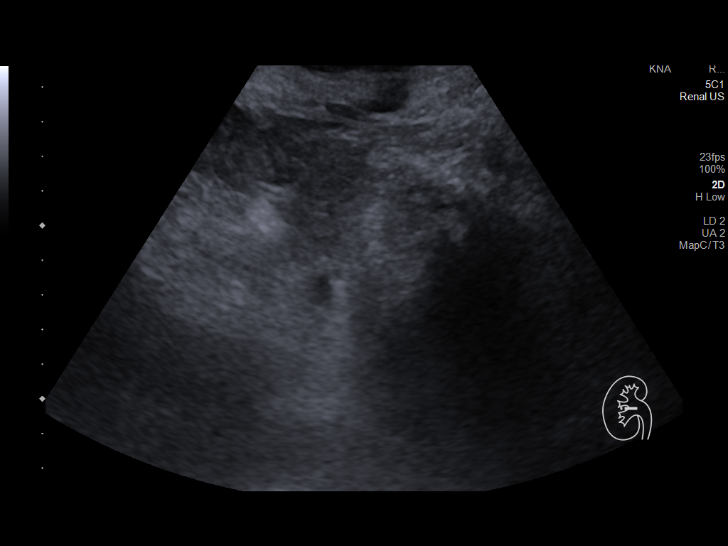
[im 43/114]
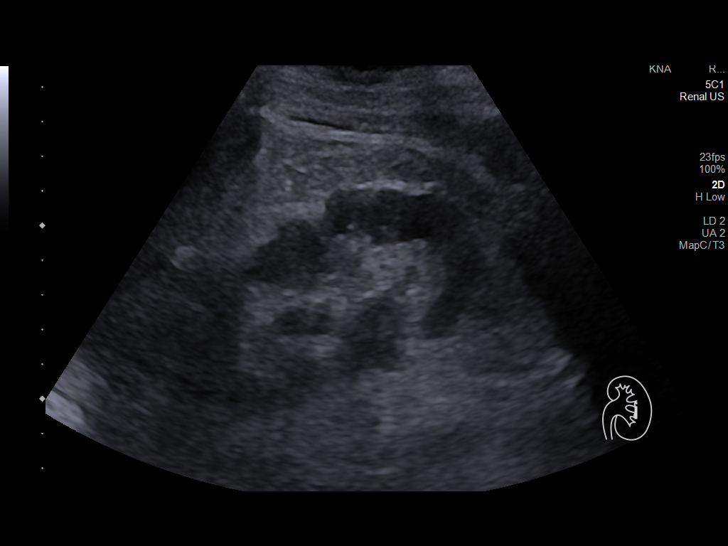
[im 52/114]
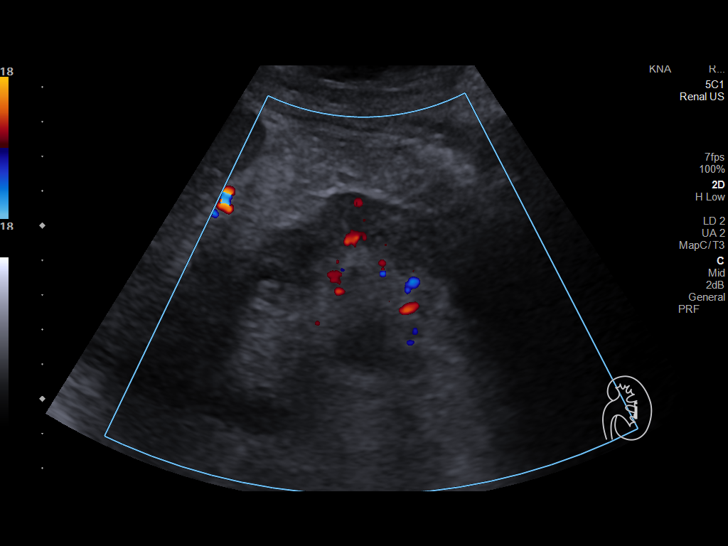
[im 62/114]
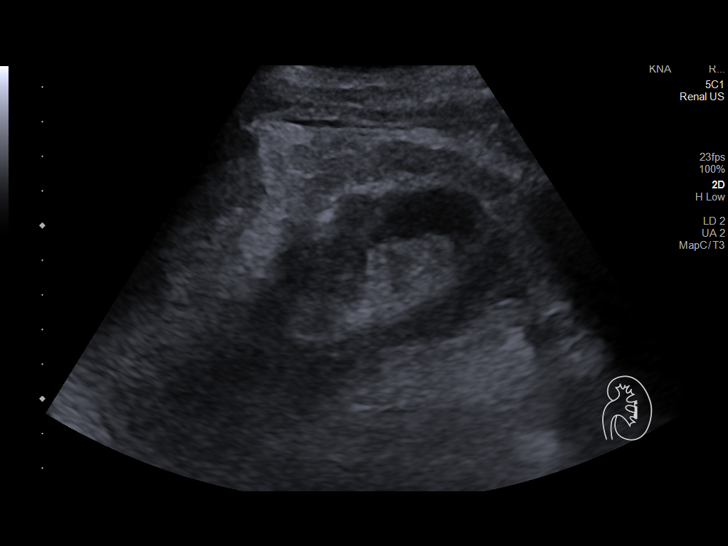
[im 71/114]
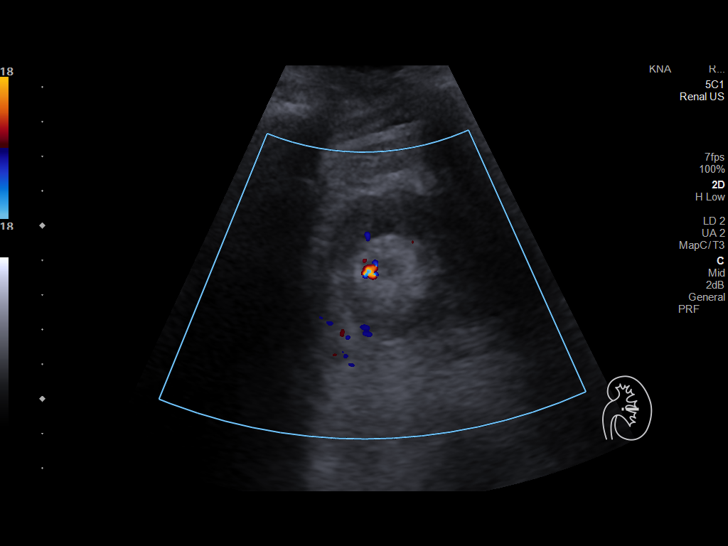
[im 76/114]
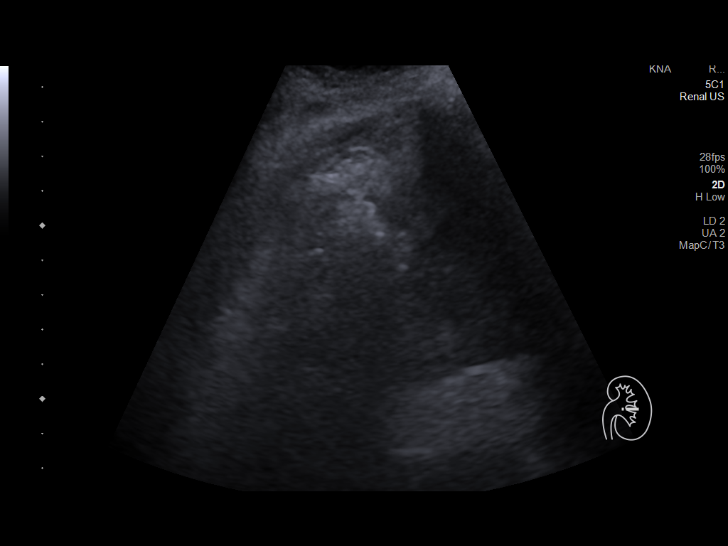
[im 85/114]
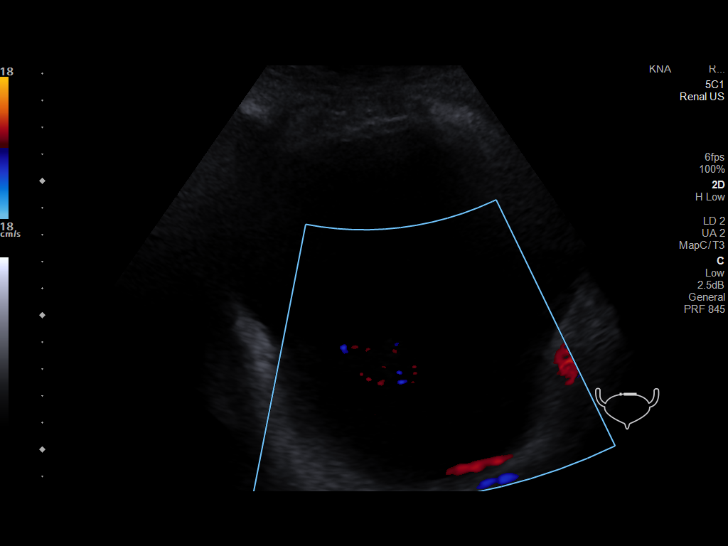
[im 95/114]
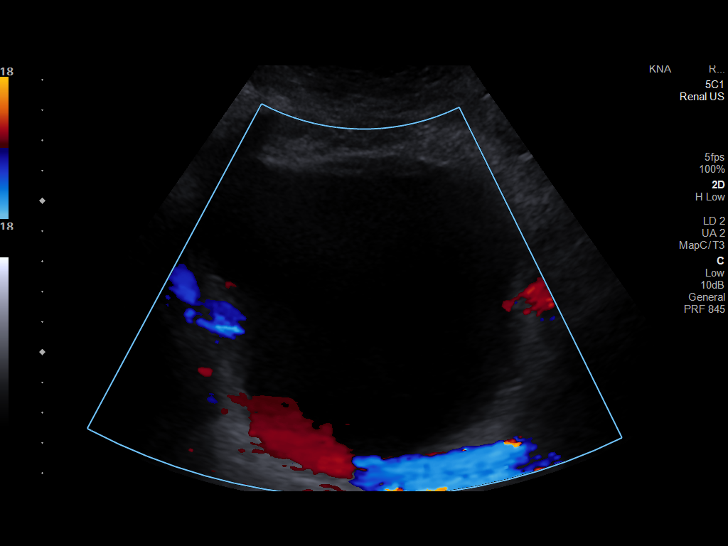
[im 104/114]
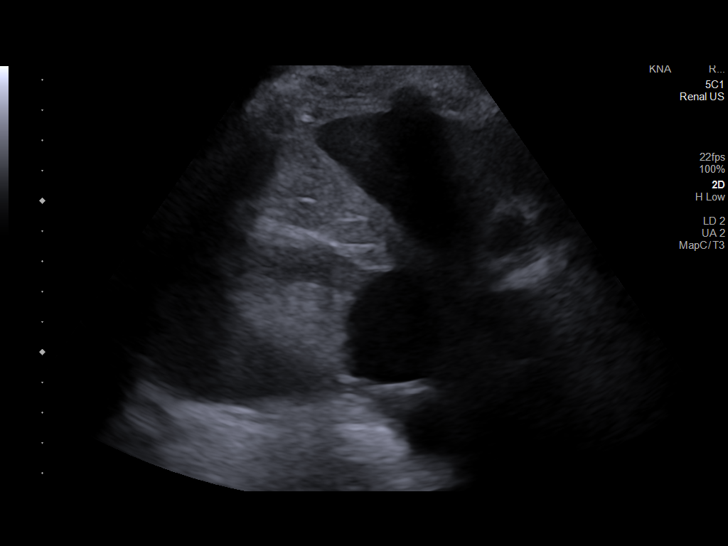
[im 114/114]
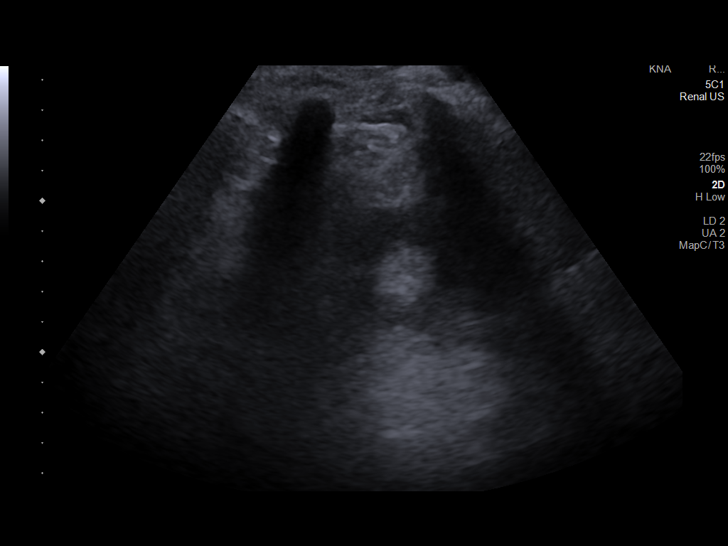

[14 of 25 positions shown; findings below may reference images not displayed]

FINDINGS: Right Kidney:

Renal measurements: 10.3 x 5.7 x 4.5 cm = volume: 137 mL. There is
increased echogenicity. This is similar to the prior study. There is
moderate hydronephrosis. There is a cyst in the inferior pole the
right kidney measuring 4.0 x 3.9 x 3.6 cm.

Left Kidney:

Renal measurements: 9.0 x 5.2 x 4.6 cm = volume: 111 mL. There is
increased renal echogenicity. There is mild left-sided
hydronephrosis. There is a 5 mm calculus in the superior pole.

Bladder:

651 cc postvoid residual.

Other:

None.
IMPRESSION: 1.  Moderate right and mild left hydronephrosis.

2. Increased bilateral renal echogenicity likely related to medical
renal disease.

3.  5 mm left renal calculus.

4.  Significant postvoid residual in the bladder.

## 2022-07-21 ENCOUNTER — Telehealth: Payer: Self-pay

## 2022-07-21 NOTE — Progress Notes (Signed)
    Chronic Care Management Pharmacy Assistant   Name: David Baxter  MRN: [id number] DOB: [birth year]   Reason for Encounter: Disease State call for DM    Recent office visits:  06/09/22 David Meeker MD. Seen for routine visit. D/C Gabapentin 100mg .   Recent consult visits:  None  Hospital visits:  None  Medications: Outpatient Encounter Medications as of 07/21/2022  Medication Sig   ACCU-CHEK AVIVA PLUS test strip USE TWICE DAILY AS DIRECTED   Accu-Chek FastClix Lancets MISC 1 each by Does not apply route in the morning and at bedtime. Use to check blood sugar twice daily as directed.   Alcohol Swabs (ALCOHOL PREP) 70 % PADS 1 each by Does not apply route daily.   allopurinol (ZYLOPRIM) 100 MG tablet TAKE 1 TABLET EVERY DAY   aspirin EC 81 MG tablet Take 81 mg by mouth daily.   bethanechol (URECHOLINE) 10 MG tablet Take 1 tablet (10 mg total) by mouth in the morning and at bedtime.   Blood Glucose Calibration (ACCU-CHEK AVIVA) SOLN 1 each by In Vitro route every 30 (thirty) days.   Cholecalciferol (VITAMIN D-1000 MAX ST) 25 MCG (1000 UT) tablet Take 2,000 Units by mouth.    COLCRYS 0.6 MG tablet Prn gout flares   finasteride (PROSCAR) 5 MG tablet Take 1 tablet (5 mg total) by mouth daily.   levothyroxine (SYNTHROID) 125 MCG tablet Take 1 tablet (125 mcg total) by mouth daily.   LOKELMA 10 g PACK packet Take 10 g by mouth daily.    meclizine (ANTIVERT) 25 MG tablet Take by mouth. Takes OTC prn dizziness.   metFORMIN (GLUCOPHAGE) 500 MG tablet TAKE 1 TABLET TWICE DAILY   metoprolol tartrate (LOPRESSOR) 25 MG tablet TAKE 1 TABLET TWICE DAILY   nitroGLYCERIN (NITROSTAT) 0.4 MG SL tablet Place 0.4 mg under the tongue every 5 (five) minutes as needed for chest pain.   simvastatin (ZOCOR) 40 MG tablet TAKE 1/2 TABLET EVERY DAY   sodium bicarbonate 650 MG tablet Take by mouth 2 (two) times daily.   vitamin B-12 (CYANOCOBALAMIN) 1000 MCG tablet Take 1,000 mcg by mouth daily.   No  facility-administered encounter medications on file as of 07/21/2022.    Recent Relevant Labs: Lab Results  Component Value Date/Time   HGBA1C 6.0 (H) 06/09/2022 09:54 AM   HGBA1C 5.7 (H) 01/26/2022 08:56 AM   MICROALBUR 80 12/23/2020 09:03 AM    Kidney Function Lab Results  Component Value Date/Time   CREATININE 1.62 (H) 06/22/2022 11:19 AM   CREATININE 1.62 (H) 06/09/2022 09:54 AM   GFRNONAA 43 (L) 08/19/2020 09:32 AM   GFRAA 50 (L) 08/19/2020 09:32 AM     Current antihyperglycemic regimen:  Metformin 500mg  twice daily   Adherence Review: Is the patient currently on a STATIN medication? Yes Is the patient currently on ACE/ARB medication? Yes Does the patient have >5 day gap between last estimated fill dates? David Baxter to review  Care Gaps: Last eye exam / Retinopathy Screening? 09/29/20 Last Annual Wellness Visit? None noted  Last Diabetic Foot Exam? 06/09/22   David Baxter, David Baxter Clinical Pharmacist Assistant  [phone]   Unable to reach pt after several attempts

## 2022-08-10 ENCOUNTER — Other Ambulatory Visit: Payer: Self-pay | Admitting: Legal Medicine

## 2022-08-10 DIAGNOSIS — E1121 Type 2 diabetes mellitus with diabetic nephropathy: Secondary | ICD-10-CM

## 2022-08-15 NOTE — Progress Notes (Unsigned)
Cardiology Office Note:    Date:  08/16/2022   ID:  David Baxter, David Baxter 13-Apr-1941, MRN [id number]  PCP:  Abigail Miyamoto, MD  Cardiologist:  Norman Herrlich, MD    Referring MD: Abigail Miyamoto,*    ASSESSMENT:    1. CAD in native artery   2. 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 Mixed hyperlipidemia   4. RBBB (right bundle branch block with left anterior fascicular block)    PLAN:    In order of problems listed above:  Overall he has done well no anginal comfort no edema shortness of breath palpitation or syncope.  This time I would continue his medical therapy including aspirin low-dose beta-blocker statin. His office blood pressure is moderately elevated asked his wife to get involved check his blood pressure several times a week trend them and bring them to office visits in the future to help make decisions about antihypertensive treatment goal in his case of previous orthostatic symptoms would be a systolic of 140. Stable CKD Continue his statin lipids are at target Caution his wife if she is getting heart rates less than 50 bpm at home or an episode of fainting to contact me with his bifascicular heart block   Next appointment: 1 year   Medication Adjustments/Labs and Tests Ordered: Current medicines are reviewed at length with the patient today.  Concerns regarding medicines are outlined above.  No orders of the defined types were placed in this encounter.  No orders of the defined types were placed in this encounter.   Chief complaint follow-up for  CAD  History of Present Illness:    David Baxter is a 81 y.o. male with a hx of CAD with PCI and 2 drug-eluting stents left circumflex coronary artery 2016 hypertension with stage III CKD and hyperkalemia orthostatic hypotension type 2 diabetes and hyperlipidemia last seen 09/25/2021.  Compliance with diet, lifestyle and medications: Yes  His wife is present  participates in the evaluation decision making.  He Has had a good year no chest pain edema shortness of breath palpitation or syncope and no further episode of orthostatic lightheadedness. He tolerates his statin without muscle pain or weakness Asked his wife to start checking his blood pressure several times a week at home record and bring to the office in the future. Past Medical History:  Diagnosis Date   Acquired hypothyroidism 04/08/2018   Acute retention of urine 04/09/2018   Acute sinusitis 06/05/2020   AKI (acute kidney injury) (HCC) 04/08/2018   Arthralgia of both lower legs    BMI 23.0-23.9, adult 04/21/2020   BPH (benign prostatic hyperplasia)    BPH with urinary obstruction 12/05/2019   CAD (coronary artery disease)    cardiologist--  dr Dulce Sellar w/ [facility] in [place]   CKD (chronic kidney disease) stage 3, GFR 30-59 ml/min (HCC) 07/06/2015   CKD (chronic kidney disease), stage III (HCC)    Coronary artery disease involving coronary bypass graft of native heart 04/09/2015   Overview:  1. DES OM 2012    2. 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;  Service: Urology;  Laterality: N/A;   EXTRACORPOREAL SHOCK WAVE LITHOTRIPSY  1990's   PERCUTANEOUS NEPHROSTOLITHOTOMY  1991   and abdominal hernia repair   URETEROLITHOTOMY  1991 and 1994    Current Medications: Current Meds  Medication Sig   ACCU-CHEK AVIVA PLUS test strip USE TWICE DAILY AS DIRECTED   Accu-Chek FastClix Lancets MISC 1 each by Does not apply route in the morning and at bedtime. Use to check blood sugar twice daily as directed.   Alcohol Swabs (ALCOHOL PREP) 70 % PADS 1 each by Does not apply route daily.   allopurinol (ZYLOPRIM) 100 MG tablet TAKE 1 TABLET EVERY DAY   aspirin EC 81 MG tablet Take 81 mg by mouth daily.   bethanechol (URECHOLINE) 10 MG tablet Take 1 tablet (10 mg total) by mouth in the morning and at bedtime.   Blood Glucose Calibration (ACCU-CHEK AVIVA) SOLN 1 each by In Vitro route every 30 (thirty) days.   Cholecalciferol (VITAMIN D-1000 MAX ST) 25 MCG (1000 UT) tablet Take 2,000 Units by mouth.    COLCRYS 0.6 MG tablet Prn gout flares   finasteride (PROSCAR) 5 MG tablet Take 1 tablet (5 mg total) by mouth daily.   levothyroxine (SYNTHROID) 125 MCG tablet Take 1 tablet (125 mcg total) by mouth daily.   LOKELMA 10 g PACK packet Take 10 g by mouth daily.    meclizine (ANTIVERT) 25 MG tablet Take by mouth. Takes OTC prn dizziness.   metFORMIN (GLUCOPHAGE) 500 MG tablet TAKE 1 TABLET TWICE DAILY   metoprolol tartrate (LOPRESSOR) 25 MG tablet TAKE 1 TABLET TWICE DAILY   nitroGLYCERIN (NITROSTAT) 0.4 MG SL tablet Place 0.4 mg under the tongue every 5 (five) minutes as needed for chest pain.   simvastatin (ZOCOR) 40 MG tablet TAKE 1/2 TABLET EVERY DAY   sodium bicarbonate 650 MG tablet Take by mouth 2 (two) times daily.   vitamin B-12 (CYANOCOBALAMIN)  1000 MCG tablet Take 1,000 mcg by mouth daily.     Allergies:   Tamsulosin and Aspirin   Social History   Socioeconomic History   Marital status: Married    Spouse name: Not on file   Number of children: Not on file   Years of education: Not on file   Highest education level: Not on file  Occupational History   Occupation: truck driver  Tobacco Use   Smoking status: Never    Passive exposure: Never   Smokeless tobacco: Never  Vaping Use   Vaping Use: Never used  Substance and Sexual Activity   Alcohol use: No   Drug use: No   Sexual activity: Yes    Partners: Female  Other Topics Concern  Not on file  Social History Narrative   Not on file   Social Determinants of Health   Financial Resource Strain: High Risk (01/28/2022)   Overall Financial Resource Strain (CARDIA)    Difficulty of Paying Living Expenses: Hard  Food Insecurity: No Food Insecurity (06/11/2020)   Hunger Vital Sign    Worried About Running Out of Food in the Last Year: Never true    Ran Out of Food in the Last Year: Never true  Transportation Needs: No Transportation Needs (01/28/2022)   PRAPARE - Hydrologist (Medical): No    Lack of Transportation (Non-Medical): No  Physical Activity: Not on file  Stress: Not on file  Social Connections: Not on file     Family History: The patient's family history includes Heart disease (age of onset: 73) in his mother; Heart disease (age of onset: 72) in his father; Hypertension in his father. ROS:   Please see the history of present illness.    All other systems reviewed and are negative.  EKGs/Labs/Other Studies Reviewed:    The following studies were reviewed today:  EKG:  EKG ordered today and personally reviewed.  The ekg ordered today demonstrates bifascicular heart block first-degree AV block compared to a year ago right bundle branch block is new  Recent Labs: 06/09/2022: Hemoglobin 14.9; Platelets 352; TSH  3.970 06/22/2022: ALT 8; BUN 20; Creatinine, Ser 1.62; Potassium 4.8; Sodium 148  Recent Lipid Panel    Component Value Date/Time   CHOL 134 06/09/2022 0954   TRIG 101 06/09/2022 0954   HDL 43 06/09/2022 0954   CHOLHDL 3.1 06/09/2022 0954   LDLCALC 72 06/09/2022 0954    Physical Exam:    VS:  BP (!) 156/88 (BP Location: Right Arm, Patient Position: Sitting, Cuff Size: Normal)   Pulse 62   Ht 5\' 9"  (1.753 m)   Wt 171 lb 3.2 oz (77.7 kg)   SpO2 99%   BMI 25.28 kg/m     Wt Readings from Last 3 Encounters:  08/16/22 171 lb 3.2 oz (77.7 kg)  06/09/22 166 lb (75.3 kg)  01/26/22 169 lb 3.2 oz (76.7 kg)     GEN:  Well nourished, well developed in no acute distress HEENT: Normal NECK: No JVD; No carotid bruits LYMPHATICS: No lymphadenopathy CARDIAC: RRR, no murmurs, rubs, gallops RESPIRATORY:  Clear to auscultation without rales, wheezing or rhonchi  ABDOMEN: Soft, non-tender, non-distended MUSCULOSKELETAL:  No edema; No deformity  SKIN: Warm and dry NEUROLOGIC:  Alert and oriented x 3 PSYCHIATRIC:  Normal affect    Signed, Shirlee More, MD  08/16/2022 11:36 AM    [place]

## 2022-08-16 ENCOUNTER — Ambulatory Visit: Payer: Medicare PPO | Admitting: Cardiology

## 2022-08-16 ENCOUNTER — Encounter: Payer: Self-pay | Admitting: Cardiology

## 2022-08-16 VITALS — BP 156/88 | HR 62 | Ht 69.0 in | Wt 171.2 lb

## 2022-08-16 DIAGNOSIS — I129 Hypertensive chronic kidney disease with stage 1 through stage 4 chronic kidney disease, or unspecified chronic kidney disease: Secondary | ICD-10-CM | POA: Diagnosis not present

## 2022-08-16 DIAGNOSIS — E782 Mixed hyperlipidemia: Secondary | ICD-10-CM | POA: Diagnosis not present

## 2022-08-16 DIAGNOSIS — N1832 Chronic kidney disease, stage 3b: Secondary | ICD-10-CM

## 2022-08-16 DIAGNOSIS — I452 Bifascicular block: Secondary | ICD-10-CM | POA: Diagnosis not present

## 2022-08-16 DIAGNOSIS — I2510 Atherosclerotic heart disease of native coronary artery without angina pectoris: Secondary | ICD-10-CM | POA: Diagnosis not present

## 2022-08-16 NOTE — Patient Instructions (Addendum)
[facility]  Tips to measure your blood pressure correctly  To determine whether you have hypertension, a medical professional will take a blood pressure reading. How you prepare for the test, the position of your arm, and other factors can change a blood pressure reading by 10% or more. That could be enough to hide high blood pressure, start you on a drug you don't really need, or lead your doctor to incorrectly adjust your medications. National and international guidelines offer specific instructions for measuring blood pressure. If a doctor, nurse, or medical assistant isn't doing it right, don't hesitate to ask him or her to get with the guidelines. Here's what you can do to ensure a correct reading:  Don't drink a caffeinated beverage or smoke during the 30 minutes before the test.  Sit quietly for five minutes before the test begins.  During the measurement, sit in a chair with your feet on the floor and your arm supported so your elbow is at about heart level.  The inflatable part of the cuff should completely cover at least 80% of your upper arm, and the cuff should be placed on bare skin, not over a shirt.  Don't talk during the measurement.  Have your blood pressure measured twice, with a brief break in between. If the readings are different by 5 points or more, have it done a third time. There are times to break these rules. If you sometimes feel lightheaded when getting out of bed in the morning or when you stand after sitting, you should have your blood pressure checked while seated and then while standing to see if it falls from one position to the next. Because blood pressure varies throughout the day, your doctor will rarely diagnose hypertension on the basis of a single reading. Instead, he or she will want to confirm the measurements on at least two occasions, usually within a few weeks of one another. The exception to this rule is if you have a blood pressure reading of 180/110 mm Hg  or higher. A result this high usually calls for prompt treatment. It's also a good idea to have your blood pressure measured in both arms at least once, since the reading in one arm (usually the right) may be higher than that in the left. A 2014 study in The American Journal of Medicine of nearly 3,400 people found average arm- to-arm differences in systolic blood pressure of about 5 points. The higher number should be used to make treatment decisions. In 2017, new guidelines from the [place], the [employer] of Cardiology, and nine other health organizations lowered the diagnosis of high blood pressure to 130/80 mm Hg or higher for all adults. The guidelines also redefined the various blood pressure categories to now include normal, elevated, Stage 1 hypertension, Stage 2 hypertension, and hypertensive crisis (see "Blood pressure categories"). Blood pressure categories  Blood pressure category SYSTOLIC (upper number)  DIASTOLIC (lower number)  Normal Less than 120 mm Hg and Less than 80 mm Hg  Elevated 120-129 mm Hg and Less than 80 mm Hg  High blood pressure: Stage 1 hypertension 130-139 mm Hg or 80-89 mm Hg  High blood pressure: Stage 2 hypertension 140 mm Hg or higher or 90 mm Hg or higher  Hypertensive crisis (consult your doctor immediately) Higher than 180 mm Hg and/or Higher than 120 mm Hg  Source: American Heart Association and American Stroke Association. For more on getting your blood pressure under control, buy Controlling Your Blood  Pressure, a Special Health Report from [facility].   Contact me if home HR < 50 BPM or episode of faintingMedication Instructions:  Your physician recommends that you continue on your current medications as directed. Please refer to the Current Medication list given to you today.  *If you need a refill on your cardiac medications before your next appointment, please call your pharmacy*   Lab Work: NONE If you have  labs (blood work) drawn today and your tests are completely normal, you will receive your results only by: [place] (if you have MyChart) OR A paper copy in the mail If you have any lab test that is abnormal or we need to change your treatment, we will call you to review the results.   Testing/Procedures: NONE   Follow-Up: At [facility], you and your health needs are our priority.  As part of our continuing mission to provide you with exceptional heart care, we have created designated Provider Care Teams.  These Care Teams include your primary Cardiologist (physician) and Advanced Practice Providers (APPs -  Physician Assistants and Nurse Practitioners) who all work together to provide you with the care you need, when you need it.  We recommend signing up for the patient portal called "MyChart".  Sign up information is provided on this After Visit Summary.  MyChart is used to connect with patients for Virtual Visits (Telemedicine).  Patients are able to view lab/test results, encounter notes, upcoming appointments, etc.  Non-urgent messages can be sent to your provider as well.   To learn more about what you can do with MyChart, go to [email or web address].    Your next appointment:   1 year(s)  The format for your next appointment:   In Person  Provider:   Shirlee More, MD    Other Instructions Check and Record BP several times a week  Important Information About Sugar

## 2022-08-17 ENCOUNTER — Ambulatory Visit (HOSPITAL_COMMUNITY)
Admission: RE | Admit: 2022-08-17 | Discharge: 2022-08-17 | Disposition: A | Discharge status: Home / Self Care | Payer: Medicare PPO | Source: Ambulatory Visit | Attending: Urology | Admitting: Urology

## 2022-08-17 DIAGNOSIS — N1832 Chronic kidney disease, stage 3b: Secondary | ICD-10-CM | POA: Insufficient documentation

## 2022-08-17 DIAGNOSIS — I452 Bifascicular block: Secondary | ICD-10-CM | POA: Diagnosis not present

## 2022-08-17 DIAGNOSIS — E782 Mixed hyperlipidemia: Secondary | ICD-10-CM | POA: Diagnosis not present

## 2022-08-17 DIAGNOSIS — I2510 Atherosclerotic heart disease of native coronary artery without angina pectoris: Secondary | ICD-10-CM | POA: Insufficient documentation

## 2022-08-17 DIAGNOSIS — N1330 Unspecified hydronephrosis: Secondary | ICD-10-CM | POA: Diagnosis not present

## 2022-08-17 DIAGNOSIS — I129 Hypertensive chronic kidney disease with stage 1 through stage 4 chronic kidney disease, or unspecified chronic kidney disease: Secondary | ICD-10-CM | POA: Diagnosis not present

## 2022-08-18 ENCOUNTER — Ambulatory Visit (INDEPENDENT_AMBULATORY_CARE_PROVIDER_SITE_OTHER): Payer: Medicare PPO

## 2022-08-18 DIAGNOSIS — I10 Essential (primary) hypertension: Secondary | ICD-10-CM

## 2022-08-18 DIAGNOSIS — E039 Hypothyroidism, unspecified: Secondary | ICD-10-CM

## 2022-08-18 DIAGNOSIS — E1169 Type 2 diabetes mellitus with other specified complication: Secondary | ICD-10-CM

## 2022-08-18 NOTE — Progress Notes (Signed)
Chronic Care Management Pharmacy Note  08/18/2022 Name:  David Baxter MRN:  [id number] DOB:  [birth year]  Recommendations to PCP:  Please send Simvastatin 62m as opposed to Simvastatin 444mtake 1/2 QD AWV needed Eye Exam needed  Subjective: David Baxter an [age].o. year old male who is a primary patient of PeHenrene PastorLaZeb ComfortMD.  The CCM team was consulted for assistance with disease management and care coordination needs.    Engaged with patient by telephone for follow up visit in response to provider referral for pharmacy case management and/or care coordination services.   Consent to Services:  The patient was given information about Chronic Care Management services, agreed to services, and gave verbal consent prior to initiation of services.  Please see initial visit note for detailed documentation.   Patient Care Team: PeLillard AnesMD as PCP - General (Family Medicine) KeLane HackerRPSharp [facility]Pharmacist) AdJolee EwingMD (Specialist)  Recent office visits:  06/09/22 PeReinaldo MeekerD. Seen for routine visit. D/C Gabapentin 10030m   Recent consult visits:  None   Hospital visits:  None  Objective:  Lab Results  Component Value Date   CREATININE 1.62 (H) 06/22/2022   BUN 20 06/22/2022   GFRNONAA 43 (L) 08/19/2020   GFRAA 50 (L) 08/19/2020   NA 148 (H) 06/22/2022   K 4.8 06/22/2022   CALCIUM 9.1 06/22/2022   CO2 30 (H) 06/22/2022    Lab Results  Component Value Date/Time   HGBA1C 6.0 (H) 06/09/2022 09:54 AM   HGBA1C 5.7 (H) 01/26/2022 08:56 AM   MICROALBUR 80 12/23/2020 09:03 AM    Last diabetic Eye exam:  Lab Results  Component Value Date/Time   HMDIABEYEEXA No Retinopathy 09/29/2020 12:00 AM    Last diabetic Foot exam: No results found for: "HMDIABFOOTEX"   Lab Results  Component Value Date   CHOL 134 06/09/2022   HDL 43 06/09/2022   LDLCALC 72 06/09/2022   TRIG 101 06/09/2022   CHOLHDL 3.1 06/09/2022        Latest Ref Rng & Units 06/22/2022   11:19 AM 06/09/2022    9:54 AM 01/26/2022    8:56 AM  Hepatic Function  Total Protein 6.0 - 8.5 g/dL 6.3  6.5  6.7   Albumin 3.7 - 4.7 g/dL 4.0  4.1  3.8   AST 0 - 40 IU/L '20  13  14   ' ALT 0 - 44 IU/L '8  8  7   ' Alk Phosphatase 44 - 121 IU/L 70  79  75   Total Bilirubin 0.0 - 1.2 mg/dL 0.3  0.5  0.3     Lab Results  Component Value Date/Time   TSH 3.970 06/09/2022 09:54 AM   TSH 11.500 (H) 01/26/2022 08:56 AM       Latest Ref Rng & Units 06/09/2022    9:54 AM 01/26/2022    8:56 AM 09/22/2021    9:15 AM  CBC  WBC 3.4 - 10.8 x10E3/uL 9.4  9.1  9.5   Hemoglobin 13.0 - 17.7 g/dL 14.9  13.8  14.4   Hematocrit 37.5 - 51.0 % 44.5  42.8  43.5   Platelets 150 - 450 x10E3/uL 352  327  362     No results found for: "VD25OH"  Clinical ASCVD: Yes  The ASCVD Risk score (Arnett DK, et al., 2019) failed to calculate for the following reasons:   The 2019 ASCVD risk score is only valid for ages 4030  to 79       01/26/2022    8:08 AM 09/22/2021    8:24 AM 05/11/2020    7:00 PM  Depression screen PHQ 2/9  Decreased Interest 0 0 0  Down, Depressed, Hopeless 0 0 0  PHQ - 2 Score 0 0 0     Social History   Tobacco Use  Smoking Status Never   Passive exposure: Never  Smokeless Tobacco Never   BP Readings from Last 3 Encounters:  08/16/22 (!) 156/88  06/09/22 120/80  02/19/22 (!) 167/87   Pulse Readings from Last 3 Encounters:  08/16/22 62  06/09/22 (!) 58  02/19/22 67   Wt Readings from Last 3 Encounters:  08/16/22 171 lb 3.2 oz (77.7 kg)  06/09/22 166 lb (75.3 kg)  01/26/22 169 lb 3.2 oz (76.7 kg)    Assessment/Interventions: Review of patient past medical history, allergies, medications, health status, including review of consultants reports, laboratory and other test data, was performed as part of comprehensive evaluation and provision of chronic care management services.   SDOH:  (Social Determinants of Health) assessments and interventions  performed: Yes SDOH Interventions    Flowsheet Row Chronic Care Management from 08/18/2022 in [place] Management from 01/28/2022 in [place] Interventions    Transportation Interventions Intervention Not Indicated Intervention Not Indicated  Financial Strain Interventions Other (Comment)  [PAP] Other (Comment)  David Baxter PAP]       CCM Care Plan  Allergies  Allergen Reactions   Tamsulosin Other (See Comments)   Aspirin Other (See Comments)    Upset stomach    Medications Reviewed Today     Reviewed by David Baxter, [facility] (Pharmacist) on 08/18/22 at 1010  Med List Status: <None>   Medication Order Taking? Sig Documenting Provider Last Dose Status Informant  ACCU-CHEK AVIVA PLUS test strip [id number] No USE TWICE DAILY AS DIRECTED Lillard Anes, MD Taking Active   Accu-Chek FastClix Lancets MISC [id number] No 1 each by Does not apply route in the morning and at bedtime. Use to check blood sugar twice daily as directed. Lillard Anes, MD Taking Active   Alcohol Swabs (ALCOHOL PREP) 70 % PADS [id number] No 1 each by Does not apply route daily. Lillard Anes, MD Taking Active   allopurinol (ZYLOPRIM) 100 MG tablet [id number] No TAKE 1 TABLET EVERY DAY Lillard Anes, MD Taking Active   aspirin EC 81 MG tablet [id number] No Take 81 mg by mouth daily. [provider] Taking Active   bethanechol (URECHOLINE) 10 MG tablet [id number] No Take 1 tablet (10 mg total) by mouth in the morning and at bedtime. McKenzie, Candee Furbish, MD Taking Active   Blood Glucose Calibration (ACCU-CHEK AVIVA) SOLN [id number] No 1 each by In Vitro route every 30 (thirty) days. Lillard Anes, MD Taking Active   Cholecalciferol (VITAMIN D-1000 MAX ST) 25 MCG (1000 UT) tablet [id number] No Take 2,000 Units by mouth.  [provider] Taking Active   COLCRYS 0.6 MG tablet [id number] No Prn gout flares [provider]  Taking Active Self  finasteride (PROSCAR) 5 MG tablet [id number] No Take 1 tablet (5 mg total) by mouth daily. McKenzie, Candee Furbish, MD Taking Active   levothyroxine (SYNTHROID) 125 MCG tablet [id number] No Take 1 tablet (125 mcg total) by mouth daily. Lillard Anes, MD Taking Active   LOKELMA 10 g PACK packet [id number] No Take 10 g by mouth daily.  [provider] Taking  Active Self  meclizine (ANTIVERT) 25 MG tablet [id number] No Take by mouth. Takes OTC prn dizziness. [provider] Taking Active Self  metFORMIN (GLUCOPHAGE) 500 MG tablet [id number] No TAKE 1 TABLET TWICE DAILY Lillard Anes, MD Taking Active   metoprolol tartrate (LOPRESSOR) 25 MG tablet [id number] No TAKE 1 TABLET TWICE DAILY Lillard Anes, MD Taking Active   nitroGLYCERIN (NITROSTAT) 0.4 MG SL tablet [id number] No Place 0.4 mg under the tongue every 5 (five) minutes as needed for chest pain. [provider] Taking Active   simvastatin (ZOCOR) 40 MG tablet [id number] No TAKE 1/2 TABLET EVERY DAY Lillard Anes, MD Taking Active   sodium bicarbonate 650 MG tablet [id number] No Take by mouth 2 (two) times daily. [provider] Taking Active   vitamin B-12 (CYANOCOBALAMIN) 1000 MCG tablet [id number] No Take 1,000 mcg by mouth daily. [provider] Taking Active             Patient Active Problem List   Diagnosis Date Noted   Malnutrition of mild degree ([place]) 04/29/2021   Hydronephrosis 03/31/2021   Hearing loss 09/23/2020   H/O: gout 09/23/2020   Type 2 diabetes mellitus with other specified complication ([place]) 62/83/6629   Personal history of urinary calculi 09/23/2020   History of cerebrovascular accident 09/23/2020   Presence of coronary angioplasty implant and graft 09/23/2020   S/P drug eluting coronary stent placement    Hyperlipidemia    Arthralgia of both lower legs    Hyperkalemia 05/09/2020   Secondary hyperparathyroidism, non-renal  ([place]) 12/05/2019   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   Benign prostatic hyperplasia 12/05/2019   Acquired hypothyroidism 04/08/2018   SIRS (systemic inflammatory response syndrome) ([place]) 04/08/2018   Gout 04/08/2018   Chronic kidney disease, stage 3 unspecified ([place]) 07/06/2015   Coronary artery disease of bypass graft of native heart with stable angina pectoris ([place]) 04/09/2015   Essential hypertension 04/09/2015    Immunization History  Administered Date(s) Administered   Fluad Quad(high Dose 65+) 08/19/2020, 09/22/2021   Moderna Covid-19 Vaccine Bivalent Booster 65yr & up 09/22/2021   Moderna SARS-COV2 Booster Vaccination 08/19/2020   MManorSars-Covid-2 Vaccination 11/22/2019, 12/18/2019    Conditions to be addressed/monitored:  Hypertension, Hyperlipidemia, Diabetes, Chronic Kidney Disease and Hypothyroidism  Care Plan : [place] Updates made by KLane Baxter RPH since 08/18/2022 12:00 AM     Problem: dm, htn, hld, cad   Priority: High  Onset Date: 12/12/2020     Long-Range Goal: Disease Management   Start Date: 12/12/2020  Expected End Date: 12/12/2021  Recent Progress: On track  Priority: High  Note:   Current Barriers:  Unable to independently afford treatment regimen  Pharmacist Clinical Goal(s):  Over the next 90 days, patient will verbalize ability to afford treatment regimen maintain control of diabetes as evidenced by a1c  through collaboration with PharmD and provider.   Interventions: 1:1 collaboration with PLillard Anes MD regarding development and update of comprehensive plan of care as evidenced by provider attestation and co-signature Inter-disciplinary care team collaboration (see longitudinal plan of care) Comprehensive medication review performed; medication list updated in electronic medical record  Hypertension (BP goal <130/80) -Controlled -Current treatment: Metoprolol tartrate 25 mg bid Appropriate,  Effective, Safe, Accessible -Medications previously tried: none noted -Current home readings:  October 2023: Patient not testing. Cardio asked them to start at appt this week. They agreed to start today -Current dietary habits: low potassium diet. Trying to increase intake to  avoid additional weight loss.  -Current exercise habits: stays active around his house and walking daughter's dog -Denies hypotensive/hypertensive symptoms -Educated on BP goals and benefits of medications for prevention of heart attack, stroke and kidney damage; Daily salt intake goal < 2300 mg; Exercise goal of 150 minutes per week; Importance of home blood pressure monitoring; -Counseled to monitor BP at home weekly, document, and provide log at future appointments -Counseled on diet and exercise extensively Recommended to continue current medication  Hyperlipidemia: (LDL goal < 55) The ASCVD Risk score (Arnett DK, et al., 2019) failed to calculate for the following reasons:   The 2019 ASCVD risk score is only valid for ages 76 to 32 Lab Results  Component Value Date   CHOL 134 06/09/2022   CHOL 125 01/26/2022   CHOL 134 09/22/2021   Lab Results  Component Value Date   HDL 43 06/09/2022   HDL 53 01/26/2022   HDL 48 09/22/2021   Lab Results  Component Value Date   LDLCALC 72 06/09/2022   LDLCALC 59 01/26/2022   LDLCALC 73 09/22/2021   Lab Results  Component Value Date   TRIG 101 06/09/2022   TRIG 57 01/26/2022   TRIG 61 09/22/2021   Lab Results  Component Value Date   CHOLHDL 3.1 06/09/2022   CHOLHDL 2.4 01/26/2022   CHOLHDL 2.8 09/22/2021  No results found for: "LDLDIRECT" -Not ideally controlled -Current treatment: Simvastatin 40 mg - 1/2 tablet daily Query Appropriate,  -Medications previously tried: none reported  -Current dietary patterns: low potassium diet.  -Current exercise habits: travels frequently to visit family. Works around his home and walks with dog..  -Educated on  Cholesterol goals;  Benefits of statin for ASCVD risk reduction; Importance of limiting foods high in cholesterol; Exercise goal of 150 minutes per week; -Counseled on diet and exercise extensively April 2023: Will ask PCP to send in Simvastatin 68m QD October 2023: Will ask PCP to send in Simvastatin 252mQD  Diabetes (A1c goal <7%) Lab Results  Component Value Date   HGBA1C 6.0 (H) 06/09/2022   HGBA1C 5.7 (H) 01/26/2022   HGBA1C 6.0 (H) 09/22/2021   Lab Results  Component Value Date   MICROALBUR 80 12/23/2020   LDLCALC 72 06/09/2022   CREATININE 1.62 (H) 06/22/2022   Lab Results  Component Value Date   NA 148 (H) 06/22/2022   K 4.8 06/22/2022   CREATININE 1.62 (H) 06/22/2022   EGFR 42 (L) 06/22/2022   GFRNONAA 43 (L) 08/19/2020   GLUCOSE 97 06/22/2022   Lab Results  Component Value Date   WBC 9.4 06/09/2022   HGB 14.9 06/09/2022   HCT 44.5 06/09/2022   MCV 89 06/09/2022   PLT 352 06/09/2022  -Controlled -Current medications: Metformin 500 mg bid Appropriate, Effective, Safe, Accessible -Medications previously tried: none reported  -Current home glucose readings fasting glucose:  October 2023: Not writing down values post prandial glucose: none reported -Denies hypoglycemic/hyperglycemic symptoms -Current meal patterns:  Eats a variety of fruits and vegetables. Limits potassium in diet due to kidney. Trying to increase protein.  -Current exercise: stays active around home.  -Educated onA1c and blood sugar goals; Complications of diabetes including kidney damage, retinal damage, and cardiovascular disease; Exercise goal of 150 minutes per week; Benefits of routine self-monitoring of blood sugar; -Counseled to check feet daily and get yearly eye exams -Counseled on diet and exercise extensively -No ACE/ARB due to K+ Recommended to continue current medication  Hypothyroidism (Goal: manage TSH) Lab Results  Component Value Date   TSH 3.970 06/09/2022   -Controlled -Current treatment  Levothyroxine 100 mcg daily Appropriate, Effective, Safe, Accessible -Medications previously tried: none reported  -Recommended to continue current medication  Chronic Kidney Disease Stage 3B  -Managed by Dr. Alyson Ingles Lab Results  Component Value Date   CREATININE 1.62 (H) 06/22/2022   BUN 20 06/22/2022   NA 148 (H) 06/22/2022   K 4.8 06/22/2022   CL 105 06/22/2022   CO2 30 (H) 06/22/2022   Lab Results  Component Value Date   NA 148 (H) 06/22/2022   K 4.8 06/22/2022   CREATININE 1.62 (H) 06/22/2022   EGFR 42 (L) 06/22/2022   GFRNONAA 43 (L) 08/19/2020   GLUCOSE 97 06/22/2022  -Current treatment  Lokelma 10g QD (PAP) Appropriate, Effective, Safe, Accessible Sodium Bicarbonate 663m BID Appropriate, Effective, Safe, Accessible October 2023: Will redo PAP for 2024 -Recommended to continue current medication  Gout (Goal: Prevent gout flares) -Controlled -Last Gout Flare: July of 2020 per patient -Current treatment  Allopurinol 1041mAppropriate, Effective, Safe, Accessible Colchicine 0.27m4mrn Appropriate, Effective, Safe, Accessible -Medications previously tried: N/A  -Recommended to continue current medication   Patient Goals/Self-Care Activities Over the next 90 days, patient will:  - take medications as prescribed focus on medication adherence by using pill box check glucose daily , document, and provide at future appointments check blood pressure weekly, document, and provide at future appointments  Follow Up Plan: Telephone follow up appointment with care management team member scheduled for: June 2024  NatArizona ConstablehaSherian Rein - [phone]        Medication Assistance:  [place]ok[place]obt[place] through [place].  Enrollment ends 10/17/2022 -Will renew for 2024  Patient's preferred pharmacy is:  [facility] [address]one:  [phone]: [phone]one: [phone]: [phone] pill box? Yes Pt endorses 100% compliance  We discussed: Current pharmacy is preferred with insurance plan and patient is satisfied with pharmacy services Patient decided to: Continue current medication management strategy   Care Gaps: Last eye exam / Retinopathy Screening? 09/29/20 Last Annual Wellness Visit? None noted  Last Diabetic Foot Exam? 06/09/22  Care Plan and Follow Up Patient Decision:  Patient agrees to Care Plan and Follow-up.  Plan: Telephone follow up appointment with care management team member scheduled for:  11/23  NatArizona Constableharm.D. - [phone]- [phone]

## 2022-08-18 NOTE — Patient Instructions (Signed)
Visit Information   Goals Addressed   None    Patient Care Plan: CCM Pharmacy Care Plan     Problem Identified: dm, htn, hld, cad   Priority: High  Onset Date: 12/12/2020     Long-Range Goal: Disease Management   Start Date: 12/12/2020  Expected End Date: 12/12/2021  Recent Progress: On track  Priority: High  Note:   Current Barriers:  Unable to independently afford treatment regimen  Pharmacist Clinical Goal(s):  Over the next 90 days, patient will verbalize ability to afford treatment regimen maintain control of diabetes as evidenced by a1c  through collaboration with PharmD and provider.   Interventions: 1:1 collaboration with Lillard Anes, MD regarding development and update of comprehensive plan of care as evidenced by provider attestation and co-signature Inter-disciplinary care team collaboration (see longitudinal plan of care) Comprehensive medication review performed; medication list updated in electronic medical record  Hypertension (BP goal <130/80) -Controlled -Current treatment: Metoprolol tartrate 25 mg bid Appropriate, Effective, Safe, Accessible -Medications previously tried: none noted -Current home readings:  October 2023: Patient not testing. Cardio asked them to start at appt this week. They agreed to start today -Current dietary habits: low potassium diet. Trying to increase intake to avoid additional weight loss.  -Current exercise habits: stays active around his house and walking daughter's dog -Denies hypotensive/hypertensive symptoms -Educated on BP goals and benefits of medications for prevention of heart attack, stroke and kidney damage; Daily salt intake goal < 2300 mg; Exercise goal of 150 minutes per week; Importance of home blood pressure monitoring; -Counseled to monitor BP at home weekly, document, and provide log at future appointments -Counseled on diet and exercise extensively Recommended to continue current  medication  Hyperlipidemia: (LDL goal < 55) The ASCVD Risk score (Arnett DK, et al., 2019) failed to calculate for the following reasons:   The 2019 ASCVD risk score is only valid for ages 52 to 51 Lab Results  Component Value Date   CHOL 134 06/09/2022   CHOL 125 01/26/2022   CHOL 134 09/22/2021   Lab Results  Component Value Date   HDL 43 06/09/2022   HDL 53 01/26/2022   HDL 48 09/22/2021   Lab Results  Component Value Date   LDLCALC 72 06/09/2022   LDLCALC 59 01/26/2022   LDLCALC 73 09/22/2021   Lab Results  Component Value Date   TRIG 101 06/09/2022   TRIG 57 01/26/2022   TRIG 61 09/22/2021   Lab Results  Component Value Date   CHOLHDL 3.1 06/09/2022   CHOLHDL 2.4 01/26/2022   CHOLHDL 2.8 09/22/2021  No results found for: "LDLDIRECT" -Not ideally controlled -Current treatment: Simvastatin 40 mg - 1/2 tablet daily Query Appropriate,  -Medications previously tried: none reported  -Current dietary patterns: low potassium diet.  -Current exercise habits: travels frequently to visit family. Works around his home and walks with dog..  -Educated on Cholesterol goals;  Benefits of statin for ASCVD risk reduction; Importance of limiting foods high in cholesterol; Exercise goal of 150 minutes per week; -Counseled on diet and exercise extensively April 2023: Will ask PCP to send in Simvastatin 30m QD October 2023: Will ask PCP to send in Simvastatin 242mQD  Diabetes (A1c goal <7%) Lab Results  Component Value Date   HGBA1C 6.0 (H) 06/09/2022   HGBA1C 5.7 (H) 01/26/2022   HGBA1C 6.0 (H) 09/22/2021   Lab Results  Component Value Date   MICROALBUR 80 12/23/2020   LDLCALC 72 06/09/2022   CREATININE 1.62 (  H) 06/22/2022   Lab Results  Component Value Date   NA 148 (H) 06/22/2022   K 4.8 06/22/2022   CREATININE 1.62 (H) 06/22/2022   EGFR 42 (L) 06/22/2022   GFRNONAA 43 (L) 08/19/2020   GLUCOSE 97 06/22/2022   Lab Results  Component Value Date   WBC 9.4  06/09/2022   HGB 14.9 06/09/2022   HCT 44.5 06/09/2022   MCV 89 06/09/2022   PLT 352 06/09/2022  -Controlled -Current medications: Metformin 500 mg bid Appropriate, Effective, Safe, Accessible -Medications previously tried: none reported  -Current home glucose readings fasting glucose:  October 2023: Not writing down values post prandial glucose: none reported -Denies hypoglycemic/hyperglycemic symptoms -Current meal patterns:  Eats a variety of fruits and vegetables. Limits potassium in diet due to kidney. Trying to increase protein.  -Current exercise: stays active around home.  -Educated onA1c and blood sugar goals; Complications of diabetes including kidney damage, retinal damage, and cardiovascular disease; Exercise goal of 150 minutes per week; Benefits of routine self-monitoring of blood sugar; -Counseled to check feet daily and get yearly eye exams -Counseled on diet and exercise extensively -No ACE/ARB due to K+ Recommended to continue current medication  Hypothyroidism (Goal: manage TSH) Lab Results  Component Value Date   TSH 3.970 06/09/2022  -Controlled -Current treatment  Levothyroxine 100 mcg daily Appropriate, Effective, Safe, Accessible -Medications previously tried: none reported  -Recommended to continue current medication  Chronic Kidney Disease Stage 3B  -Managed by Dr. Alyson Ingles Lab Results  Component Value Date   CREATININE 1.62 (H) 06/22/2022   BUN 20 06/22/2022   NA 148 (H) 06/22/2022   K 4.8 06/22/2022   CL 105 06/22/2022   CO2 30 (H) 06/22/2022   Lab Results  Component Value Date   NA 148 (H) 06/22/2022   K 4.8 06/22/2022   CREATININE 1.62 (H) 06/22/2022   EGFR 42 (L) 06/22/2022   GFRNONAA 43 (L) 08/19/2020   GLUCOSE 97 06/22/2022  -Current treatment  Lokelma 10g QD (PAP) Appropriate, Effective, Safe, Accessible Sodium Bicarbonate 644m BID Appropriate, Effective, Safe, Accessible October 2023: Will redo PAP for 2024 -Recommended to  continue current medication  Gout (Goal: Prevent gout flares) -Controlled -Last Gout Flare: July of 2020 per patient -Current treatment  Allopurinol 1072mAppropriate, Effective, Safe, Accessible Colchicine 0.35m235mrn Appropriate, Effective, Safe, Accessible -Medications previously tried: N/A  -Recommended to continue current medication   Patient Goals/Self-Care Activities Over the next 90 days, patient will:  - take medications as prescribed focus on medication adherence by using pill box check glucose daily , document, and provide at future appointments check blood pressure weekly, document, and provide at future appointments  Follow Up Plan: Telephone follow up appointment with care management team member scheduled for: June 2024  NatArizona ConstablehaSherian Rein - [phone]   Mr. BroYarbros given information about Chronic Care Management services today including:  CCM service includes personalized support from designated clinical staff supervised by his physician, including individualized plan of care and coordination with other care providers 24/7 contact phone numbers for assistance for urgent and routine care needs. Standard insurance, coinsurance, copays and deductibles apply for chronic care management only during months in which we provide at least 20 minutes of these services. Most insurances cover these services at 100%, however patients may be responsible for any copay, coinsurance and/or deductible if applicable. This service may help you avoid the need for more expensive face-to-face services. Only one practitioner may furnish and bill the service  in a calendar month. The patient may stop CCM services at any time (effective at the end of the month) by phone call to the office staff.  Patient agreed to services and verbal consent obtained.   The patient verbalized understanding of instructions, educational materials, and care plan provided today and DECLINED offer to  receive copy of patient instructions, educational materials, and care plan.  The pharmacy team will reach out to the patient again over the next 60 days.   Lane Hacker, [place]

## 2022-08-19 ENCOUNTER — Other Ambulatory Visit: Payer: Self-pay

## 2022-08-24 ENCOUNTER — Ambulatory Visit: Payer: Medicare PPO | Admitting: Urology

## 2022-08-24 ENCOUNTER — Encounter: Payer: Self-pay | Admitting: Urology

## 2022-08-24 VITALS — BP 158/85 | HR 65

## 2022-08-24 DIAGNOSIS — N1330 Unspecified hydronephrosis: Secondary | ICD-10-CM | POA: Diagnosis not present

## 2022-08-24 DIAGNOSIS — R339 Retention of urine, unspecified: Secondary | ICD-10-CM | POA: Diagnosis not present

## 2022-08-24 DIAGNOSIS — N401 Enlarged prostate with lower urinary tract symptoms: Secondary | ICD-10-CM

## 2022-08-24 LAB — BLADDER SCAN AMB NON-IMAGING: Scan Result: 547

## 2022-08-24 MED ORDER — BETHANECHOL CHLORIDE 10 MG PO TABS: 10.0000 mg | ORAL_TABLET | Freq: Two times a day (BID) | ORAL | 3 refills | Status: DC

## 2022-08-24 MED ORDER — FINASTERIDE 5 MG PO TABS: 5.0000 mg | ORAL_TABLET | Freq: Every day | ORAL | 3 refills | Status: DC

## 2022-08-24 NOTE — Patient Instructions (Signed)

## 2022-08-24 NOTE — Progress Notes (Signed)
post void residual=[phone]

## 2022-08-24 NOTE — Progress Notes (Signed)
08/24/2022 11:24 AM   David Baxter [birth year] [id number]  Referring provider: Lillard Anes, MD [address] [place],  [place] [address]  Followup urinary retention and hydronephrosis   HPI: David Baxter is a 81yo here for followup for urinary retention and hydronephrosis. IPSS 4 QOL 1 on finasteride and bethenacol 10mg  BID. Urine stream strong. No straining to urinate.  PVR 547cc. Renal US on 08/17/2022 shows no hydronephrosis. No other complaints today   PMH: Past Medical History:  Diagnosis Date   Acquired hypothyroidism 04/08/2018   Acute retention of urine 04/09/2018   Acute sinusitis 06/05/2020   AKI (acute kidney injury) ([place]) 04/08/2018   Arthralgia of both lower legs    BMI 23.0-23.9, adult 04/21/2020   BPH (benign prostatic hyperplasia)    BPH with urinary obstruction 12/05/2019   CAD (coronary artery disease)    cardiologist--  David Baxter w/ [facility] in [place]   CKD (chronic kidney disease) stage 3, GFR 30-59 ml/min ([place]) 07/06/2015   CKD (chronic kidney disease), stage III ([place])    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 DES OM 2012    2. two DES to circumflex coronary artery 03/03/15   Diabetic glomerulopathy ([place]) 12/05/2019   Essential hypertension 04/09/2015   Gout 04/08/2018   History of gout    History of kidney stones    History of stroke    per pt/ wife -- unknown when-- told this was on MRI in 2017   [facility] (hard of hearing)    Hyperkalemia 05/09/2020   Hyperlipemia, retention 04/09/2015   Hyperlipidemia    Hypertension    Hypothyroidism    S/P drug eluting coronary stent placement    2012-- x1DES to OM:  05/ 2016  x2 to CFX   Secondary hyperparathyroidism, non-renal ([place]) 12/05/2019   SIRS (systemic inflammatory response syndrome) ([place]) 04/08/2018   Type 2 diabetes mellitus ([place]) 04/08/2018   UTI (urinary tract infection) 04/08/2018    Surgical History: Past Surgical  History:  Procedure Laterality Date   CORONARY ANGIOPLASTY WITH STENT PLACEMENT  03/04/2015   x2 DES to CFX   CORONARY ANGIOPLASTY WITH STENT PLACEMENT  2012   x1 DES to OM   CYSTOSCOPY WITH INSERTION OF UROLIFT N/A 02/14/2017   Procedure: CYSTOSCOPY WITH INSERTION OF UROLIFT;  Surgeon: David Gustin, MD;  Location: [facility];  Service: Urology;  Laterality: N/A;   EXTRACORPOREAL SHOCK WAVE LITHOTRIPSY  1990's   PERCUTANEOUS NEPHROSTOLITHOTOMY  1991   and abdominal hernia repair   URETEROLITHOTOMY  1991 and 1994    Home Medications:  Allergies as of 08/24/2022       Reactions   Tamsulosin Other (See Comments)   Aspirin Other (See Comments)   Upset stomach        Medication List        Accurate as of August 24, 2022 11:24 AM. If you have any questions, ask your nurse or doctor.          Accu-Chek Aviva Plus test strip Generic drug: glucose blood USE TWICE DAILY AS DIRECTED   Accu-Chek Aviva Soln 1 each by In Vitro route every 30 (thirty) days.   Accu-Chek FastClix Lancets Misc 1 each by Does not apply route in the morning and at bedtime. Use to check blood sugar twice daily as directed.   Alcohol Prep 70 % Pads 1 each by Does not apply route daily.  allopurinol 100 MG tablet Commonly known as: ZYLOPRIM TAKE 1 TABLET EVERY DAY   aspirin EC 81 MG tablet Take 81 mg by mouth daily.   bethanechol 10 MG tablet Commonly known as: URECHOLINE Take 1 tablet (10 mg total) by mouth in the morning and at bedtime.   Colcrys 0.6 MG tablet Generic drug: colchicine Prn gout flares   cyanocobalamin 1000 MCG tablet Commonly known as: VITAMIN B12 Take 1,000 mcg by mouth daily.   finasteride 5 MG tablet Commonly known as: PROSCAR Take 1 tablet (5 mg total) by mouth daily.   levothyroxine 125 MCG tablet Commonly known as: SYNTHROID Take 1 tablet (125 mcg total) by mouth daily.   Lokelma 10 g Pack packet Generic drug: sodium zirconium  cyclosilicate Take 10 g by mouth daily.   meclizine 25 MG tablet Commonly known as: ANTIVERT Take by mouth. Takes OTC prn dizziness.   metFORMIN 500 MG tablet Commonly known as: GLUCOPHAGE TAKE 1 TABLET TWICE DAILY   metoprolol tartrate 25 MG tablet Commonly known as: LOPRESSOR TAKE 1 TABLET TWICE DAILY   nitroGLYCERIN 0.4 MG SL tablet Commonly known as: NITROSTAT Place 0.4 mg under the tongue every 5 (five) minutes as needed for chest pain.   simvastatin 20 MG tablet Commonly known as: ZOCOR Take 20 mg by mouth daily.   sodium bicarbonate 650 MG tablet Take by mouth 2 (two) times daily.   Vitamin D-1000 Max St 25 MCG (1000 UT) tablet Generic drug: Cholecalciferol Take 2,000 Units by mouth.        Allergies:  Allergies  Allergen Reactions   Tamsulosin Other (See Comments)   Aspirin Other (See Comments)    Upset stomach    Family History: Family History  Problem Relation Age of Onset   Heart disease Mother 11       After age 7 - CHF   Hypertension Father    Heart disease Father 68       After age 8 - CHF    Social History:  reports that he has never smoked. He has never been exposed to tobacco smoke. He has never used smokeless tobacco. He reports that he does not drink alcohol and does not use drugs.  ROS: All other review of systems were reviewed and are negative except what is noted above in HPI  Physical Exam: BP (!) 158/85   Pulse 65   Constitutional:  Alert and oriented, No acute distress. HEENT: [place] AT, moist mucus membranes.  Trachea midline, no masses. Cardiovascular: No clubbing, cyanosis, or edema. Respiratory: Normal respiratory effort, no increased work of breathing. GI: Abdomen is soft, nontender, nondistended, no abdominal masses GU: No CVA tenderness.  Lymph: No cervical or inguinal lymphadenopathy. Skin: No rashes, bruises or suspicious lesions. Neurologic: Grossly intact, no focal deficits, moving all 4 extremities. Psychiatric:  Normal mood and affect.  Laboratory Data: Lab Results  Component Value Date   WBC 9.4 06/09/2022   HGB 14.9 06/09/2022   HCT 44.5 06/09/2022   MCV 89 06/09/2022   PLT 352 06/09/2022    Lab Results  Component Value Date   CREATININE 1.62 (H) 06/22/2022    No results found for: "PSA"  No results found for: "TESTOSTERONE"  Lab Results  Component Value Date   HGBA1C 6.0 (H) 06/09/2022    Urinalysis    Component Value Date/Time   APPEARANCEUR Clear 02/19/2022 1320   GLUCOSEU Negative 02/19/2022 1320   BILIRUBINUR Negative 02/19/2022 1320   PROTEINUR Negative 02/19/2022 1320  NITRITE Negative 02/19/2022 1320   LEUKOCYTESUR Negative 02/19/2022 1320    Lab Results  Component Value Date   LABMICR Comment 02/19/2022    Pertinent Imaging: Renal US 08/17/2022: Images reviewed and discussed with the patient  No results found for this or any previous visit.  No results found for this or any previous visit.  No results found for this or any previous visit.  No results found for this or any previous visit.  Results for orders placed during the hospital encounter of 08/17/22  Ultrasound renal complete  Narrative CLINICAL DATA:  Hydronephrosis  EXAM: RENAL / URINARY TRACT ULTRASOUND COMPLETE  COMPARISON:  02/11/2022  FINDINGS: Right Kidney:  Renal measurements: 9.1 x 5.4 x 5.1 cm = volume: 131 mL. Cortical thinning. Increased cortical echogenicity. Mild dilatation of the renal collecting system and pelvis. Cyst identified RIGHT kidney medially 3.6 x 3.9 x 4.2 cm previously 3.8 x 3.9 x 4.0 cm. This contains a partial septation and question mural nodularity. No additional mass or shadowing calcification.  Left Kidney:  Renal measurements: 9.8 x 4.7 x 5.0 cm = volume: 121 mL. Cortical thinning. Increased cortical echogenicity. No mass or hydronephrosis. No shadowing calcification.  Bladder:  Appears normal for degree of bladder distention. Well  distended, calculated bladder volume 636 mL with a significant postvoid residual volume of 462 mL.  Other:  None.  IMPRESSION: Significant postvoid urinary bladder residual volume.  BILATERAL renal cortical atrophy and medical renal disease changes.  Complicated cyst medial upper RIGHT kidney 4.2 cm diameter containing at least a partial septation and questionable mural nodule; this has gradually increased in size since 03/31/2021 when it measured 3.6 x 3.1 x 2.7 cm and further evaluation by David or CT imaging with and without contrast recommended.   Electronically Signed By: Lavonia Dana M.D. On: 08/18/2022 17:12  No valid procedures specified. No results found for this or any previous visit.  No results found for this or any previous visit.   Assessment & Plan:    1. Hydronephrosis, unspecified hydronephrosis type RTC 6 months with renal US - BLADDER SCAN AMB NON-IMAGING - Urinalysis, Routine w reflex microscopic  2. Retention, urine -Continue bethanecol 10mg  BID - BLADDER SCAN AMB NON-IMAGING - Urinalysis, Routine w reflex microscopic  3. Benign localized prostatic hyperplasia with lower urinary tract symptoms (LUTS) -Continue finasteride and bethanecol 10mg  BID - BLADDER SCAN AMB NON-IMAGING   No follow-ups on file.  Nicolette Bang, MD  [facility]

## 2022-08-25 LAB — MICROSCOPIC EXAMINATION: Bacteria, UA: NONE SEEN

## 2022-08-25 LAB — URINALYSIS, ROUTINE W REFLEX MICROSCOPIC
Bilirubin, UA: NEGATIVE
Glucose, UA: NEGATIVE
Ketones, UA: NEGATIVE
Nitrite, UA: NEGATIVE
Protein,UA: NEGATIVE
RBC, UA: NEGATIVE
Specific Gravity, UA: 1.015 (ref 1.005–1.030)
Urobilinogen, Ur: 1 mg/dL (ref 0.2–1.0)
pH, UA: 7 (ref 5.0–7.5)

## 2022-08-25 MED ORDER — SIMVASTATIN 20 MG PO TABS: 20.0000 mg | ORAL_TABLET | Freq: Every day | ORAL | 1 refills | Status: DC

## 2022-09-03 ENCOUNTER — Telehealth: Payer: Self-pay

## 2022-09-03 ENCOUNTER — Other Ambulatory Visit: Payer: Self-pay

## 2022-09-03 MED ORDER — SIMVASTATIN 20 MG PO TABS: 20.0000 mg | ORAL_TABLET | Freq: Every day | ORAL | 1 refills | Status: DC

## 2022-09-03 NOTE — Chronic Care Management (AMB) (Signed)
Faxed 2024 re-enrollment form to [employer] for [facility].  Billee Cashing, CMA Clinical Pharmacist Assistant [phone]

## 2022-09-16 DIAGNOSIS — E785 Hyperlipidemia, unspecified: Secondary | ICD-10-CM

## 2022-09-16 DIAGNOSIS — E039 Hypothyroidism, unspecified: Secondary | ICD-10-CM

## 2022-09-16 DIAGNOSIS — I129 Hypertensive chronic kidney disease with stage 1 through stage 4 chronic kidney disease, or unspecified chronic kidney disease: Secondary | ICD-10-CM

## 2022-09-16 DIAGNOSIS — E1122 Type 2 diabetes mellitus with diabetic chronic kidney disease: Secondary | ICD-10-CM

## 2022-09-16 DIAGNOSIS — Z794 Long term (current) use of insulin: Secondary | ICD-10-CM

## 2022-09-16 DIAGNOSIS — N1832 Chronic kidney disease, stage 3b: Secondary | ICD-10-CM

## 2022-09-27 DIAGNOSIS — E875 Hyperkalemia: Secondary | ICD-10-CM | POA: Diagnosis not present

## 2022-09-27 DIAGNOSIS — E559 Vitamin D deficiency, unspecified: Secondary | ICD-10-CM | POA: Diagnosis not present

## 2022-09-27 DIAGNOSIS — N189 Chronic kidney disease, unspecified: Secondary | ICD-10-CM | POA: Diagnosis not present

## 2022-09-27 DIAGNOSIS — N183 Chronic kidney disease, stage 3 unspecified: Secondary | ICD-10-CM | POA: Diagnosis not present

## 2022-09-27 DIAGNOSIS — I10 Essential (primary) hypertension: Secondary | ICD-10-CM | POA: Diagnosis not present

## 2022-09-27 DIAGNOSIS — D6489 Other specified anemias: Secondary | ICD-10-CM | POA: Diagnosis not present

## 2022-09-27 DIAGNOSIS — R809 Proteinuria, unspecified: Secondary | ICD-10-CM | POA: Diagnosis not present

## 2022-09-27 DIAGNOSIS — R309 Painful micturition, unspecified: Secondary | ICD-10-CM | POA: Diagnosis not present

## 2022-09-27 DIAGNOSIS — M109 Gout, unspecified: Secondary | ICD-10-CM | POA: Diagnosis not present

## 2022-09-29 ENCOUNTER — Encounter: Payer: Self-pay | Admitting: Legal Medicine

## 2022-09-29 ENCOUNTER — Other Ambulatory Visit: Payer: Medicare PPO

## 2022-09-29 ENCOUNTER — Ambulatory Visit (INDEPENDENT_AMBULATORY_CARE_PROVIDER_SITE_OTHER): Payer: Medicare PPO | Admitting: Legal Medicine

## 2022-09-29 VITALS — BP 120/84 | HR 61 | Temp 97.4°F | Resp 14 | Ht 69.0 in | Wt 173.0 lb

## 2022-09-29 DIAGNOSIS — E1169 Type 2 diabetes mellitus with other specified complication: Secondary | ICD-10-CM

## 2022-09-29 DIAGNOSIS — Z23 Encounter for immunization: Secondary | ICD-10-CM

## 2022-09-29 DIAGNOSIS — E782 Mixed hyperlipidemia: Secondary | ICD-10-CM | POA: Diagnosis not present

## 2022-09-29 DIAGNOSIS — I10 Essential (primary) hypertension: Secondary | ICD-10-CM | POA: Diagnosis not present

## 2022-09-29 DIAGNOSIS — E039 Hypothyroidism, unspecified: Secondary | ICD-10-CM

## 2022-09-29 DIAGNOSIS — I25708 Atherosclerosis of coronary artery bypass graft(s), unspecified, with other forms of angina pectoris: Secondary | ICD-10-CM | POA: Diagnosis not present

## 2022-09-29 NOTE — Progress Notes (Signed)
Subjective:  Patient ID: David Baxter, male    DOB: 07-Feb-1941  Age: 81 y.o. MRN: [id number]  No chief complaint on file.   HPI   Current Outpatient Medications on File Prior to Visit  Medication Sig Dispense Refill   ACCU-CHEK AVIVA PLUS test strip USE TWICE DAILY AS DIRECTED 200 strip 10   Accu-Chek FastClix Lancets MISC 1 each by Does not apply route in the morning and at bedtime. Use to check blood sugar twice daily as directed. 100 each 6   Alcohol Swabs (ALCOHOL PREP) 70 % PADS 1 each by Does not apply route daily. 100 each 2   allopurinol (ZYLOPRIM) 100 MG tablet TAKE 1 TABLET EVERY DAY 90 tablet 2   aspirin EC 81 MG tablet Take 81 mg by mouth daily.     bethanechol (URECHOLINE) 10 MG tablet Take 1 tablet (10 mg total) by mouth in the morning and at bedtime. 180 tablet 3   Blood Glucose Calibration (ACCU-CHEK AVIVA) SOLN 1 each by In Vitro route every 30 (thirty) days. 1 each 5   Cholecalciferol (VITAMIN D-1000 MAX ST) 25 MCG (1000 UT) tablet Take 2,000 Units by mouth.      COLCRYS 0.6 MG tablet Prn gout flares     finasteride (PROSCAR) 5 MG tablet Take 1 tablet (5 mg total) by mouth daily. 90 tablet 3   levothyroxine (SYNTHROID) 125 MCG tablet Take 1 tablet (125 mcg total) by mouth daily. 90 tablet 2   LOKELMA 10 g PACK packet Take 10 g by mouth daily.      meclizine (ANTIVERT) 25 MG tablet Take by mouth. Takes OTC prn dizziness.     metFORMIN (GLUCOPHAGE) 500 MG tablet TAKE 1 TABLET TWICE DAILY 180 tablet 2   metoprolol tartrate (LOPRESSOR) 25 MG tablet TAKE 1 TABLET TWICE DAILY 180 tablet 2   nitroGLYCERIN (NITROSTAT) 0.4 MG SL tablet Place 0.4 mg under the tongue every 5 (five) minutes as needed for chest pain.     simvastatin (ZOCOR) 20 MG tablet Take 1 tablet (20 mg total) by mouth daily. 90 tablet 1   sodium bicarbonate 650 MG tablet Take by mouth 2 (two) times daily.     vitamin B-12 (CYANOCOBALAMIN) 1000 MCG tablet Take 1,000 mcg by mouth daily.     No current  facility-administered medications on file prior to visit.   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 DES OM 2012    2. 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   Essential hypertension 04/09/2015   Gout 04/08/2018   History of gout    History of kidney stones    History of stroke    per pt/ wife -- unknown when-- told this was on MRI in 2017   [facility] (hard of hearing)    Hyperkalemia 05/09/2020  Hyperlipemia, retention 04/09/2015   Hyperlipidemia    Hypertension    Hypothyroidism    S/P drug eluting coronary stent placement    2012-- x1DES to OM:  05/ 2016  x2 to CFX   Secondary hyperparathyroidism, non-renal ([facility]) 12/05/2019   SIRS (systemic inflammatory response syndrome) ([facility]) 04/08/2018   Type 2 diabetes mellitus ([facility]) 04/08/2018   UTI (urinary tract infection) 04/08/2018   Past Surgical History:  Procedure Laterality Date   CORONARY ANGIOPLASTY WITH STENT PLACEMENT  03/04/2015   x2 DES to CFX   CORONARY ANGIOPLASTY WITH STENT PLACEMENT  2012   x1 DES to OM   CYSTOSCOPY WITH INSERTION OF UROLIFT N/A 02/14/2017   Procedure: CYSTOSCOPY WITH INSERTION OF UROLIFT;  Surgeon: Malen Gauze, MD;  Location: [facility];  Service: Urology;  Laterality: N/A;   EXTRACORPOREAL SHOCK WAVE LITHOTRIPSY  1990's   PERCUTANEOUS  NEPHROSTOLITHOTOMY  1991   and abdominal hernia repair   URETEROLITHOTOMY  1991 and 1994    Family History  Problem Relation Age of Onset   Heart disease Mother 52       After age 40 - CHF   Hypertension Father    Heart disease Father 75       After age 63 - CHF   Social History   Socioeconomic History   Marital status: Married    Spouse name: Not on file   Number of children: Not on file   Years of education: Not on file   Highest education level: Not on file  Occupational History   Occupation: truck driver  Tobacco Use   Smoking status: Never    Passive exposure: Never   Smokeless tobacco: Never  Vaping Use   Vaping Use: Never used  Substance and Sexual Activity   Alcohol use: No   Drug use: No   Sexual activity: Yes    Partners: Female  Other Topics Concern   Not on file  Social History Narrative   Not on file   Social Determinants of Health   Financial Resource Strain: High Risk (08/18/2022)   Overall Financial Resource Strain (CARDIA)    Difficulty of Paying Living Expenses: Hard  Food Insecurity: No Food Insecurity (06/11/2020)   Hunger Vital Sign    Worried About Running Out of Food in the Last Year: Never true    Ran Out of Food in the Last Year: Never true  Transportation Needs: No Transportation Needs (08/18/2022)   PRAPARE - Administrator, Civil Service (Medical): No    Lack of Transportation (Non-Medical): No  Physical Activity: Not on file  Stress: Not on file  Social Connections: Not on file    Review of Systems   Objective:  There were no vitals taken for this visit.     09/29/2022    2:17 PM 08/24/2022   11:13 AM 08/16/2022   11:19 AM  BP/Weight  Systolic BP 120 158 156  Diastolic BP 84 85 88  Wt. (Lbs) 173    BMI 25.55 kg/m2      Physical Exam  Diabetic Foot Exam - Simple   No data filed      Lab Results  Component Value Date   WBC 9.4 06/09/2022   HGB 14.9 06/09/2022   HCT 44.5 06/09/2022   PLT 352  06/09/2022   GLUCOSE 97 06/22/2022   CHOL 134 06/09/2022   TRIG 101 06/09/2022   HDL 43 06/09/2022   LDLCALC 72 06/09/2022   ALT 8  06/22/2022   AST 20 06/22/2022   NA 148 (H) 06/22/2022   K 4.8 06/22/2022   CL 105 06/22/2022   CREATININE 1.62 (H) 06/22/2022   BUN 20 06/22/2022   CO2 30 (H) 06/22/2022   TSH 3.970 06/09/2022   HGBA1C 6.0 (H) 06/09/2022   MICROALBUR 80 12/23/2020      Assessment & Plan:   Problem List Items Addressed This Visit   None .  No orders of the defined types were placed in this encounter.   Orders Placed This Encounter  Procedures   [facility] DIABETES EYE EXAM     Follow-up: No follow-ups on file.  An After Visit Summary was printed and given to the patient.  Brent Bulla, MD [facility] [phone]

## 2022-09-29 NOTE — Progress Notes (Signed)
Subjective:  Patient ID: David Baxter, male    DOB: 1941-04-30  Age: 81 y.o. MRN: [id number]  Chief Complaint  Patient presents with   Hyperlipidemia   Diabetes    HPI   Patient present with type 2 diabetes.  Compliance with treatment has been good; patient take medicines as directed, maintains diet and exercise regimen, follows up as directed, and is keeping glucose diary. Current medicines for diabetes Metformin 500 mg twice a day. Patient performs foot exams daily and last ophthalmologic exam was 09/29/2020. Last A1C was 6.0 %  Patient presents with hyperlipidemia.  Compliance with treatment has been good; patient takes medicines as directed, maintains low cholesterol diet, follows up as directed, and maintains exercise regimen.  Patient is using Simvastatin 40 mg daily without problems.   Patient presents for follow up of hypertension.  Patient tolerating Metoprolol 25 mg twice a day, Aspirin 81 mg daily well without side effects. Patient is working on maintaining diet and exercise regimen and follows up as directed.   Hypothyroidism: Patient is taking Levothyroxine 125 mcg daily.  GOUT: Taking Allopurinol 100 mg daily, Colcrys 0.6 mg PRN for gout flares, bethanechol 10 mg in the morning. Current Outpatient Medications on File Prior to Visit  Medication Sig Dispense Refill   ACCU-CHEK AVIVA PLUS test strip USE TWICE DAILY AS DIRECTED 200 strip 10   Accu-Chek FastClix Lancets MISC 1 each by Does not apply route in the morning and at bedtime. Use to check blood sugar twice daily as directed. 100 each 6   Alcohol Swabs (ALCOHOL PREP) 70 % PADS 1 each by Does not apply route daily. 100 each 2   allopurinol (ZYLOPRIM) 100 MG tablet TAKE 1 TABLET EVERY DAY 90 tablet 2   aspirin EC 81 MG tablet Take 81 mg by mouth daily.     bethanechol (URECHOLINE) 10 MG tablet Take 1 tablet (10 mg total) by mouth in the morning and at bedtime. 180 tablet 3   Blood Glucose Calibration (ACCU-CHEK AVIVA)  SOLN 1 each by In Vitro route every 30 (thirty) days. 1 each 5   Cholecalciferol (VITAMIN D-1000 MAX ST) 25 MCG (1000 UT) tablet Take 2,000 Units by mouth.      COLCRYS 0.6 MG tablet Prn gout flares     finasteride (PROSCAR) 5 MG tablet Take 1 tablet (5 mg total) by mouth daily. 90 tablet 3   LOKELMA 10 g PACK packet Take 10 g by mouth daily.      meclizine (ANTIVERT) 25 MG tablet Take by mouth. Takes OTC prn dizziness.     metFORMIN (GLUCOPHAGE) 500 MG tablet TAKE 1 TABLET TWICE DAILY 180 tablet 2   metoprolol tartrate (LOPRESSOR) 25 MG tablet TAKE 1 TABLET TWICE DAILY 180 tablet 2   nitroGLYCERIN (NITROSTAT) 0.4 MG SL tablet Place 0.4 mg under the tongue every 5 (five) minutes as needed for chest pain.     simvastatin (ZOCOR) 20 MG tablet Take 1 tablet (20 mg total) by mouth daily. 90 tablet 1   sodium bicarbonate 650 MG tablet Take by mouth 2 (two) times daily.     vitamin B-12 (CYANOCOBALAMIN) 1000 MCG tablet Take 1,000 mcg by mouth daily.     No current facility-administered medications on file prior to visit.   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 DES OM 2012    2. 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   SIRS (systemic inflammatory response syndrome) (HCC) 04/08/2018   Type 2 diabetes mellitus (HCC) 04/08/2018   UTI (urinary tract infection) 04/08/2018   Past Surgical History:  Procedure Laterality Date   CORONARY ANGIOPLASTY WITH STENT PLACEMENT  03/04/2015   x2 DES to CFX   CORONARY ANGIOPLASTY WITH STENT PLACEMENT  2012   x1 DES to OM   CYSTOSCOPY WITH INSERTION OF UROLIFT N/A 02/14/2017   Procedure: CYSTOSCOPY WITH INSERTION OF UROLIFT;  Surgeon: Malen GauzePatrick L McKenzie, MD;  [facility] [place];  Service: Urology;  Laterality: N/A;   EXTRACORPOREAL SHOCK WAVE LITHOTRIPSY  1990's   PERCUTANEOUS NEPHROSTOLITHOTOMY  1991   and abdominal hernia repair   URETEROLITHOTOMY  1991 and 1994    Family History  Problem Relation Age of Onset   Heart disease Mother 7374       After age 83 - CHF   Hypertension Father    Heart disease Father [id number]       After age 83 - CHF   Social History   Socioeconomic History   Marital status: Married    Spouse name: Not on file   Number of children: Not on file   Years of education: Not on file   Highest education level: Not on file  Occupational History   Occupation: truck driver  Tobacco Use   Smoking status: Never    Passive exposure: Never   Smokeless tobacco: Never  Vaping Use   Vaping Use: Never used  Substance and Sexual Activity   Alcohol use: No   Drug use: No   Sexual activity: Yes    Partners: Female  Other Topics Concern   Not on file  Social History Narrative   Not on file   Social Determinants of Health   Financial Resource Strain: High Risk (08/18/2022)   Overall Financial  Resource Strain (CARDIA)    Difficulty of Paying Living Expenses: Hard  Food Insecurity: No Food Insecurity (06/11/2020)   Hunger Vital Sign    Worried About Running Out of Food in the Last Year: Never true    Ran Out of Food in the Last Year: Never true  Transportation Needs: No Transportation Needs (08/18/2022)   PRAPARE - Administrator, Civil ServiceTransportation    Lack of Transportation (Medical): No    Lack of Transportation (Non-Medical): No  Physical Activity: Not on file  Stress: Not on file  Social Connections: Not on file    Review of Systems  Constitutional:  Negative for chills, fatigue, fever and unexpected weight change.  HENT:  Negative for congestion, ear pain, sinus pain and sore throat.   Eyes:  Negative for visual disturbance.  Respiratory:  Negative for cough and shortness of breath.   Cardiovascular:  Negative for chest pain and palpitations.  Gastrointestinal:  Negative for abdominal pain, blood in stool, constipation, diarrhea, nausea and vomiting.  Endocrine: Negative for polydipsia.  Genitourinary:  Negative for dysuria.  Musculoskeletal:  Negative for back pain.  Skin:  Negative for rash.  Neurological:  Negative for headaches.  Psychiatric/Behavioral:  Positive for behavioral problems.      Objective:  BP 120/84   Pulse 61   Temp (!) 97.4 F (36.3 C)   Resp 14   Ht 5\' 9"  (1.753 m)   Wt 173 lb (78.5 kg)   SpO2 100%   BMI 25.55 kg/m      09/29/2022    2:17 PM 08/24/2022   11:13 AM 08/16/2022   11:19 AM  BP/Weight  Systolic BP 120 158 156  Diastolic BP 84 85 88  Wt. (Lbs) 173    BMI 25.55 kg/m2      Physical Exam Vitals reviewed.  Constitutional:      General: He is not in acute distress.    Appearance: Normal appearance.  HENT:     Head: Normocephalic and atraumatic.     Right Ear: Tympanic membrane normal.     Left Ear: Tympanic membrane normal.     Nose: Nose normal.     Mouth/Throat:     Mouth: Mucous membranes are moist.     Pharynx: Oropharynx is  clear.  Eyes:     Extraocular Movements: Extraocular movements intact.     Conjunctiva/sclera: Conjunctivae normal.     Pupils: Pupils are equal, round, and reactive to light.  Cardiovascular:     Rate and Rhythm: Normal rate and regular rhythm.     Pulses: Normal pulses.     Heart sounds: Normal heart sounds. No murmur heard.    No gallop.  Pulmonary:     Effort: Pulmonary effort is normal. No respiratory distress.     Breath sounds: Normal breath sounds. No wheezing.  Abdominal:     General: Abdomen is flat. Bowel sounds are normal. There is no distension.     Palpations: Abdomen is soft.     Tenderness: There is no abdominal tenderness.  Musculoskeletal:        General: Normal range of motion.     Cervical back: Normal range of motion and neck supple.     Right lower leg: No edema.     Left lower leg: No edema.  Skin:    General: Skin is warm and dry.     Capillary Refill: Capillary refill takes less than 2 seconds.  Neurological:     General: No focal deficit present.     Mental Status: He is alert and oriented to person, place, and time. Mental status is at baseline.     Motor: No weakness.     Gait: Gait normal.  Psychiatric:        Mood and Affect: Mood normal.        Thought Content: Thought content normal.        Judgment: Judgment normal.     Diabetic Foot Exam - Simple   No  data filed      Lab Results  Component Value Date   WBC 9.8 09/29/2022   HGB 14.4 09/29/2022   HCT 43.9 09/29/2022   PLT 336 09/29/2022   GLUCOSE 90 09/29/2022   CHOL 145 09/29/2022   TRIG 83 09/29/2022   HDL 50 09/29/2022   LDLCALC 79 09/29/2022   ALT 9 09/29/2022   AST 17 09/29/2022   NA 143 09/29/2022   K 4.9 09/29/2022   CL 102 09/29/2022   CREATININE 1.50 (H) 09/29/2022   BUN 20 09/29/2022   CO2 27 09/29/2022   TSH 13.600 (H) 09/29/2022   HGBA1C 6.0 (H) 09/29/2022   MICROALBUR 80 12/23/2020      Assessment & Plan:   Problem List Items Addressed This Visit        Cardiovascular and Mediastinum   Coronary artery disease of bypass graft of native heart with stable angina pectoris [facility]) An individual plan was formulated based on patient history and exam, labs and evidence based data. Patient has not had recent angina or nitroglycerin use. continue present treatment.     Essential hypertension An individual hypertension care plan was established and reinforced today.  The patient's status was assessed using clinical findings on exam and labs or diagnostic tests. The patient's success at meeting treatment goals on disease specific evidence-based guidelines and found to be well controlled. SELF MANAGEMENT: The patient and I together assessed ways to personally work towards obtaining the recommended goals. RECOMMENDATIONS: avoid decongestants found in common cold remedies, decrease consumption of alcohol, perform routine monitoring of BP with home BP cuff, exercise, reduction of dietary salt, take medicines as prescribed, try not to miss doses and quit smoking.  Regular exercise and maintaining a healthy weight is needed.  Stress reduction may help. A CLINICAL SUMMARY including written plan identify barriers to care unique to individual due to social or financial issues.  We attempt to mutually creat solutions for individual and family understanding.      Endocrine   Type 2 diabetes mellitus with other specified complication (HCC) - Primary An individual care plan for diabetes was established and reinforced today.  The patient's status was assessed using clinical findings on exam, labs and diagnostic testing. Patient success at meeting goals based on disease specific evidence-based guidelines and found to be good controlled. Medications were assessed and patient's understanding of the medical issues , including barriers were assessed. Recommend adherence to a diabetic diet, a graduated exercise program, HgbA1c level is checked quarterly, and urine microalbumin  performed yearly .  Annual mono-filament sensation testing performed. Lower blood pressure and control hyperlipidemia is important. Get annual eye exams and annual flu shots and smoking cessation discussed.  Self management goals were discussed.      Other   Hyperlipidemia AN INDIVIDUAL CARE PLAN for hyperlipidemia/ cholesterol was established and reinforced today.  The patient's status was assessed using clinical findings on exam, lab and other diagnostic tests. The patient's disease status was assessed based on evidence-based guidelines and found to be fair controlled. MEDICATIONS were reviewed. SELF MANAGEMENT GOALS have been discussed and patient's success at attaining the goal of low cholesterol was assessed. RECOMMENDATION given include regular exercise 3 days a week and low cholesterol/low fat diet. CLINICAL SUMMARY including written plan to identify barriers unique to the patient due to social or economic  reasons was discussed.    Other Visit Diagnoses     Need for immunization against influenza       Relevant Orders  Flu Vaccine QUAD High Dose(Fluad) (Completed)     .    Orders Placed This Encounter  Procedures   Flu Vaccine QUAD High Dose(Fluad)     Follow-up: Return in about 4 months (around 01/29/2023), or sally.  An After Visit Summary was printed and given to the patient.  Brent Bulla, MD [facility] [phone]

## 2022-09-30 ENCOUNTER — Other Ambulatory Visit: Payer: Self-pay | Admitting: Legal Medicine

## 2022-09-30 LAB — COMPREHENSIVE METABOLIC PANEL
ALT: 9 IU/L (ref 0–44)
AST: 17 IU/L (ref 0–40)
Albumin/Globulin Ratio: 1.8 (ref 1.2–2.2)
Albumin: 4.2 g/dL (ref 3.7–4.7)
Alkaline Phosphatase: 80 IU/L (ref 44–121)
BUN/Creatinine Ratio: 13 (ref 10–24)
BUN: 20 mg/dL (ref 8–27)
Bilirubin Total: 0.4 mg/dL (ref 0.0–1.2)
CO2: 27 mmol/L (ref 20–29)
Calcium: 9.1 mg/dL (ref 8.6–10.2)
Chloride: 102 mmol/L (ref 96–106)
Creatinine, Ser: 1.5 mg/dL — ABNORMAL HIGH (ref 0.76–1.27)
Globulin, Total: 2.3 g/dL (ref 1.5–4.5)
Glucose: 90 mg/dL (ref 70–99)
Potassium: 4.9 mmol/L (ref 3.5–5.2)
Sodium: 143 mmol/L (ref 134–144)
Total Protein: 6.5 g/dL (ref 6.0–8.5)
eGFR: 46 mL/min/{1.73_m2} — ABNORMAL LOW (ref >59)

## 2022-09-30 LAB — LIPID PANEL
Chol/HDL Ratio: 2.9 ratio (ref 0.0–5.0)
Cholesterol, Total: 145 mg/dL (ref 100–199)
HDL: 50 mg/dL (ref >39)
LDL Chol Calc (NIH): 79 mg/dL (ref 0–99)
Triglycerides: 83 mg/dL (ref 0–149)
VLDL Cholesterol Cal: 16 mg/dL (ref 5–40)

## 2022-09-30 LAB — CBC WITH DIFFERENTIAL/PLATELET
Basophils Absolute: 0.1 10*3/uL (ref 0.0–0.2)
Basos: 1 %
EOS (ABSOLUTE): 0.6 10*3/uL — ABNORMAL HIGH (ref 0.0–0.4)
Eos: 6 %
Hematocrit: 43.9 % (ref 37.5–51.0)
Hemoglobin: 14.4 g/dL (ref 13.0–17.7)
Immature Grans (Abs): 0 10*3/uL (ref 0.0–0.1)
Immature Granulocytes: 0 %
Lymphocytes Absolute: 3.1 10*3/uL (ref 0.7–3.1)
Lymphs: 32 %
MCH: 29.3 pg (ref 26.6–33.0)
MCHC: 32.8 g/dL (ref 31.5–35.7)
MCV: 89 fL (ref 79–97)
Monocytes Absolute: 1.1 10*3/uL — ABNORMAL HIGH (ref 0.1–0.9)
Monocytes: 11 %
Neutrophils Absolute: 4.8 10*3/uL (ref 1.4–7.0)
Neutrophils: 50 %
Platelets: 336 10*3/uL (ref 150–450)
RBC: 4.91 x10E6/uL (ref 4.14–5.80)
RDW: 13.7 % (ref 11.6–15.4)
WBC: 9.8 10*3/uL (ref 3.4–10.8)

## 2022-09-30 LAB — HEMOGLOBIN A1C
Est. average glucose Bld gHb Est-mCnc: 126 mg/dL
Hgb A1c MFr Bld: 6 % — ABNORMAL HIGH (ref 4.8–5.6)

## 2022-09-30 LAB — CARDIOVASCULAR RISK ASSESSMENT

## 2022-09-30 LAB — TSH: TSH: 13.6 u[IU]/mL — ABNORMAL HIGH (ref 0.450–4.500)

## 2022-09-30 NOTE — Progress Notes (Signed)
Kidney tests 3a/b, liver tests normal, A1c 6.0 good, CBC ok, TSH high 13.6 did he miss doses, if not he needs to increase to qd and check TSH in 6 weeks, Cholesterol normal,  lp

## 2022-10-01 ENCOUNTER — Telehealth: Payer: Self-pay

## 2022-10-01 ENCOUNTER — Other Ambulatory Visit: Payer: Self-pay

## 2022-10-01 DIAGNOSIS — E039 Hypothyroidism, unspecified: Secondary | ICD-10-CM

## 2022-10-01 MED ORDER — LEVOTHYROXINE SODIUM 150 MCG PO TABS: 150.0000 ug | ORAL_TABLET | Freq: Every day | ORAL | 0 refills | Status: DC

## 2022-10-01 NOTE — Progress Notes (Signed)
Prescription has been sent to [facility]

## 2022-10-01 NOTE — Telephone Encounter (Signed)
I spoke to patients wife and she stated that he has not missed any does of his Synthroid.  Can you send in rx for increase of medication to for patient.

## 2022-10-06 ENCOUNTER — Telehealth: Payer: Self-pay

## 2022-10-06 NOTE — Chronic Care Management (AMB) (Signed)
AZ&Me Notification:  Patient Approved 10/18/2022 - 10/18/2023 for Lokelma 10 g with [employer] patient assistance.      Billee Cashing, CMA Clinical Pharmacist Assistant [phone]'

## 2022-10-12 ENCOUNTER — Telehealth: Payer: Self-pay | Admitting: Legal Medicine

## 2022-10-12 NOTE — Telephone Encounter (Signed)
Left message for patient to call back and schedule Medicare Annual Wellness Visit (AWV) either virtually or phone . Left  my Zachery Conch number [phone]   Last AWV 05/08/20 please schedule with Nurse Health Adviser   45 min for awv-i and in office appointments 30 min for awv-s  phone/virtual appointments

## 2022-10-13 NOTE — Telephone Encounter (Signed)
Patient wife called back and would like for you to call her number [phone] to schedule appointment please

## 2022-10-14 NOTE — Telephone Encounter (Signed)
Awv appt 11/02/22 with Dahlia Client kim

## 2022-10-22 ENCOUNTER — Telehealth: Payer: Self-pay

## 2022-10-22 NOTE — Progress Notes (Unsigned)
Care Management & Coordination Services Pharmacy Team  Reason for Encounter: Hypertension  Contacted patient on 10/22/22 to discuss hypertension disease state.   Recent office visits:  09/29/22 [name] MD. Seen for routine visit. No med changes.   Recent consult visits:  09/27/22 (Nephrology) Gifford Shave MD. Seen for hyperkalemia. No med changes.   08/24/22 (Urology) Nicolette Bang MD. Seen for Hydronephrosis. No med changes  Hospital visits:  None  Medications: Outpatient Encounter Medications as of 10/22/2022  Medication Sig   ACCU-CHEK AVIVA PLUS test strip USE TWICE DAILY AS DIRECTED   Accu-Chek FastClix Lancets MISC 1 each by Does not apply route in the morning and at bedtime. Use to check blood sugar twice daily as directed.   Alcohol Swabs (ALCOHOL PREP) 70 % PADS 1 each by Does not apply route daily.   allopurinol (ZYLOPRIM) 100 MG tablet TAKE 1 TABLET EVERY DAY   aspirin EC 81 MG tablet Take 81 mg by mouth daily.   bethanechol (URECHOLINE) 10 MG tablet Take 1 tablet (10 mg total) by mouth in the morning and at bedtime.   Blood Glucose Calibration (ACCU-CHEK AVIVA) SOLN 1 each by In Vitro route every 30 (thirty) days.   Cholecalciferol (VITAMIN D-1000 MAX ST) 25 MCG (1000 UT) tablet Take 2,000 Units by mouth.    COLCRYS 0.6 MG tablet Prn gout flares   finasteride (PROSCAR) 5 MG tablet Take 1 tablet (5 mg total) by mouth daily.   levothyroxine (SYNTHROID) 150 MCG tablet Take 1 tablet (150 mcg total) by mouth daily before breakfast.   LOKELMA 10 g PACK packet Take 10 g by mouth daily.    meclizine (ANTIVERT) 25 MG tablet Take by mouth. Takes OTC prn dizziness.   metFORMIN (GLUCOPHAGE) 500 MG tablet TAKE 1 TABLET TWICE DAILY   metoprolol tartrate (LOPRESSOR) 25 MG tablet TAKE 1 TABLET TWICE DAILY   nitroGLYCERIN (NITROSTAT) 0.4 MG SL tablet Place 0.4 mg under the tongue every 5 (five) minutes as needed for chest pain.   simvastatin (ZOCOR) 20 MG tablet Take 1  tablet (20 mg total) by mouth daily.   sodium bicarbonate 650 MG tablet Take by mouth 2 (two) times daily.   vitamin B-12 (CYANOCOBALAMIN) 1000 MCG tablet Take 1,000 mcg by mouth daily.   No facility-administered encounter medications on file as of 10/22/2022.    Recent Office Vitals: BP Readings from Last 3 Encounters:  09/29/22 120/84  08/24/22 (!) 158/85  08/16/22 (!) 156/88   Pulse Readings from Last 3 Encounters:  09/29/22 61  08/24/22 65  08/16/22 62    Wt Readings from Last 3 Encounters:  09/29/22 173 lb (78.5 kg)  08/16/22 171 lb 3.2 oz (77.7 kg)  06/09/22 166 lb (75.3 kg)     Kidney Function Lab Results  Component Value Date/Time   CREATININE 1.50 (H) 09/29/2022 09:54 AM   CREATININE 1.62 (H) 06/22/2022 11:19 AM   GFRNONAA 43 (L) 08/19/2020 09:32 AM   GFRAA 50 (L) 08/19/2020 09:32 AM       Latest Ref Rng & Units 09/29/2022    9:54 AM 06/22/2022   11:19 AM 06/09/2022    9:54 AM  BMP  Glucose 70 - 99 mg/dL 90  97  93   BUN 8 - 27 mg/dL 20  20  19    Creatinine 0.76 - 1.27 mg/dL 1.50  1.62  1.62   BUN/Creat Ratio 10 - 24 13  12  12    Sodium 134 - 144 mmol/L 143  148  144   Potassium 3.5 - 5.2 mmol/L 4.9  4.8  6.0   Chloride 96 - 106 mmol/L 102  105  103   CO2 20 - 29 mmol/L 27  30  27    Calcium 8.6 - 10.2 mg/dL 9.1  9.1  9.2      Current antihypertensive regimen:  Metoprolol tartrate 25 mg bid  Patient verbally confirms he is taking the above medications as directed. {yes/no:20286}  How often are you checking your Blood Pressure? {CHL HP BP Monitoring Frequency:[phone]}  he checks his blood pressure {timing:25218} {before/after:25217} taking his medication.  Current home BP readings: ***  DATE:             BP               PULSE    Wrist or arm cuff: Caffeine intake: Salt intake: OTC medications including pseudoephedrine or NSAIDs?  Any readings above 180/120? {yes/no:20286} If yes any symptoms of hypertensive emergency? {hypertensive  emergency symptoms:25354}   What recent interventions/DTPs have been made by any provider to improve Blood Pressure control since last CPP Visit: ***  Any recent hospitalizations or ED visits since last visit with CPP? {yes/no:20286}  What diet changes have been made to improve Blood Pressure Control?  ***  What exercise is being done to improve your Blood Pressure Control?  ***  Adherence Review: Is the patient currently on ACE/ARB medication? No Does the patient have >5 day gap between last estimated fill dates? No  Star Rating Drugs:  Medication:  Last Fill: Day Supply None noted    Eulis Canner

## 2022-10-28 ENCOUNTER — Telehealth: Payer: Self-pay

## 2022-10-28 NOTE — Progress Notes (Cosign Needed)
AZ&ME Notification:  Patient approved for 2024 Enrollment with [employer] Patient Assistance Program for [place], [place] will end on October 18, 2023.   Pattricia Boss, [place] Pharmacist Assistant [phone]

## 2022-11-02 ENCOUNTER — Telehealth (INDEPENDENT_AMBULATORY_CARE_PROVIDER_SITE_OTHER): Payer: Medicare PPO | Admitting: Family Medicine

## 2022-11-02 VITALS — Ht 69.0 in | Wt 173.0 lb

## 2022-11-02 DIAGNOSIS — Z0000 Encounter for general adult medical examination without abnormal findings: Secondary | ICD-10-CM | POA: Diagnosis not present

## 2022-11-02 NOTE — Progress Notes (Signed)
PATIENT CHECK-IN and HEALTH RISK ASSESSMENT QUESTIONNAIRE:  -completed by phone/video for upcoming Medicare Preventive Visit  Pre-Visit Check-in: 1)Vitals (height, wt, BP, etc) - record in vitals section for visit on day of visit 2)Review and Update Medications, Allergies PMH, Surgeries, Social history in Epic 3)Hospitalizations in the last year with date/reason? no  4)Review and Update Care Team (patient's specialists) in Epic 5) Complete PHQ9 in Epic  6) Complete Fall Screening in Epic 7)Review all Health Maintenance Due and order under PCP if not done.  Medicare Wellness Patient Questionnaire:  Answer theses question about your habits: Do you drink alcohol? no If yes, how many drinks do you have a day? Have you ever smoked?no Quit date if applicable? N/a  How many packs a day do/did you smoke? N/a Do you use smokeless tobacco?no Do you use an illicit drugs?no Do you exercises? Walks dog an hour several times per week when at daughters, pretty active caring for the yard at home Are you sexually active? Somewhat Number of partners?1 Typical breakfast: cornflakes cereal Typical lunch:apple, cracker, popcorn Typical dinner: chicken, steak, beans, potatoes, vegetable Typical snacks:, candy, ice cream, pies  Beverages: coffee, tea, water  Answer theses question about you: Can you perform most household chores?yes, limited Do you find it hard to follow a conversation in a noisy room?yes, has hearing aids  Do you often ask people to speak up or repeat themselves?yes Do you feel that you have a problem with memory?no Do you balance your checkbook and or bank acounts?no Do you feel safe at home?yes Last dentist visit? years ago. Unable to recall a date.  Do you need assistance with any of the following: Please note if so   Driving? no  Feeding yourself? no  Getting from bed to chair? no  Getting to the toilet?no  Bathing or showering? no  Dressing yourself?no  Managing money?yes,  wife does  Climbing a flight of stairs no  Preparing meals? Yes, wife does this    Do you have Advanced Directives in place (Living Will, Healthcare Power or [place])? Yes   Last eye Exam and location?Last February  with [facility]. Has upcoming visit in February 13.    Do you currently use prescribed or non-prescribed narcotic or opioid pain medications?no  Do you have a history or close family history of breast, ovarian, tubal or peritoneal cancer or a family member with BRCA (breast cancer susceptibility 1 and 2) gene mutations? NO  Nurse/Assistant Credentials/time stamp: Karpuih M./CMA/3:42pm   ----------------------------------------------------------------------------------------------------------------------------------------------------------------------------------------------------------------------    MEDICARE ANNUAL PREVENTIVE CARE VISIT WITH PROVIDER (Welcome to Medicare, initial annual wellness or annual wellness exam)  Virtual Visit via Phone Note  I connected with David Baxter  on 11/02/22 by phone and verified that I am speaking with the correct person using two identifiers.  Location patient: home Location provider:work or home office Persons participating in the virtual visit: patient, provider  Concerns and/or follow up today: none. Reports stable.    See HM section in Epic for other details of completed HM.    ROS: negative for report of fevers, unintentional weight loss, vision changes, vision loss, hearing loss or change, chest pain, sob, hemoptysis, melena, hematochezia, hematuria, genital discharge or lesions, falls, bleeding or bruising, loc, thoughts of suicide or self harm, memory loss  Patient-completed extensive health risk assessment - reviewed and discussed with the patient: See Health Risk Assessment completed with patient prior to the visit either above or in recent phone note. This was reviewed in detailed  with the patient today and  appropriate recommendations, orders and referrals were placed as needed per Summary below and patient instructions.   Review of Medical History: -PMH, PSH, Family History and current specialty and care providers reviewed and updated and listed below   Patient Care Team: Abigail MiyamotoPerry, Lawrence Edward, MD (Inactive) as PCP - General (Family Medicine) Zettie PhoKennedy, Nathan K, [facility] (Pharmacist) Thornton ParkAdegoroye, Adeyinka A, MD (Specialist)   Past Medical History:  Diagnosis Date   Acquired hypothyroidism 04/08/2018   Acute retention of urine 04/09/2018   Acute sinusitis 06/05/2020   AKI (acute kidney injury) (HCC) 04/08/2018   Arthralgia of both lower legs    BMI 23.0-23.9, adult 04/21/2020   BPH (benign prostatic hyperplasia)    BPH with urinary obstruction 12/05/2019   CAD (coronary artery disease)    cardiologist--  dr Dulce Sellarmunley w/ [facility] [place]   CKD (chronic kidney disease) stage 3, GFR 30-59 ml/min (HCC) 07/06/2015   CKD (chronic kidney disease), stage III (HCC)    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 DES OM 2012    2. 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   SIRS (systemic inflammatory response syndrome) (HCC) 04/08/2018   Type 2 diabetes mellitus (HCC) 04/08/2018   UTI (urinary tract infection) 04/08/2018    Past Surgical History:  Procedure Laterality Date   CORONARY ANGIOPLASTY WITH STENT PLACEMENT  03/04/2015    x2 DES to CFX   CORONARY ANGIOPLASTY WITH STENT PLACEMENT  2012   x1 DES to OM   CYSTOSCOPY WITH INSERTION OF UROLIFT N/A 02/14/2017   Procedure: CYSTOSCOPY WITH INSERTION OF UROLIFT;  Surgeon: Malen GauzePatrick L McKenzie, MD;  [facility];  Service: Urology;  Laterality: N/A;   EXTRACORPOREAL SHOCK WAVE LITHOTRIPSY  1990's   PERCUTANEOUS NEPHROSTOLITHOTOMY  1991   and abdominal hernia repair   URETEROLITHOTOMY  1991 and 1994    Social History   Socioeconomic History   Marital status: Married    Spouse name: Not on file   Number of children: Not on file   Years of education: Not on file   Highest education level: Not on file  Occupational History   Occupation: truck driver  Tobacco Use   Smoking status: Never    Passive exposure: Never   Smokeless tobacco: Never  Vaping Use   Vaping Use: Never used  Substance and Sexual Activity   Alcohol use: No   Drug use: No   Sexual activity: Yes    Partners: Female  Other Topics Concern   Not on file  Social History Narrative   Not on file   Social Determinants of Health   Financial Resource Strain: High Risk (08/18/2022)   Overall Financial Resource Strain (CARDIA)    Difficulty of Paying Living Expenses: Hard  Food Insecurity: No Food Insecurity (06/11/2020)  Hunger Vital Sign    Worried About Running Out of Food in the Last Year: Never true    Ran Out of Food in the Last Year: Never true  Transportation Needs: No Transportation Needs (08/18/2022)   PRAPARE - Hydrologist (Medical): No    Lack of Transportation (Non-Medical): No  Physical Activity: Not on file  Stress: Not on file  Social Connections: Not on file  Intimate Partner Violence: Not on file    Family History  Problem Relation Age of Onset   Heart disease Mother 62       After age 22 - CHF   Hypertension Father    Heart disease Father 78       After age 57 - CHF    Current Outpatient Medications on File  Prior to Visit  Medication Sig Dispense Refill   ACCU-CHEK AVIVA PLUS test strip USE TWICE DAILY AS DIRECTED 200 strip 10   Accu-Chek FastClix Lancets MISC 1 each by Does not apply route in the morning and at bedtime. Use to check blood sugar twice daily as directed. 100 each 6   Alcohol Swabs (ALCOHOL PREP) 70 % PADS 1 each by Does not apply route daily. 100 each 2   allopurinol (ZYLOPRIM) 100 MG tablet TAKE 1 TABLET EVERY DAY 90 tablet 2   aspirin EC 81 MG tablet Take 81 mg by mouth daily.     bethanechol (URECHOLINE) 10 MG tablet Take 1 tablet (10 mg total) by mouth in the morning and at bedtime. 180 tablet 3   Blood Glucose Calibration (ACCU-CHEK AVIVA) SOLN 1 each by In Vitro route every 30 (thirty) days. 1 each 5   Cholecalciferol (VITAMIN D-1000 MAX ST) 25 MCG (1000 UT) tablet Take 2,000 Units by mouth.      COLCRYS 0.6 MG tablet Prn gout flares     finasteride (PROSCAR) 5 MG tablet Take 1 tablet (5 mg total) by mouth daily. 90 tablet 3   levothyroxine (SYNTHROID) 150 MCG tablet Take 1 tablet (150 mcg total) by mouth daily before breakfast. 90 tablet 0   LOKELMA 10 g PACK packet Take 10 g by mouth daily.      metFORMIN (GLUCOPHAGE) 500 MG tablet TAKE 1 TABLET TWICE DAILY 180 tablet 2   metoprolol tartrate (LOPRESSOR) 25 MG tablet TAKE 1 TABLET TWICE DAILY 180 tablet 2   nitroGLYCERIN (NITROSTAT) 0.4 MG SL tablet Place 0.4 mg under the tongue every 5 (five) minutes as needed for chest pain.     simvastatin (ZOCOR) 20 MG tablet Take 1 tablet (20 mg total) by mouth daily. 90 tablet 1   sodium bicarbonate 650 MG tablet Take by mouth 2 (two) times daily.     vitamin B-12 (CYANOCOBALAMIN) 1000 MCG tablet Take 1,000 mcg by mouth daily.     meclizine (ANTIVERT) 25 MG tablet Take by mouth. Takes OTC prn dizziness. (Patient not taking: Reported on 11/02/2022)     No current facility-administered medications on file prior to visit.    Allergies  Allergen Reactions   Tamsulosin Other (See  Comments)   Aspirin Other (See Comments)    Upset stomach       Physical Exam There were no vitals filed for this visit. Estimated body mass index is 25.55 kg/m as calculated from the following:   Height as of this encounter: 5\' 9"  (1.753 m).   Weight as of this encounter: 173 lb (78.5 kg).  EKG (optional): deferred due to virtual  visit  GENERAL: alert, oriented, no audible sounds of distress, full vision exam deferred due to pandemic and/or virtual encounter  PSYCH/NEURO: pleasant and cooperative, no obvious depression or anxiety, speech and thought processing grossly intact, Cognitive function grossly intact  Flowsheet Row Video Visit from 11/02/2022 in [facility]  PHQ-9 Total Score 0           11/02/2022    3:32 PM 01/26/2022    8:08 AM 09/22/2021    8:24 AM 05/11/2020    7:00 PM 04/18/2020    7:50 AM  Depression screen PHQ 2/9  Decreased Interest 0 0 0 0 0  Down, Depressed, Hopeless 0 0 0 0 0  PHQ - 2 Score 0 0 0 0 0  Altered sleeping 0      Tired, decreased energy 0      Change in appetite 0      Feeling bad or failure about yourself  0      Trouble concentrating 0      Moving slowly or fidgety/restless 0      Suicidal thoughts 0      PHQ-9 Score 0           04/21/2020    9:58 AM 05/11/2020    7:00 PM 12/23/2020    8:13 AM 01/26/2022    8:07 AM 11/02/2022    3:32 PM  Fall Risk  Falls in the past year? 0 0 1 0 0  Was there an injury with Fall? 0 0 0 0 0  Fall Risk Category Calculator 0 0 1 0 0  Fall Risk Category (Retired) Low Low Low Low   (RETIRED) Patient Fall Risk Level Low fall risk Low fall risk Low fall risk Low fall risk   Patient at Risk for Falls Due to  No Fall Risks History of fall(s);Impaired balance/gait  No Fall Risks  Fall risk Follow up Falls evaluation completed Falls evaluation completed;Education provided;Falls prevention discussed Falls prevention discussed  Falls evaluation completed     SUMMARY AND PLAN:  Medicare  annual wellness visit, subsequent   Discussed applicable health maintenance/preventive health measures and advised and referred or ordered per patient preferences:  Health Maintenance  Topic Date Due   DTaP/Tdap/Td (1 - Tdap) Not done in the last 10 years, they are considering. Let them know can get at pharmacy or at office.    Zoster Vaccines- Shingrix (1 of 2) Never done, considering   Pneumonia Vaccine 52+ Years old (1 - PCV) Had a pneumonia shot about 10 years ago, not sure exact date. Discussed options. They plan to do at pharmacy.    Medicare Annual Wellness (AWV)  Done today   COVID-19 Vaccine (5 - 2023-24 season) 06/18/2022, discussed, answered questions, let them know could do at pharmacy.    OPHTHALMOLOGY EXAM  11/24/2022   Diabetic kidney evaluation - Urine ACR  01/27/2023   HEMOGLOBIN A1C  03/31/2023   FOOT EXAM  06/10/2023   Diabetic kidney evaluation - eGFR measurement  09/30/2023   INFLUENZA VACCINE  Completed   HPV VACCINES  Aged [employer] and counseling on the following was provided based on the above review of health and a plan/checklist for the patient, along with additional information discussed, was provided for the patient in the patient instructions :  -has hearing aids -between PCPs but they have new PCP visit set up -Provided counseling and plan for increased risk of falling if applicable per above screening.  -Advised and  counseled on maintaining healthy weight and healthy lifestyle - including the importance of a health diet, regular physical activity -info on social connections and stress management provided in handout -congratulated on staying active and homecooking - provided healthy diet and exercise for adults guidelines -Advise yearly dental visits at minimum and regular eye exams  Follow up: see patient instructions   Patient Instructions  I really enjoyed getting to talk with you today! I am available on Tuesdays and Thursdays for virtual  visits if you have any questions or concerns, or if I can be of any further assistance.   CHECKLIST FROM ANNUAL WELLNESS VISIT:  -Follow up (please call to schedule if not scheduled after visit):  -Inperson visit with your Primary Doctor office: As scheduled to meet your new doctor -yearly for annual wellness visit with primary care office  Here is a list of your preventive care/health maintenance measures and the plan for each if any are due:  Health Maintenance  Topic Date Due   DTaP/Tdap/Td (1 - Tdap) Never done   Zoster Vaccines- Shingrix (1 of 2) Never done   Pneumonia Vaccine 10+ Years old (1 - PCV) Never done   COVID-19 Vaccine (5 - 2023-24 season) 06/18/2022   OPHTHALMOLOGY EXAM  11/24/2022   Diabetic kidney evaluation - Urine ACR  01/27/2023   HEMOGLOBIN A1C  03/31/2023   FOOT EXAM  06/10/2023   Diabetic kidney evaluation - eGFR measurement  09/30/2023   Medicare Annual Wellness (AWV)  11/03/2023   INFLUENZA VACCINE  Completed   HPV VACCINES  Aged Out  Can get the vaccines at the pharmacy.  -See a dentist at least yearly  -Get your eyes checked and then per your eye specialist's recommendations  -Other issues addressed today:  -I have included below further information regarding a healthy whole foods based diet, physical activity guidelines for adults, stress management and opportunities for social connections. I hope you find this information useful.   -----------------------------------------------------------------------------------------------------------------------------------------------------------------------------------------------------------------------------------------------------------  NUTRITION: -eat real food: lots of colorful vegetables (half the plate) and fruits -5-7 servings of vegetables and fruits per day (fresh or steamed is best), exp. 2 servings of vegetables with lunch and dinner and 2 servings of fruit per day. Berries and greens such as  kale and collards are great choices.  -consume on a regular basis: whole grains (make sure first ingredient on label contains the word "whole"), fresh fruits, fish, nuts, seeds, healthy oils (such as olive oil, avocado oil, grape seed oil) -may eat small amounts of dairy and lean meat on occasion, but avoid processed meats such as ham, bacon, lunch meat, etc. -drink water -try to avoid fast food and pre-packaged foods, processed meat -most experts advise limiting sodium to < 2300mg  per day, should limit further is any chronic conditions such as high blood pressure, heart disease, diabetes, etc. The American Heart Association advised that < 1500mg  is is ideal -try to avoid foods that contain any ingredients with names you do not recognize  -try to avoid sugar/sweets (except for the natural sugar that occurs in fresh fruit) -try to avoid sweet drinks -try to avoid white rice, white bread, pasta (unless whole grain), white or yellow potatoes  EXERCISE GUIDELINES FOR ADULTS: -if you wish to increase your physical activity, do so gradually and with the approval of your doctor -STOP and seek medical care immediately if you have any chest pain, chest discomfort or trouble breathing when starting or increasing exercise  -move and stretch your body, legs, feet and  arms when sitting for long periods -Physical activity guidelines for optimal health in adults: -least 150 minutes per week of aerobic exercise (can talk, but not sing) once approved by your doctor, 20-30 minutes of sustained activity or two 10 minute episodes of sustained activity every day.  -resistance training at least 2 days per week if approved by your doctor -balance exercises 3+ days per week:   Stand somewhere where you have something sturdy to hold onto if you lose balance.    1) lift up on toes, start with 5x per day and work up to 20x   2) stand and lift on leg straight out to the side so that foot is a few inches of the floor, start  with 5x each side and work up to 20x each side   3) stand on one foot, start with 5 seconds each side and work up to 20 seconds on each side  If you need ideas or help with getting more active:  -Silver sneakers https://tools.silversneakers.com  -Walk with a Doc: http://stephens-thompson.biz/  -try to include resistance (weight lifting/strength building) and balance exercises twice per week: or the following link for ideas: ChessContest.fr  UpdateClothing.com.cy  STRESS MANAGEMENT: -can try meditating, or just sitting quietly with deep breathing while intentionally relaxing all parts of your body for 5 minutes daily -if you need further help with stress, anxiety or depression please follow up with your primary doctor or contact the wonderful folks at [place]: [place]: -options in [place] if you wish to engage in more social and exercise related activities:  -Silver sneakers https://tools.silversneakers.com  -Walk with a Doc: http://stephens-thompson.biz/  -Check out the [place] 50+ section on the [employer] (hiking clubs, book clubs, cards and games, chess, exercise classes, aquatic classes and much more) - see the website for details: https://www.[place].gov/departments/parks-recreation/active-adults50  -YouTube has lots of exercise videos for different ages and abilities as well  -[place] (a variety of indoor and outdoor inperson activities for adults). 859-708-4799. [address]  -Virtual Online Classes (a variety of topics): see seniorplanet.org or call ([phone]  -consider volunteering at a school, hospice center, church, senior center or elsewhere           Lucretia Kern, DO

## 2022-11-02 NOTE — Patient Instructions (Addendum)
I really enjoyed getting to talk with you today! I am available on Tuesdays and Thursdays for virtual visits if you have any questions or concerns, or if I can be of any further assistance.   CHECKLIST FROM ANNUAL WELLNESS VISIT:  -Follow up (please call to schedule if not scheduled after visit):  -Inperson visit with your Primary Doctor office: As scheduled to meet your new doctor -yearly for annual wellness visit with primary care office  Here is a list of your preventive care/health maintenance measures and the plan for each if any are due:  Health Maintenance  Topic Date Due   DTaP/Tdap/Td (1 - Tdap) Never done   Zoster Vaccines- Shingrix (1 of 2) Never done   Pneumonia Vaccine 69+ Years old (1 - PCV) Never done   COVID-19 Vaccine (5 - 2023-24 season) 06/18/2022   OPHTHALMOLOGY EXAM  11/24/2022   Diabetic kidney evaluation - Urine ACR  01/27/2023   HEMOGLOBIN A1C  03/31/2023   FOOT EXAM  06/10/2023   Diabetic kidney evaluation - eGFR measurement  09/30/2023   Medicare Annual Wellness (AWV)  11/03/2023   INFLUENZA VACCINE  Completed   HPV VACCINES  Aged Out  Can get the vaccines at the pharmacy.  -See a dentist at least yearly  -Get your eyes checked and then per your eye specialist's recommendations  -Other issues addressed today:  -I have included below further information regarding a healthy whole foods based diet, physical activity guidelines for adults, stress management and opportunities for social connections. I hope you find this information useful.   -----------------------------------------------------------------------------------------------------------------------------------------------------------------------------------------------------------------------------------------------------------  NUTRITION: -eat real food: lots of colorful vegetables (half the plate) and fruits -5-7 servings of vegetables and fruits per day (fresh or steamed is best), exp. 2  servings of vegetables with lunch and dinner and 2 servings of fruit per day. Berries and greens such as kale and collards are great choices.  -consume on a regular basis: whole grains (make sure first ingredient on label contains the word "whole"), fresh fruits, fish, nuts, seeds, healthy oils (such as olive oil, avocado oil, grape seed oil) -may eat small amounts of dairy and lean meat on occasion, but avoid processed meats such as ham, bacon, lunch meat, etc. -drink water -try to avoid fast food and pre-packaged foods, processed meat -most experts advise limiting sodium to < 2300mg  per day, should limit further is any chronic conditions such as high blood pressure, heart disease, diabetes, etc. The American Heart Association advised that < 1500mg  is is ideal -try to avoid foods that contain any ingredients with names you do not recognize  -try to avoid sugar/sweets (except for the natural sugar that occurs in fresh fruit) -try to avoid sweet drinks -try to avoid white rice, white bread, pasta (unless whole grain), white or yellow potatoes  EXERCISE GUIDELINES FOR ADULTS: -if you wish to increase your physical activity, do so gradually and with the approval of your doctor -STOP and seek medical care immediately if you have any chest pain, chest discomfort or trouble breathing when starting or increasing exercise  -move and stretch your body, legs, feet and arms when sitting for long periods -Physical activity guidelines for optimal health in adults: -least 150 minutes per week of aerobic exercise (can talk, but not sing) once approved by your doctor, 20-30 minutes of sustained activity or two 10 minute episodes of sustained activity every day.  -resistance training at least 2 days per week if approved by your doctor -balance exercises 3+ days per  week:   Stand somewhere where you have something sturdy to hold onto if you lose balance.    1) lift up on toes, start with 5x per day and work up to  20x   2) stand and lift on leg straight out to the side so that foot is a few inches of the floor, start with 5x each side and work up to 20x each side   3) stand on one foot, start with 5 seconds each side and work up to 20 seconds on each side  If you need ideas or help with getting more active:  -Silver sneakers https://tools.silversneakers.com  -Walk with a Doc: http://stephens-thompson.biz/  -try to include resistance (weight lifting/strength building) and balance exercises twice per week: or the following link for ideas: ChessContest.fr  UpdateClothing.com.cy  STRESS MANAGEMENT: -can try meditating, or just sitting quietly with deep breathing while intentionally relaxing all parts of your body for 5 minutes daily -if you need further help with stress, anxiety or depression please follow up with your primary doctor or contact the wonderful folks at [place]: [place]: -options in [place] if you wish to engage in more social and exercise related activities:  -Silver sneakers https://tools.silversneakers.com  -Walk with a Doc: http://stephens-thompson.biz/  -Check out the [place] 50+ section on the [employer] (hiking clubs, book clubs, cards and games, chess, exercise classes, aquatic classes and much more) - see the website for details: https://www.[place].gov/departments/parks-recreation/active-adults50  -YouTube has lots of exercise videos for different ages and abilities as well  -[place] (a variety of indoor and outdoor inperson activities for adults). ([phone]) [phone]. [address]  -Virtual Online Classes (a variety of topics): see seniorplanet.org or call ([phone])087-3387  -consider volunteering at a school, hospice center, church, senior center or elsewhere

## 2022-11-07 ENCOUNTER — Other Ambulatory Visit: Payer: Self-pay | Admitting: Family Medicine

## 2022-11-19 LAB — HM DIABETES EYE EXAM

## 2022-11-21 ENCOUNTER — Other Ambulatory Visit: Payer: Self-pay | Admitting: Family Medicine

## 2022-11-22 ENCOUNTER — Telehealth: Payer: Self-pay

## 2022-11-22 NOTE — Progress Notes (Signed)
Care Management & Coordination Services Pharmacy Team  Reason for Encounter: Hypertension  Contacted patient to discuss hypertension disease state.  Spoke with family on 12/09/2022     Current antihypertensive regimen:  Metoprolol tartrate 25 mg bid  Patient verbally confirms he is taking the above medications as directed. Yes  How often are you checking your Blood Pressure? twice daily  he checks his blood pressure in the morning and at nighttime before taking his medication.  Current home BP readings:  12/02/22 144/68 73 morning 147/68 73 evening 12/03/22 144/71 71 12/05/22 157/73 65 12/06/22 143/63 73 12/07/22 157/87 81  Wrist or arm cuff: Arm  Caffeine intake:2 cups of coffee  Salt intake:Limited OTC medications including pseudoephedrine or NSAIDs? None  Any readings above 180/100? No  What recent interventions/DTPs have been made by any provider to improve Blood Pressure control since last CPP Visit: No changes   Any recent hospitalizations or ED visits since last visit with CPP? No  What diet changes have been made to improve Blood Pressure Control?  No diet changes   What exercise is being done to improve your Blood Pressure Control?  Pt is very active with building decks and walks dog daily   Adherence Review: Is the patient currently on ACE/ARB medication? No Does the patient have >5 day gap between last estimated fill dates? No  Star Rating Drugs:  Medication:  Last Fill: Day Supply None noted   Chart Updates: Recent office visits:  None  Recent consult visits:  11/02/22 ([place]) Colin Benton DO. Seen for Medicare Annual Wellness. No med changes.   Hospital visits:  None  Medications: Outpatient Encounter Medications as of 11/22/2022  Medication Sig   ACCU-CHEK AVIVA PLUS test strip USE TWICE DAILY AS DIRECTED   Accu-Chek FastClix Lancets MISC 1 each by Does not apply route in the morning and at bedtime. Use to check blood sugar twice daily as  directed.   Alcohol Swabs (ALCOHOL PREP) 70 % PADS 1 each by Does not apply route daily.   allopurinol (ZYLOPRIM) 100 MG tablet TAKE 1 TABLET EVERY DAY   aspirin EC 81 MG tablet Take 81 mg by mouth daily.   bethanechol (URECHOLINE) 10 MG tablet Take 1 tablet (10 mg total) by mouth in the morning and at bedtime.   Blood Glucose Calibration (ACCU-CHEK AVIVA) SOLN 1 each by In Vitro route every 30 (thirty) days.   Cholecalciferol (VITAMIN D-1000 MAX ST) 25 MCG (1000 UT) tablet Take 2,000 Units by mouth.    COLCRYS 0.6 MG tablet Prn gout flares   finasteride (PROSCAR) 5 MG tablet Take 1 tablet (5 mg total) by mouth daily.   levothyroxine (SYNTHROID) 150 MCG tablet Take 1 tablet (150 mcg total) by mouth daily before breakfast.   LOKELMA 10 g PACK packet Take 10 g by mouth daily.    meclizine (ANTIVERT) 25 MG tablet Take by mouth. Takes OTC prn dizziness. (Patient not taking: Reported on 11/02/2022)   metFORMIN (GLUCOPHAGE) 500 MG tablet TAKE 1 TABLET TWICE DAILY   metoprolol tartrate (LOPRESSOR) 25 MG tablet TAKE 1 TABLET TWICE DAILY   nitroGLYCERIN (NITROSTAT) 0.4 MG SL tablet Place 0.4 mg under the tongue every 5 (five) minutes as needed for chest pain.   simvastatin (ZOCOR) 20 MG tablet Take 1 tablet (20 mg total) by mouth daily.   sodium bicarbonate 650 MG tablet Take by mouth 2 (two) times daily.   vitamin B-12 (CYANOCOBALAMIN) 1000 MCG tablet Take 1,000 mcg by mouth daily.  No facility-administered encounter medications on file as of 11/22/2022.    Recent Office Vitals: BP Readings from Last 3 Encounters:  09/29/22 120/84  08/24/22 (!) 158/85  08/16/22 (!) 156/88   Pulse Readings from Last 3 Encounters:  09/29/22 61  08/24/22 65  08/16/22 62    Wt Readings from Last 3 Encounters:  11/02/22 173 lb (78.5 kg)  09/29/22 173 lb (78.5 kg)  08/16/22 171 lb 3.2 oz (77.7 kg)     Kidney Function Lab Results  Component Value Date/Time   CREATININE 1.50 (H) 09/29/2022 09:54 AM    CREATININE 1.62 (H) 06/22/2022 11:19 AM   GFRNONAA 43 (L) 08/19/2020 09:32 AM   GFRAA 50 (L) 08/19/2020 09:32 AM       Latest Ref Rng & Units 09/29/2022    9:54 AM 06/22/2022   11:19 AM 06/09/2022    9:54 AM  BMP  Glucose 70 - 99 mg/dL 90  97  93   BUN 8 - 27 mg/dL '20  20  19   '$ Creatinine 0.76 - 1.27 mg/dL 1.50  1.62  1.62   BUN/Creat Ratio 10 - '24 13  12  12   '$ Sodium 134 - 144 mmol/L 143  148  144   Potassium 3.5 - 5.2 mmol/L 4.9  4.8  6.0   Chloride 96 - 106 mmol/L 102  105  103   CO2 20 - 29 mmol/L '27  30  27   '$ Calcium 8.6 - 10.2 mg/dL 9.1  9.1  9.2      Elray Mcgregor, [place] Pharmacist Assistant  [phone]

## 2022-11-25 ENCOUNTER — Other Ambulatory Visit: Payer: Medicare PPO

## 2022-11-25 ENCOUNTER — Other Ambulatory Visit: Payer: Self-pay | Admitting: Physician Assistant

## 2022-11-25 DIAGNOSIS — E039 Hypothyroidism, unspecified: Secondary | ICD-10-CM

## 2022-11-26 LAB — TSH: TSH: 3.71 u[IU]/mL (ref 0.450–4.500)

## 2022-11-30 ENCOUNTER — Ambulatory Visit: Payer: Medicare PPO | Admitting: Physician Assistant

## 2022-11-30 IMAGING — US US RENAL
1 series · 14 of 25 positions shown · non-contrast
Comparison: 09/23/2021

CLINICAL DATA: Follow-up of hydronephrosis

EXAM:
RENAL / URINARY TRACT ULTRASOUND COMPLETE

[Series 1: us renal · 14 of 67 slices shown]
[im 1/67]
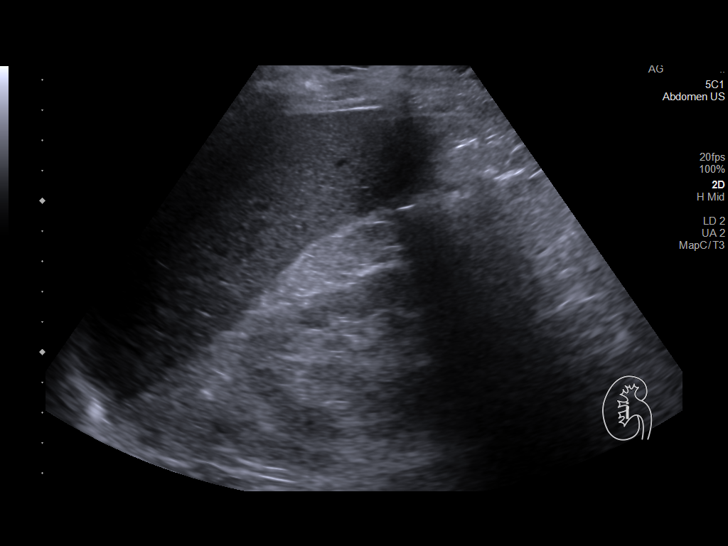
[im 6/67]
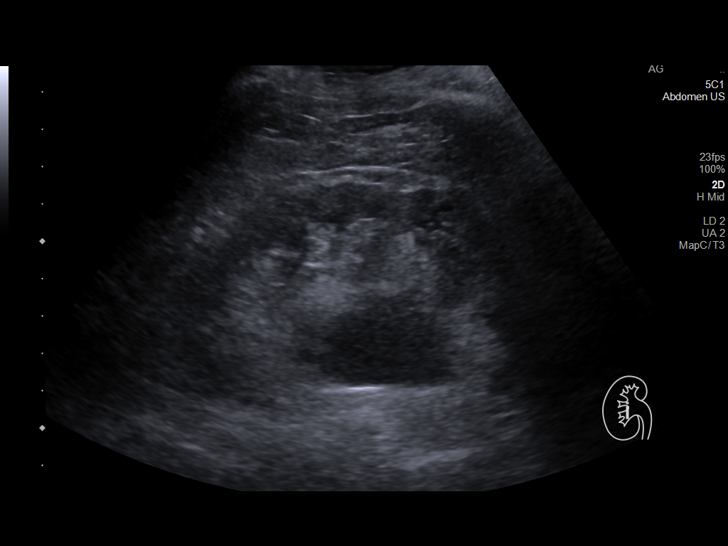
[im 12/67]
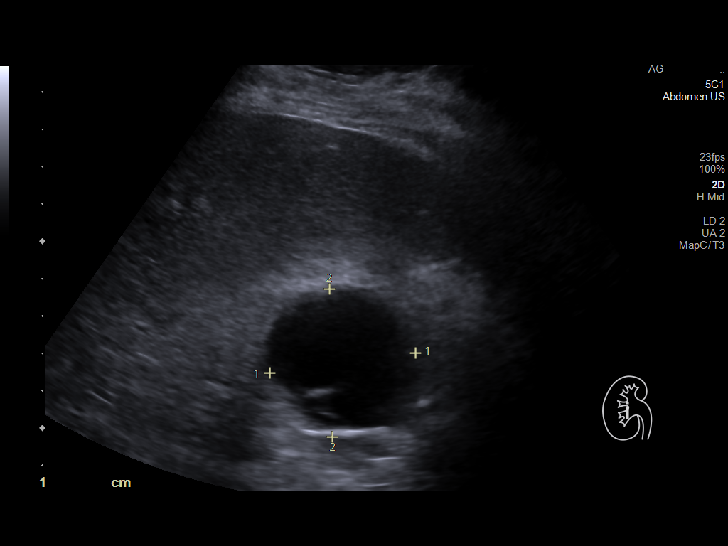
[im 17/67]
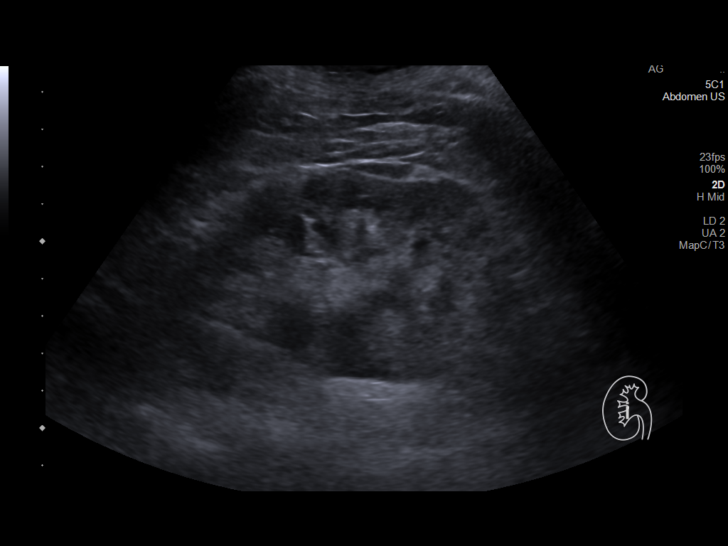
[im 23/67]
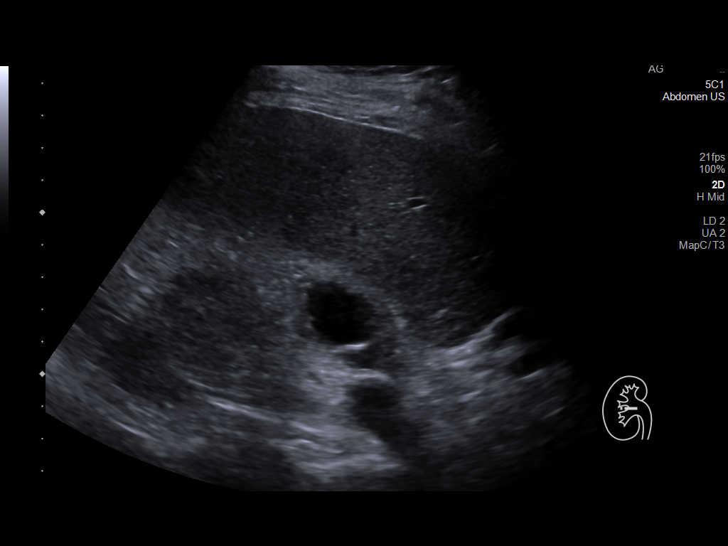
[im 25/67]
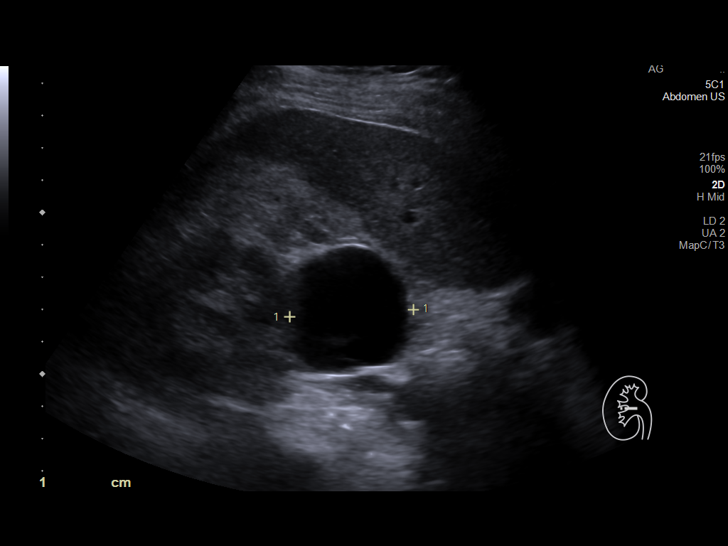
[im 31/67]
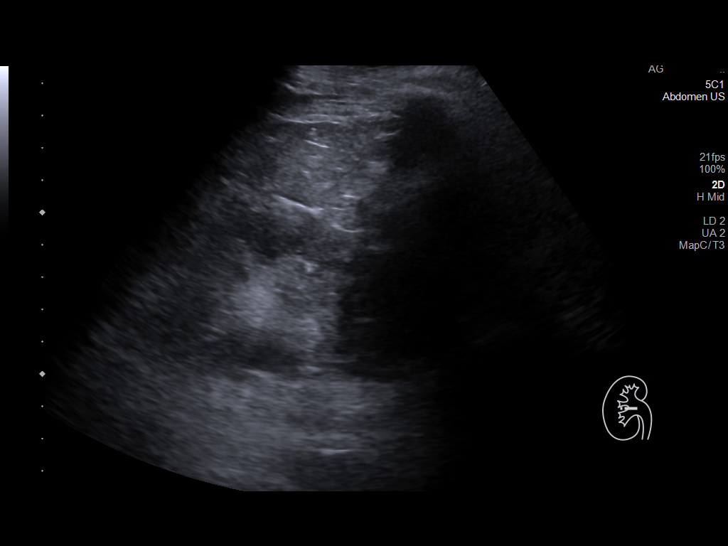
[im 36/67]
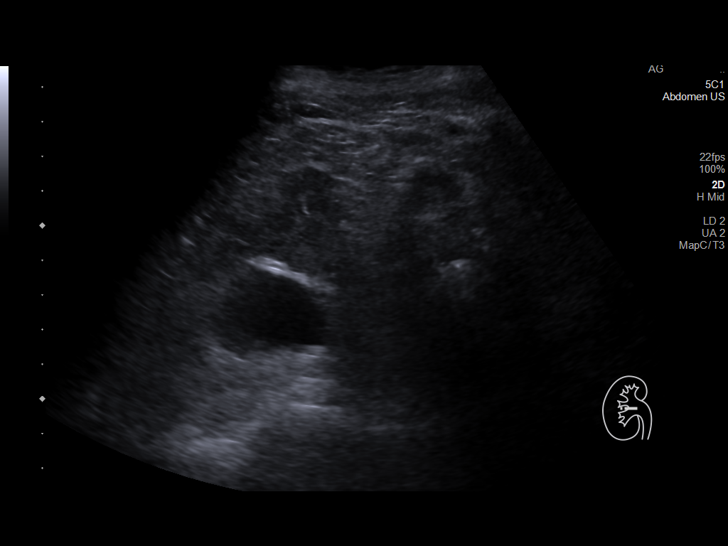
[im 42/67]
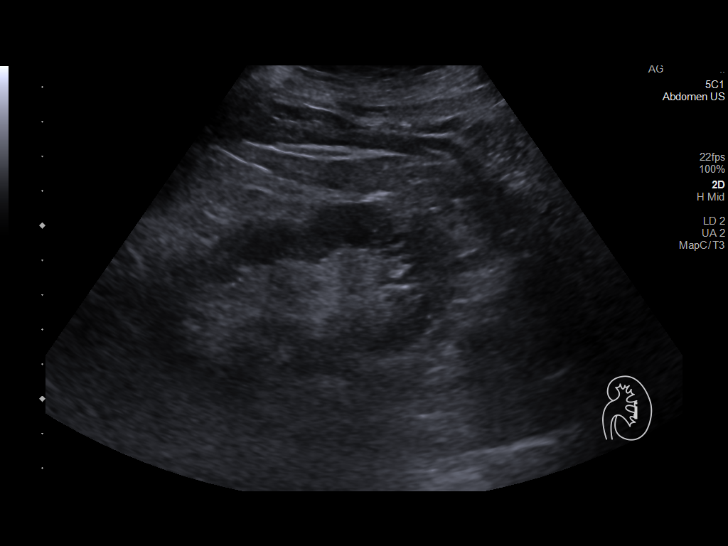
[im 45/67]
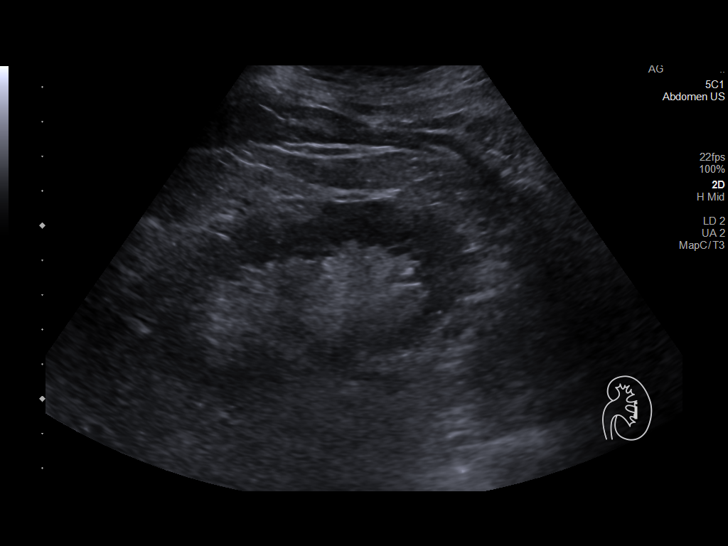
[im 50/67]
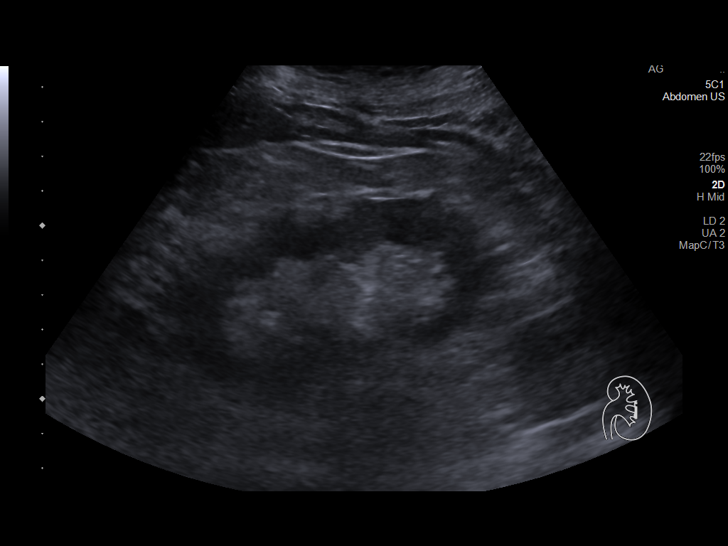
[im 56/67]
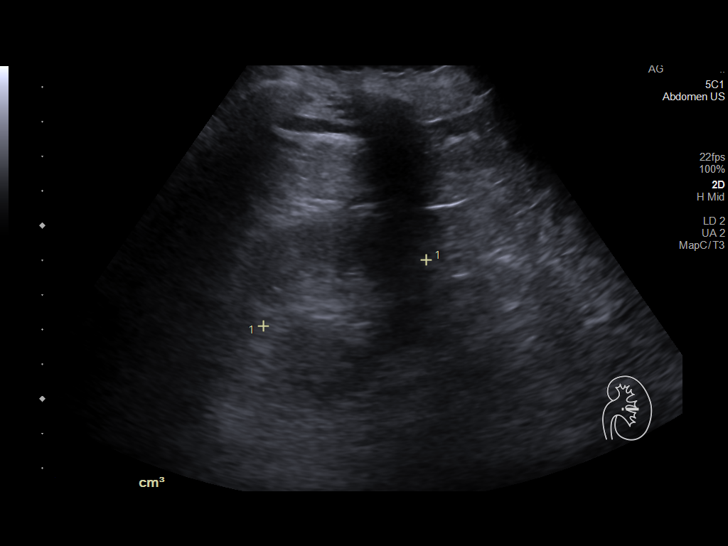
[im 61/67]
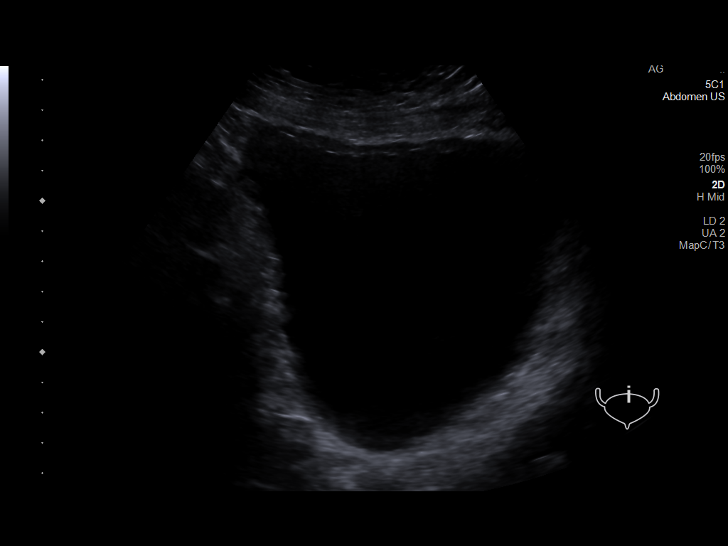
[im 67/67]
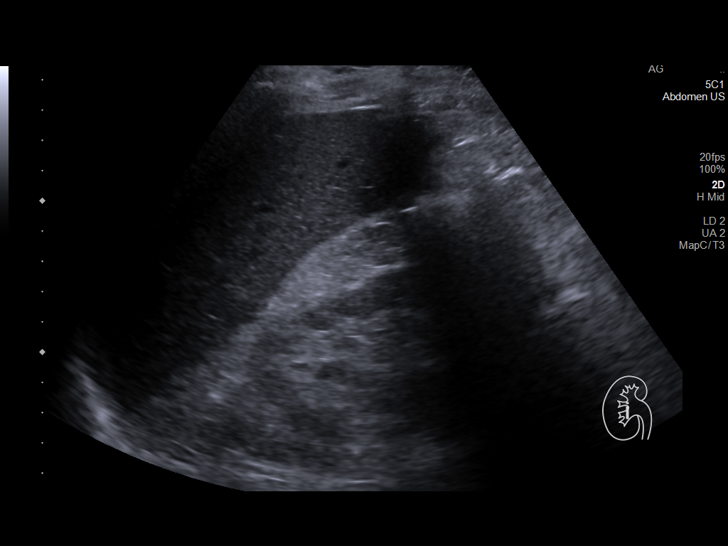

[14 of 25 positions shown; findings below may reference images not displayed]

FINDINGS: Right Kidney:

Renal measurements: 9.2 x 5.4 x 5.8 cm = volume: 152 mL. Lower pole
right renal cyst or minimally complex cyst of maximally 4.0 cm.
Increased renal echogenicity.

Slight improvement in mild right-sided caliectasis with moderate
proximal right hydroureter.

Left Kidney:

Renal measurements: 9.6 x 4.8 x 5.1 cm = volume: 121 mL. Increased
echogenicity. Resolution of hydronephrosis.

Bladder:

Postvoid bladder volume of 642 cc. No bladder abnormality
identified.

Other:

None.
IMPRESSION: Resolved left and improved right-sided hydronephrosis.

Significant postvoid bladder residual.

Increased renal echogenicity, suggesting medical renal disease.

## 2022-12-01 ENCOUNTER — Ambulatory Visit (INDEPENDENT_AMBULATORY_CARE_PROVIDER_SITE_OTHER): Payer: Medicare PPO | Admitting: Physician Assistant

## 2022-12-01 ENCOUNTER — Encounter: Payer: Self-pay | Admitting: Physician Assistant

## 2022-12-01 VITALS — BP 122/82 | HR 68 | Temp 96.8°F | Ht 69.0 in | Wt 179.0 lb

## 2022-12-01 DIAGNOSIS — H6123 Impacted cerumen, bilateral: Secondary | ICD-10-CM

## 2022-12-01 HISTORY — DX: Impacted cerumen, bilateral: H61.23

## 2022-12-01 NOTE — Progress Notes (Signed)
Acute Office Visit  Subjective:    Patient ID: David Baxter, male    DOB: 1941-07-15, 82 y.o.   MRN: [id number]  Chief Complaint  Patient presents with   Ear Fullness    HPI Patient is in today for complaints of both ears feeling full and trouble hearing.  He had ears irrigated in December and has been using ear wax removal drops intermittently.  Has continued to have issues with being stopped up and now for the past week not able to hear again (when he uses hearing aids) Denies fever, cough, congestion  Past Medical History:  Diagnosis Date   Acquired hypothyroidism 04/08/2018   Acute retention of urine 04/09/2018   Acute sinusitis 06/05/2020   AKI (acute kidney injury) ([place]) 04/08/2018   Arthralgia of both lower legs    BMI 23.0-23.9, adult 04/21/2020   BPH (benign prostatic hyperplasia)    BPH with urinary obstruction 12/05/2019   CAD (coronary artery disease)    cardiologist--  dr Bettina Gavia w/ [facility] in [place]   CKD (chronic kidney disease) stage 3, GFR 30-59 ml/min ([place]) 07/06/2015   CKD (chronic kidney disease), stage III ([place])    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 DES OM 2012    2. 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   SIRS (systemic inflammatory response syndrome) ([place]) 04/08/2018   Type 2 diabetes mellitus ([place]) 04/08/2018   UTI (urinary tract infection) 04/08/2018    Past  Surgical History:  Procedure Laterality Date   CORONARY ANGIOPLASTY WITH STENT PLACEMENT  03/04/2015   x2 DES to CFX   CORONARY ANGIOPLASTY WITH STENT PLACEMENT  2012   x1 DES to OM   CYSTOSCOPY WITH INSERTION OF UROLIFT N/A 02/14/2017   Procedure: CYSTOSCOPY WITH INSERTION OF UROLIFT;  Surgeon: Cleon Gustin, MD;  [facility];  Service: Urology;  Laterality: N/A;   EXTRACORPOREAL SHOCK WAVE LITHOTRIPSY  36's   PERCUTANEOUS NEPHROSTOLITHOTOMY  1991   and abdominal hernia repair   URETEROLITHOTOMY  1991 and 1994    Family History  Problem Relation Age of Onset   Heart disease Mother 78       After age 52 - CHF   Hypertension Father    Heart disease Father 42       After age 80 - CHF    Social History   Socioeconomic History   Marital status: Married    Spouse name: Not on file   Number of children: Not on file   Years of education: Not on file   Highest education level: Not on file  Occupational History   Occupation: truck driver  Tobacco Use   Smoking status: Never    Passive exposure: Never   Smokeless tobacco: Never  Vaping Use   Vaping Use: Never used  Substance and Sexual Activity   Alcohol use: No   Drug use: No   Sexual activity: Yes    Partners: Female  Other Topics Concern   Not on file  Social History Narrative   Not on file   Social Determinants of Health   Financial Resource Strain: High Risk (08/18/2022)   Overall Financial Resource Strain (CARDIA)    Difficulty of Paying Living Expenses: Hard  Food Insecurity: No Food Insecurity (06/11/2020)   Hunger Vital Sign    Worried About Running Out of Food in the Last Year: Never true    Ran Out of Food in the Last Year: Never true  Transportation Needs: No Transportation Needs (08/18/2022)   PRAPARE - Hydrologist (Medical): No    Lack of Transportation (Non-Medical): No  Physical Activity: Not on file  Stress: Not on file  Social Connections:  Not on file  Intimate Partner Violence: Not on file     Current Outpatient Medications:    ACCU-CHEK AVIVA PLUS test strip, USE TWICE DAILY AS DIRECTED, Disp: 200 strip, Rfl: 10   Accu-Chek FastClix Lancets MISC, 1 each by Does not apply route in the morning and at bedtime. Use to check blood sugar twice daily as directed., Disp: 100 each, Rfl: 6   Alcohol Swabs (ALCOHOL PREP) 70 % PADS, 1 each by Does not apply route daily., Disp: 100 each, Rfl: 2   allopurinol (ZYLOPRIM) 100 MG tablet, TAKE 1 TABLET EVERY DAY, Disp: 90 tablet, Rfl: 2   aspirin EC 81 MG tablet, Take 81 mg by mouth daily., Disp: , Rfl:    bethanechol (URECHOLINE) 10 MG tablet, Take 1 tablet (10 mg total) by mouth in the morning and at bedtime., Disp: 180 tablet, Rfl: 3   Blood Glucose Calibration (ACCU-CHEK AVIVA) SOLN, 1 each by In Vitro route every 30 (thirty) days., Disp: 1 each, Rfl: 5   Cholecalciferol (VITAMIN D-1000 MAX ST) 25 MCG (1000 UT) tablet, Take 2,000 Units by mouth. , Disp: , Rfl:    COLCRYS 0.6 MG tablet, Prn gout flares, Disp: , Rfl:    finasteride (PROSCAR) 5 MG tablet, Take 1 tablet (5 mg total) by mouth daily., Disp: 90 tablet, Rfl: 3   gabapentin (NEURONTIN) 100 MG capsule, Take 100 mg by mouth 3 (three) times daily., Disp: , Rfl:    levothyroxine (SYNTHROID) 150 MCG tablet, Take 1 tablet (150 mcg total) by mouth daily before breakfast., Disp: 90 tablet, Rfl: 0   LOKELMA 10 g PACK packet, Take 10 g by mouth daily. , Disp: , Rfl:    meclizine (ANTIVERT) 25 MG tablet, Take by mouth. Takes OTC prn dizziness., Disp: , Rfl:    metFORMIN (GLUCOPHAGE) 500 MG tablet, TAKE 1 TABLET TWICE DAILY, Disp: 180 tablet, Rfl: 2   metoprolol tartrate (LOPRESSOR) 25 MG tablet, TAKE 1 TABLET TWICE DAILY, Disp: 180 tablet, Rfl: 2   nitroGLYCERIN (NITROSTAT) 0.4 MG SL tablet, Place 0.4 mg under the tongue every 5 (five) minutes as needed for chest pain., Disp: , Rfl:    simvastatin (ZOCOR) 20 MG tablet, Take 1 tablet (20 mg  total) by mouth daily., Disp: 90 tablet, Rfl: 1   sodium bicarbonate 650 MG tablet, Take by mouth 2 (two) times daily., Disp: , Rfl:    vitamin B-12 (CYANOCOBALAMIN) 1000 MCG tablet, Take 1,000 mcg by mouth daily., Disp: , Rfl:    Allergies  Allergen Reactions   Tamsulosin Other (See Comments)   Aspirin Other (See  Comments)    Upset stomach    CONSTITUTIONAL: Negative for chills, fatigue, fever, E/N/T: Negative for ear pain, nasal congestion and sore throat.  Ears - see HPI CARDIOVASCULAR: Negative for chest pain,  RESPIRATORY: Negative for recent cough and dyspnea.      Objective:    PHYSICAL EXAM:   VS: BP 122/82 (BP Location: Left Arm, Patient Position: Sitting, Cuff Size: Large)   Pulse 68   Temp (!) 96.8 F (36 C) (Temporal)   Ht 5' 9"$  (1.753 m)   Wt 179 lb (81.2 kg)   SpO2 100%   BMI 26.43 kg/m   GEN: Well nourished, well developed, in no acute distress  HEENT: normal external ears and nose - both Tms obscured by cerumen - Lips, Teeth and Gums - normal  Ear irrigation done with no resolution  Cardiac: RRR; no murmurs,  Respiratory:  normal respiratory rate and pattern with no distress - normal breath sounds with no rales, rhonchi, wheezes or rubs   Wt Readings from Last 3 Encounters:  12/01/22 179 lb (81.2 kg)  11/02/22 173 lb (78.5 kg)  09/29/22 173 lb (78.5 kg)    Health Maintenance Due  Topic Date Due   DTaP/Tdap/Td (1 - Tdap) Never done   Zoster Vaccines- Shingrix (1 of 2) Never done   Pneumonia Vaccine 73+ Years old (1 of 1 - PCV) Never done   COVID-19 Vaccine (5 - 2023-24 season) 06/18/2022    There are no preventive care reminders to display for this patient.        Assessment & Plan:   Problem List Items Addressed This Visit       Nervous and Auditory   Bilateral impacted cerumen - Primary   Relevant Orders   Ambulatory referral to ENT     No orders of the defined types were placed in this encounter.    SARA R Laylanie Kruczek, PA-C

## 2022-12-30 ENCOUNTER — Other Ambulatory Visit: Payer: Self-pay

## 2022-12-30 MED ORDER — LEVOTHYROXINE SODIUM 150 MCG PO TABS: 150.0000 ug | ORAL_TABLET | Freq: Every day | ORAL | 0 refills | Status: DC

## 2023-01-24 ENCOUNTER — Other Ambulatory Visit: Payer: Self-pay

## 2023-01-24 MED ORDER — GABAPENTIN 100 MG PO CAPS: 100.0000 mg | ORAL_CAPSULE | Freq: Three times a day (TID) | ORAL | 0 refills | Status: DC

## 2023-02-16 ENCOUNTER — Ambulatory Visit (HOSPITAL_COMMUNITY)
Admission: RE | Admit: 2023-02-16 | Discharge: 2023-02-16 | Disposition: A | Discharge status: Home / Self Care | Payer: Medicare PPO | Source: Ambulatory Visit | Attending: Urology | Admitting: Urology

## 2023-02-16 DIAGNOSIS — N1330 Unspecified hydronephrosis: Secondary | ICD-10-CM | POA: Diagnosis present

## 2023-02-21 ENCOUNTER — Ambulatory Visit (INDEPENDENT_AMBULATORY_CARE_PROVIDER_SITE_OTHER): Payer: Medicare PPO | Admitting: Physician Assistant

## 2023-02-21 ENCOUNTER — Encounter: Payer: Self-pay | Admitting: Physician Assistant

## 2023-02-21 VITALS — BP 136/82 | HR 62 | Temp 97.9°F | Ht 69.0 in | Wt 172.2 lb

## 2023-02-21 DIAGNOSIS — I25708 Atherosclerosis of coronary artery bypass graft(s), unspecified, with other forms of angina pectoris: Secondary | ICD-10-CM

## 2023-02-21 DIAGNOSIS — M545 Low back pain, unspecified: Secondary | ICD-10-CM

## 2023-02-21 DIAGNOSIS — E782 Mixed hyperlipidemia: Secondary | ICD-10-CM

## 2023-02-21 DIAGNOSIS — I10 Essential (primary) hypertension: Secondary | ICD-10-CM | POA: Diagnosis not present

## 2023-02-21 DIAGNOSIS — H9193 Unspecified hearing loss, bilateral: Secondary | ICD-10-CM

## 2023-02-21 DIAGNOSIS — N1832 Chronic kidney disease, stage 3b: Secondary | ICD-10-CM

## 2023-02-21 DIAGNOSIS — E1122 Type 2 diabetes mellitus with diabetic chronic kidney disease: Secondary | ICD-10-CM

## 2023-02-21 DIAGNOSIS — Z8739 Personal history of other diseases of the musculoskeletal system and connective tissue: Secondary | ICD-10-CM

## 2023-02-21 DIAGNOSIS — Z23 Encounter for immunization: Secondary | ICD-10-CM

## 2023-02-21 DIAGNOSIS — E039 Hypothyroidism, unspecified: Secondary | ICD-10-CM | POA: Diagnosis not present

## 2023-02-21 DIAGNOSIS — N183 Chronic kidney disease, stage 3 unspecified: Secondary | ICD-10-CM

## 2023-02-21 DIAGNOSIS — N401 Enlarged prostate with lower urinary tract symptoms: Secondary | ICD-10-CM

## 2023-02-21 HISTORY — DX: Chronic kidney disease, stage 3 unspecified: N18.30

## 2023-02-21 HISTORY — DX: Chronic kidney disease, stage 3 unspecified: E11.22

## 2023-02-21 HISTORY — DX: Encounter for immunization: Z23

## 2023-02-21 MED ORDER — TIZANIDINE HCL 4 MG PO TABS
4.0000 mg | ORAL_TABLET | Freq: Every day | ORAL | 0 refills | Status: DC
Start: 2023-02-21 — End: 2023-03-25

## 2023-02-21 NOTE — Progress Notes (Signed)
Subjective:  Patient ID: David Baxter, male    DOB: [birth year]  Age: 82 y.o. MRN: [id number]  Chief Complaint  Patient presents with   Medical Management of Chronic Issues    HPI   Patient present with type 2 diabetes controlled without use of insuline.   Compliance with treatment has been good; patient take medicines as directed, maintains diet and exercise regimen, follows up as directed, and is keeping glucose diary. His fasting glucose ranges 90s-110. Current medicines for diabetes Metformin 500 mg twice a day. Patient performs foot exams daily and last ophthalmologic exam was 2/24. Last A1C was 6.0 %  Patient presents with hyperlipidemia.  Compliance with treatment has been good; patient takes medicines as directed, maintains low cholesterol diet, follows up as directed, and maintains exercise regimen.  Patient is using Simvastatin 20 mg daily without problems.   Patient presents for follow up of hypertension.  Patient tolerating Metoprolol 25 mg twice a day, Aspirin 81 mg daily well without side effects. Patient is working on maintaining diet and exercise regimen and follows up as directed.  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 had 5 prior stents) - follows with Dr Dulce Sellar (cardiology) regularly  Hypothyroidism: Patient is taking Levothyroxine 150 mcg daily.  GOUT: Taking Allopurinol 100 mg daily, Colcrys 0.6 mg PRN for gout flares, bethanechol 10 mg in the morning.  Pt with history of BPH - currently on Proscar 5mg  qd and follows with urologist Dr Ronne Binning regularly Pt also with history of chronic kidney diseases stage 3b - does follow with nephrologist "Dr A" in [facility] regularly - currently takes lokelma 10g pack and sodium bicarb 650mg   Pt does wear hearing aids with history of hearing loss  Pt complains of low back pain.  About 4-6 weeks ago he was helping his son lift a pump out of the well and felt pain in lower back - states hurts with long periods of sitting and certain  movements.  Denies radiation of pain to legs Current Outpatient Medications on File Prior to Visit  Medication Sig Dispense Refill   ACCU-CHEK AVIVA PLUS test strip USE TWICE DAILY AS DIRECTED 200 strip 10   Accu-Chek FastClix Lancets MISC 1 each by Does not apply route in the morning and at bedtime. Use to check blood sugar twice daily as directed. 100 each 6   Alcohol Swabs (ALCOHOL PREP) 70 % PADS 1 each by Does not apply route daily. 100 each 2   allopurinol (ZYLOPRIM) 100 MG tablet TAKE 1 TABLET EVERY DAY 90 tablet 2   aspirin EC 81 MG tablet Take 81 mg by mouth daily.     bethanechol (URECHOLINE) 10 MG tablet Take 1 tablet (10 mg total) by mouth in the morning and at bedtime. 180 tablet 3   Blood Glucose Calibration (ACCU-CHEK AVIVA) SOLN 1 each by In Vitro route every 30 (thirty) days. 1 each 5   Cholecalciferol (VITAMIN D-1000 MAX ST) 25 MCG (1000 UT) tablet Take 2,000 Units by mouth.      COLCRYS 0.6 MG tablet Prn gout flares     finasteride (PROSCAR) 5 MG tablet Take 1 tablet (5 mg total) by mouth daily. 90 tablet 3   gabapentin (NEURONTIN) 100 MG capsule Take 1 capsule (100 mg total) by mouth 3 (three) times daily. (Patient taking differently: Take 100 mg by mouth 2 (two) times daily.) 270 capsule 0   levothyroxine (SYNTHROID) 150 MCG tablet Take 1 tablet (150 mcg total) by mouth daily before  breakfast. 90 tablet 0   LOKELMA 10 g PACK packet Take 10 g by mouth daily.      meclizine (ANTIVERT) 25 MG tablet Take by mouth. Takes OTC prn dizziness.     metFORMIN (GLUCOPHAGE) 500 MG tablet TAKE 1 TABLET TWICE DAILY 180 tablet 2   metoprolol tartrate (LOPRESSOR) 25 MG tablet TAKE 1 TABLET TWICE DAILY 180 tablet 2   nitroGLYCERIN (NITROSTAT) 0.4 MG SL tablet Place 0.4 mg under the tongue every 5 (five) minutes as needed for chest pain.     simvastatin (ZOCOR) 20 MG tablet Take 1 tablet (20 mg total) by mouth daily. 90 tablet 1   sodium bicarbonate 650 MG tablet Take by mouth 2 (two) times  daily.     vitamin B-12 (CYANOCOBALAMIN) 1000 MCG tablet Take 1,000 mcg by mouth daily.     No current facility-administered medications on file prior to visit.   Past Medical History:  Diagnosis Date   Acquired hypothyroidism 04/08/2018   Acute retention of urine 04/09/2018   Acute sinusitis 06/05/2020   AKI (acute kidney injury) (HCC) 04/08/2018   Arthralgia of both lower legs    BMI 23.0-23.9, adult 04/21/2020   BPH (benign prostatic hyperplasia)    BPH with urinary obstruction 12/05/2019   CAD (coronary artery disease)    cardiologist--  dr Dulce Sellar w/ [facility] in [place]   CKD (chronic kidney disease) stage 3, GFR 30-59 ml/min (HCC) 07/06/2015   CKD (chronic kidney disease), stage III (HCC)    Coronary artery disease involving coronary bypass graft of native heart 04/09/2015   Overview:  1. DES OM 2012    2. 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   SIRS (systemic inflammatory response syndrome) (HCC) 04/08/2018   Type 2 diabetes mellitus (HCC) 04/08/2018   UTI (urinary tract infection) 04/08/2018   Past Surgical History:  Procedure Laterality Date   CORONARY ANGIOPLASTY WITH STENT PLACEMENT  03/04/2015   x2 DES to CFX   CORONARY ANGIOPLASTY WITH STENT PLACEMENT  2012   x1 DES to OM   CYSTOSCOPY WITH INSERTION OF UROLIFT N/A 02/14/2017   Procedure: CYSTOSCOPY WITH INSERTION OF UROLIFT;  Surgeon: Malen Gauze, MD;  Location: [facility];   Service: Urology;  Laterality: N/A;   EXTRACORPOREAL SHOCK WAVE LITHOTRIPSY  1990's   PERCUTANEOUS NEPHROSTOLITHOTOMY  1991   and abdominal hernia repair   URETEROLITHOTOMY  1991 and 1994    Family History  Problem Relation Age of Onset   Heart disease Mother 81       After age 25 - CHF   Hypertension Father    Heart disease Father 75       After age 60 - CHF   Social History   Socioeconomic History   Marital status: Married    Spouse name: Not on file   Number of children: Not on file  Years of education: Not on file   Highest education level: Not on file  Occupational History   Occupation: truck driver  Tobacco Use   Smoking status: Never    Passive exposure: Never   Smokeless tobacco: Never  Vaping Use   Vaping Use: Never used  Substance and Sexual Activity   Alcohol use: No   Drug use: No   Sexual activity: Yes    Partners: Female  Other Topics Concern   Not on file  Social History Narrative   Not on file   Social Determinants of Health   Financial Resource Strain: High Risk (08/18/2022)   Overall Financial Resource Strain (CARDIA)    Difficulty of Paying Living Expenses: Hard  Food Insecurity: No Food Insecurity (06/11/2020)   Hunger Vital Sign    Worried About Running Out of Food in the Last Year: Never true    Ran Out of Food in the Last Year: Never true  Transportation Needs: No Transportation Needs (08/18/2022)   PRAPARE - Administrator, Civil Service (Medical): No    Lack of Transportation (Non-Medical): No  Physical Activity: Not on file  Stress: Not on file  Social Connections: Not on file    CONSTITUTIONAL: Negative for chills, fatigue, fever, unintentional weight gain and unintentional weight loss.  E/N/T: Negative for ear pain, nasal congestion and sore throat.  CARDIOVASCULAR: Negative for chest pain, dizziness, palpitations and pedal edema.  RESPIRATORY: Negative for recent cough and dyspnea.  GASTROINTESTINAL: Negative for  abdominal pain, acid reflux symptoms, constipation, diarrhea, nausea and vomiting.  MSK: see HPI INTEGUMENTARY: Negative for rash.  NEUROLOGICAL: Negative for dizziness and headaches.  PSYCHIATRIC: Negative for sleep disturbance and to question depression screen.  Negative for depression, negative for anhedonia.       Objective:  PHYSICAL EXAM:   VS: BP 136/82 (BP Location: Left Arm, Patient Position: Sitting, Cuff Size: Normal)   Pulse 62   Temp 97.9 F (36.6 C) (Temporal)   Ht 5\' 9"  (1.753 m)   Wt 172 lb 3.2 oz (78.1 kg)   SpO2 98%   BMI 25.43 kg/m   GEN: Well nourished, well developed, in no acute distress  Cardiac: RRR; no murmurs, rubs, or gallops,no edema - Respiratory:  normal respiratory rate and pattern with no distress - normal breath sounds with no rales, rhonchi, wheezes or rubs MS: no deformity or atrophy - tender to palpation of lower back Skin: warm and dry, no rash  Neuro:  Alert and Oriented x 3,  Psych: euthymic mood, appropriate affect and demeanor  Lab Results  Component Value Date   WBC 9.8 09/29/2022   HGB 14.4 09/29/2022   HCT 43.9 09/29/2022   PLT 336 09/29/2022   GLUCOSE 90 09/29/2022   CHOL 145 09/29/2022   TRIG 83 09/29/2022   HDL 50 09/29/2022   LDLCALC 79 09/29/2022   ALT 9 09/29/2022   AST 17 09/29/2022   NA 143 09/29/2022   K 4.9 09/29/2022   CL 102 09/29/2022   CREATININE 1.50 (H) 09/29/2022   BUN 20 09/29/2022   CO2 27 09/29/2022   TSH 3.710 11/25/2022   HGBA1C 6.0 (H) 09/29/2022   MICROALBUR 80 12/23/2020      Assessment & Plan:   Problem List Items Addressed This Visit       Cardiovascular and Mediastinum   Coronary artery disease of bypass graft of native heart with stable angina pectoris (HCC) Continue current meds Follow up with cardiology as directed  Patient has not had recent angina or nitroglycerin use.     Essential hypertension Continue current meds     Endocrine   Type 2 diabetes mellitus with other  specified complication (HCC) - Primary Conitnue meds as directed Watch diet Labwork pending Urine microalbumin sent to [facility]     Other   Hyperlipidemia Continue med  Watch diet  History of gout Uric acid level pending Continue meds  Chronic kidney disease stage 3b Continue meds and follow up with nephrology  Low back pain Alternate heat, ice Rx tizanadine  Hypothyroidism TSH pending Continue levothyroxine  BPH Continue meds and follow up with urologist as scheduled   Other Visit Diagnoses     Need for immunization against pneumonia   Relevant Orders   Prevnar 20 given     .    Orders Placed This Encounter  Procedures   Pneumococcal conjugate vaccine 20-valent (Prevnar-20)   CBC with Differential/Platelet   Comprehensive metabolic panel   TSH   Lipid panel   Hemoglobin A1c   Microalbumin/Creatinine Ratio, Urine   Uric Acid     Follow-up: Return in about 6 months (around 08/24/2023) for chronic fasting follow-up.  An After Visit Summary was printed and given to the patient.  Jettie Pagan [facility] [phone]

## 2023-02-23 ENCOUNTER — Ambulatory Visit: Payer: Medicare PPO | Admitting: Urology

## 2023-02-23 ENCOUNTER — Encounter: Payer: Self-pay | Admitting: Urology

## 2023-02-23 VITALS — BP 161/70 | HR 65

## 2023-02-23 DIAGNOSIS — N1330 Unspecified hydronephrosis: Secondary | ICD-10-CM

## 2023-02-23 DIAGNOSIS — N401 Enlarged prostate with lower urinary tract symptoms: Secondary | ICD-10-CM

## 2023-02-23 DIAGNOSIS — R339 Retention of urine, unspecified: Secondary | ICD-10-CM

## 2023-02-23 LAB — URINALYSIS, ROUTINE W REFLEX MICROSCOPIC
Bilirubin, UA: NEGATIVE
Glucose, UA: NEGATIVE
Ketones, UA: NEGATIVE
Nitrite, UA: NEGATIVE
Protein,UA: NEGATIVE
RBC, UA: NEGATIVE
Specific Gravity, UA: 1.015 (ref 1.005–1.030)
Urobilinogen, Ur: 0.2 mg/dL (ref 0.2–1.0)
pH, UA: 7 (ref 5.0–7.5)

## 2023-02-23 LAB — MICROSCOPIC EXAMINATION: Bacteria, UA: NONE SEEN

## 2023-02-23 LAB — BLADDER SCAN AMB NON-IMAGING: Scan Result: 726

## 2023-02-23 MED ORDER — BETHANECHOL CHLORIDE 10 MG PO TABS: 10.0000 mg | ORAL_TABLET | Freq: Two times a day (BID) | ORAL | 3 refills | Status: DC

## 2023-02-23 MED ORDER — FINASTERIDE 5 MG PO TABS: 5.0000 mg | ORAL_TABLET | Freq: Every day | ORAL | 3 refills | Status: DC

## 2023-02-23 NOTE — Patient Instructions (Signed)
Hydronephrosis  Hydronephrosis is the swelling of one or both kidneys due to a blockage that stops urine from flowing out of the body. Kidneys filter waste from the blood and produce urine. This condition can lead to kidney failure and may become life-threatening if not treated promptly. What are the causes? In infants and children, common causes include problems that occur when a baby is developing in the womb. These can include problems in the kidneys or in the tubes that drain urine into the bladder (ureters). In adults, common causes include: Kidney stones. Pregnancy. A tumor or cyst in the abdomen or pelvis. An enlarged prostate gland. Other causes include: Bladder infection. Scar tissue from a previous surgery or injury. A blood clot. Cancer of the prostate, bladder, uterus, ovary, or colon. What are the signs or symptoms? Symptoms of this condition include: Pain or discomfort in your side (flank) or abdomen. Swelling in your abdomen. Nausea and vomiting. Fever. Pain when passing urine. Feelings of urgency when you need to urinate. Urinating more often than normal. In some cases, you may not have any symptoms. How is this diagnosed? This condition may be diagnosed based on: Your symptoms and medical history. A physical exam. Blood and urine tests. Imaging tests, such as an ultrasound, CT scan, or MRI. A procedure to look at your urinary tract and bladder by inserting a scope into the urethra (cystoscopy). How is this treated? Treatment for this condition depends on where the blockage is, how long it has been there, and what caused it. The goal of treatment is to remove the blockage. Treatment may include: Antibiotic medicines to treat or prevent infection. A procedure to place a small, thin tube (stent) into a blocked ureter. The stent will keep the ureter open so that urine can drain through it. A nonsurgical procedure that crushes kidney stones with shock waves  (extracorporeal shock wave lithotripsy). If kidney failure occurs, treatment may include dialysis or a kidney transplant. Follow these instructions at home:  Take over-the-counter and prescription medicines only as told by your health care provider. If you were prescribed an antibiotic medicine, take it exactly as told by your health care provider. Do not stop taking the antibiotic even if you start to feel better. Rest and return to your normal activities as told by your health care provider. Ask your health care provider what activities are safe for you. Drink enough fluid to keep your urine pale yellow. Keep all follow-up visits. This is important. Contact a health care provider if: You continue to have symptoms after treatment. You develop new symptoms. Your urine becomes cloudy or bloody. You have a fever. Get help right away if: You have severe flank or abdominal pain. You cannot drink fluids without vomiting. Summary Hydronephrosis is the swelling of one or both kidneys due to a blockage that stops urine from flowing out of the body. Hydronephrosis can lead to kidney failure and may become life-threatening if not treated promptly. The goal of treatment is to remove the blockage. It may include a procedure to insert a stent into a blocked ureter, a procedure to break up kidney stones, or taking antibiotic medicines. Follow your health care provider's instructions for taking care of yourself at home, including instructions about drinking fluids, taking medicines, and limiting activities. This information is not intended to replace advice given to you by your health care provider. Make sure you discuss any questions you have with your health care provider. Document Revised: 01/22/2020 Document Reviewed: 01/22/2020 Elsevier   Patient Education  2023 [employer]  

## 2023-02-23 NOTE — Progress Notes (Signed)
post void residual=[id number]

## 2023-02-23 NOTE — Progress Notes (Signed)
02/23/2023 11:47 AM   David Baxter 1941-10-13 [id number]  Referring provider: Abigail Miyamoto, MD No address on file  No chief complaint on file.   HPI: David Baxter is a 82yo here for followup for hydronephrosis and BPH with urinary retention. Renal US shows no hydronephrosis. PVR 726cc. He is on urocholine 10mg  BID. IPSS 16 QOl 2 on finasteride   PMH: Past Medical History:  Diagnosis Date   Acquired hypothyroidism 04/08/2018   Acute retention of urine 04/09/2018   Acute sinusitis 06/05/2020   AKI (acute kidney injury) (HCC) 04/08/2018   Arthralgia of both lower legs    BMI 23.0-23.9, adult 04/21/2020   BPH (benign prostatic hyperplasia)    BPH with urinary obstruction 12/05/2019   CAD (coronary artery disease)    cardiologist--  dr Dulce Sellar w/ [facility] in [place]   CKD (chronic kidney disease) stage 3, GFR 30-59 ml/min (HCC) 07/06/2015   CKD (chronic kidney disease), stage III (HCC)    Coronary artery disease involving coronary bypass graft of native heart 04/09/2015   Overview:  1. DES OM 2012    2. 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   SIRS (systemic inflammatory response syndrome) (HCC) 04/08/2018   Type 2 diabetes mellitus (HCC) 04/08/2018   UTI (urinary tract infection) 04/08/2018    Surgical History: Past Surgical History:  Procedure Laterality Date   CORONARY ANGIOPLASTY WITH STENT PLACEMENT  03/04/2015   x2 DES to CFX   CORONARY  ANGIOPLASTY WITH STENT PLACEMENT  2012   x1 DES to OM   CYSTOSCOPY WITH INSERTION OF UROLIFT N/A 02/14/2017   Procedure: CYSTOSCOPY WITH INSERTION OF UROLIFT;  Surgeon: Malen Gauze, MD;  Location: [facility];  Service: Urology;  Laterality: N/A;   EXTRACORPOREAL SHOCK WAVE LITHOTRIPSY  1990's   PERCUTANEOUS NEPHROSTOLITHOTOMY  1991   and abdominal hernia repair   URETEROLITHOTOMY  1991 and 1994    Home Medications:  Allergies as of 02/23/2023       Reactions   Tamsulosin Other (See Comments)   Aspirin Other (See Comments)   Upset stomach        Medication List        Accurate as of Feb 23, 2023 11:47 AM. If you have any questions, ask your nurse or doctor.          Accu-Chek Aviva Plus test strip Generic drug: glucose blood USE TWICE DAILY AS DIRECTED   Accu-Chek Aviva Soln 1 each by In Vitro route every 30 (thirty) days.   Accu-Chek FastClix Lancets Misc 1 each by Does not apply route in the morning and at bedtime. Use to check blood sugar twice daily as directed.   Alcohol Prep 70 % Pads 1 each by Does not apply route daily.   allopurinol 100 MG tablet Commonly known as: ZYLOPRIM TAKE 1 TABLET EVERY DAY  aspirin EC 81 MG tablet Take 81 mg by mouth daily.   bethanechol 10 MG tablet Commonly known as: URECHOLINE Take 1 tablet (10 mg total) by mouth in the morning and at bedtime.   Colcrys 0.6 MG tablet Generic drug: colchicine Prn gout flares   cyanocobalamin 1000 MCG tablet Commonly known as: VITAMIN B12 Take 1,000 mcg by mouth daily.   finasteride 5 MG tablet Commonly known as: PROSCAR Take 1 tablet (5 mg total) by mouth daily.   gabapentin 100 MG capsule Commonly known as: NEURONTIN Take 1 capsule (100 mg total) by mouth 3 (three) times daily. What changed: when to take this   levothyroxine 150 MCG tablet Commonly known as: SYNTHROID Take 1 tablet (150 mcg total) by mouth daily before breakfast.   Lokelma 10 g Pack  packet Generic drug: sodium zirconium cyclosilicate Take 10 g by mouth daily.   meclizine 25 MG tablet Commonly known as: ANTIVERT Take by mouth. Takes OTC prn dizziness.   metFORMIN 500 MG tablet Commonly known as: GLUCOPHAGE TAKE 1 TABLET TWICE DAILY   metoprolol tartrate 25 MG tablet Commonly known as: LOPRESSOR TAKE 1 TABLET TWICE DAILY   nitroGLYCERIN 0.4 MG SL tablet Commonly known as: NITROSTAT Place 0.4 mg under the tongue every 5 (five) minutes as needed for chest pain.   simvastatin 20 MG tablet Commonly known as: ZOCOR Take 1 tablet (20 mg total) by mouth daily.   sodium bicarbonate 650 MG tablet Take by mouth 2 (two) times daily.   tiZANidine 4 MG tablet Commonly known as: Zanaflex Take 1 tablet (4 mg total) by mouth at bedtime.   Vitamin D-1000 Max St 25 MCG (1000 UT) tablet Generic drug: Cholecalciferol Take 2,000 Units by mouth.        Allergies:  Allergies  Allergen Reactions   Tamsulosin Other (See Comments)   Aspirin Other (See Comments)    Upset stomach    Family History: Family History  Problem Relation Age of Onset   Heart disease Mother 24       After age 71 - CHF   Hypertension Father    Heart disease Father 44       After age 18 - CHF    Social History:  reports that he has never smoked. He has never been exposed to tobacco smoke. He has never used smokeless tobacco. He reports that he does not drink alcohol and does not use drugs.  ROS: All other review of systems were reviewed and are negative except what is noted above in HPI  Physical Exam: BP (!) 161/70   Pulse 65   Constitutional:  Alert and oriented, No acute distress. HEENT: [place] AT, moist mucus membranes.  Trachea midline, no masses. Cardiovascular: No clubbing, cyanosis, or edema. Respiratory: Normal respiratory effort, no increased work of breathing. GI: Abdomen is soft, nontender, nondistended, no abdominal masses GU: No CVA tenderness.  Lymph: No cervical or  inguinal lymphadenopathy. Skin: No rashes, bruises or suspicious lesions. Neurologic: Grossly intact, no focal deficits, moving all 4 extremities. Psychiatric: Normal mood and affect.  Laboratory Data: Lab Results  Component Value Date   WBC 9.8 09/29/2022   HGB 14.4 09/29/2022   HCT 43.9 09/29/2022   MCV 89 09/29/2022   PLT 336 09/29/2022    Lab Results  Component Value Date   CREATININE 1.50 (H) 09/29/2022    No results found for: "PSA"  No results found for: "TESTOSTERONE"  Lab Results  Component Value Date   HGBA1C 6.0 (  H) 09/29/2022    Urinalysis    Component Value Date/Time   APPEARANCEUR Clear 08/24/2022 1133   GLUCOSEU Negative 08/24/2022 1133   BILIRUBINUR Negative 08/24/2022 1133   PROTEINUR Negative 08/24/2022 1133   NITRITE Negative 08/24/2022 1133   LEUKOCYTESUR Trace (A) 08/24/2022 1133    Lab Results  Component Value Date   LABMICR See below: 08/24/2022   WBCUA 6-10 (A) 08/24/2022   LABEPIT 0-10 08/24/2022   BACTERIA None seen 08/24/2022    Pertinent Imaging:  No results found for this or any previous visit.  No results found for this or any previous visit.  No results found for this or any previous visit.  No results found for this or any previous visit.  Results for orders placed during the hospital encounter of 02/16/23  Ultrasound renal complete  Narrative CLINICAL DATA:  Follow-up possible hydronephrosis.  EXAM: RENAL / URINARY TRACT ULTRASOUND COMPLETE  COMPARISON:  08/17/2022  FINDINGS: Right Kidney:  Renal measurements: 9.4 x 5.7 x 5.1 cm = volume: 144 ML. Cortical thickness and echogenicity is within normal. No definite hydronephrosis. 4.1 cm cyst over the medial mid pole and 1.4 cm cyst over the upper pole.  Left Kidney:  Renal measurements: 9.4 x 4.7 x 4.5 cm = volume: 105 mL. Cortical thickness and echogenicity is within normal.  Bladder:  Appears normal for degree of bladder distention. Bilateral  ureteral jets visualized. Prevoid volume 800 mL and postvoid volume 513 mL.  Other:  Evidence of cholelithiasis with 1.1 cm gallstone seen.  IMPRESSION: 1. Normal size kidneys without hydronephrosis. Two right renal cysts. 2. Significant postvoid residual. 3. Cholelithiasis.   Electronically Signed By: Elberta Fortis M.D. On: 02/20/2023 11:13  No valid procedures specified. No results found for this or any previous visit.  No results found for this or any previous visit.   Assessment & Plan:    1. Hydronephrosis, unspecified hydronephrosis type -followup 6 months with renal US - Urinalysis, Routine w reflex microscopic - BLADDER SCAN AMB NON-IMAGING  2. Retention, urine -continue bethanecol 10mg  BID  3. Benign localized prostatic hyperplasia with lower urinary tract symptoms (LUTS) -continue finasteride 5mg     No follow-ups on file.  Wilkie Aye, MD  [facility] [place]

## 2023-02-28 LAB — CBC WITH DIFFERENTIAL/PLATELET
Basophils Absolute: 0.1 10*3/uL (ref 0.0–0.2)
Basos: 1 %
EOS (ABSOLUTE): 0.5 10*3/uL — ABNORMAL HIGH (ref 0.0–0.4)
Eos: 5 %
Hematocrit: 45.4 % (ref 37.5–51.0)
Hemoglobin: 14.4 g/dL (ref 13.0–17.7)
Immature Grans (Abs): 0 10*3/uL (ref 0.0–0.1)
Immature Granulocytes: 0 %
Lymphocytes Absolute: 2.7 10*3/uL (ref 0.7–3.1)
Lymphs: 29 %
MCH: 28.9 pg (ref 26.6–33.0)
MCHC: 31.7 g/dL (ref 31.5–35.7)
MCV: 91 fL (ref 79–97)
Monocytes Absolute: 0.8 10*3/uL (ref 0.1–0.9)
Monocytes: 9 %
Neutrophils Absolute: 5.2 10*3/uL (ref 1.4–7.0)
Neutrophils: 56 %
Platelets: 373 10*3/uL (ref 150–450)
RBC: 4.99 x10E6/uL (ref 4.14–5.80)
RDW: 14.2 % (ref 11.6–15.4)
WBC: 9.2 10*3/uL (ref 3.4–10.8)

## 2023-02-28 LAB — MICROALBUMIN / CREATININE URINE RATIO

## 2023-02-28 LAB — URIC ACID: Uric Acid: 6.2 mg/dL (ref 3.8–8.4)

## 2023-02-28 LAB — LIPID PANEL
Chol/HDL Ratio: 2.5 ratio (ref 0.0–5.0)
Cholesterol, Total: 130 mg/dL (ref 100–199)
HDL: 51 mg/dL (ref >39)
LDL Chol Calc (NIH): 65 mg/dL (ref 0–99)
Triglycerides: 65 mg/dL (ref 0–149)
VLDL Cholesterol Cal: 14 mg/dL (ref 5–40)

## 2023-02-28 LAB — COMPREHENSIVE METABOLIC PANEL
ALT: 10 IU/L (ref 0–44)
AST: 15 IU/L (ref 0–40)
Albumin/Globulin Ratio: 1.8 (ref 1.2–2.2)
Albumin: 4.3 g/dL (ref 3.7–4.7)
Alkaline Phosphatase: 103 IU/L (ref 44–121)
BUN/Creatinine Ratio: 13 (ref 10–24)
BUN: 19 mg/dL (ref 8–27)
Bilirubin Total: 0.4 mg/dL (ref 0.0–1.2)
CO2: 26 mmol/L (ref 20–29)
Calcium: 9 mg/dL (ref 8.6–10.2)
Chloride: 103 mmol/L (ref 96–106)
Creatinine, Ser: 1.52 mg/dL — ABNORMAL HIGH (ref 0.76–1.27)
Globulin, Total: 2.4 g/dL (ref 1.5–4.5)
Glucose: 100 mg/dL — ABNORMAL HIGH (ref 70–99)
Potassium: 5.2 mmol/L (ref 3.5–5.2)
Sodium: 143 mmol/L (ref 134–144)
Total Protein: 6.7 g/dL (ref 6.0–8.5)
eGFR: 45 mL/min/{1.73_m2} — ABNORMAL LOW (ref >59)

## 2023-02-28 LAB — HEMOGLOBIN A1C
Est. average glucose Bld gHb Est-mCnc: 126 mg/dL
Hgb A1c MFr Bld: 6 % — ABNORMAL HIGH (ref 4.8–5.6)

## 2023-02-28 LAB — CARDIOVASCULAR RISK ASSESSMENT

## 2023-02-28 LAB — TSH: TSH: 3.91 u[IU]/mL (ref 0.450–4.500)

## 2023-03-10 ENCOUNTER — Other Ambulatory Visit: Payer: Self-pay

## 2023-03-10 NOTE — Telephone Encounter (Signed)
This rx needs to be sent to his nephrologist

## 2023-03-15 ENCOUNTER — Other Ambulatory Visit: Payer: Self-pay

## 2023-03-15 ENCOUNTER — Telehealth: Payer: Self-pay

## 2023-03-15 MED ORDER — LOKELMA 10 G PO PACK: 10.0000 g | PACK | Freq: Every day | ORAL | 1 refills | Status: DC

## 2023-03-15 NOTE — Telephone Encounter (Signed)
I talked with nurse from [facility] [phone]. I let them know that patient is receiving patient assistance that we filled out for him. I asked if they can take over because they prescribed to him. Patient verbalized that she will look it out.

## 2023-03-24 ENCOUNTER — Other Ambulatory Visit: Payer: Self-pay | Admitting: Physician Assistant

## 2023-03-24 DIAGNOSIS — M545 Low back pain, unspecified: Secondary | ICD-10-CM

## 2023-03-30 DIAGNOSIS — E039 Hypothyroidism, unspecified: Secondary | ICD-10-CM | POA: Diagnosis not present

## 2023-03-30 DIAGNOSIS — R309 Painful micturition, unspecified: Secondary | ICD-10-CM | POA: Diagnosis not present

## 2023-03-30 DIAGNOSIS — E1169 Type 2 diabetes mellitus with other specified complication: Secondary | ICD-10-CM | POA: Diagnosis not present

## 2023-03-30 DIAGNOSIS — I10 Essential (primary) hypertension: Secondary | ICD-10-CM | POA: Diagnosis not present

## 2023-03-30 DIAGNOSIS — E875 Hyperkalemia: Secondary | ICD-10-CM | POA: Diagnosis not present

## 2023-03-30 DIAGNOSIS — M1000 Idiopathic gout, unspecified site: Secondary | ICD-10-CM | POA: Diagnosis not present

## 2023-03-30 DIAGNOSIS — R809 Proteinuria, unspecified: Secondary | ICD-10-CM | POA: Diagnosis not present

## 2023-03-30 DIAGNOSIS — N139 Obstructive and reflux uropathy, unspecified: Secondary | ICD-10-CM | POA: Diagnosis not present

## 2023-03-30 DIAGNOSIS — N183 Chronic kidney disease, stage 3 unspecified: Secondary | ICD-10-CM | POA: Diagnosis not present

## 2023-03-31 ENCOUNTER — Other Ambulatory Visit: Payer: Self-pay | Admitting: Family Medicine

## 2023-04-06 ENCOUNTER — Telehealth: Payer: Medicare PPO

## 2023-04-18 ENCOUNTER — Other Ambulatory Visit: Payer: Self-pay

## 2023-04-18 DIAGNOSIS — Z8739 Personal history of other diseases of the musculoskeletal system and connective tissue: Secondary | ICD-10-CM

## 2023-04-18 MED ORDER — SIMVASTATIN 20 MG PO TABS: 20.0000 mg | ORAL_TABLET | Freq: Every day | ORAL | 1 refills | Status: DC

## 2023-04-18 MED ORDER — ALLOPURINOL 100 MG PO TABS: 100.0000 mg | ORAL_TABLET | Freq: Every day | ORAL | 1 refills | Status: DC

## 2023-05-16 ENCOUNTER — Other Ambulatory Visit: Payer: Self-pay | Admitting: Physician Assistant

## 2023-05-16 DIAGNOSIS — M545 Low back pain, unspecified: Secondary | ICD-10-CM

## 2023-05-16 MED ORDER — TIZANIDINE HCL 4 MG PO TABS
4.0000 mg | ORAL_TABLET | Freq: Every day | ORAL | 2 refills | Status: DC
Start: 2023-05-16 — End: 2023-08-29

## 2023-06-29 ENCOUNTER — Other Ambulatory Visit: Payer: Self-pay | Admitting: Physician Assistant

## 2023-07-11 ENCOUNTER — Other Ambulatory Visit: Payer: Self-pay | Admitting: Physician Assistant

## 2023-07-12 ENCOUNTER — Other Ambulatory Visit: Payer: Self-pay

## 2023-07-12 DIAGNOSIS — I10 Essential (primary) hypertension: Secondary | ICD-10-CM

## 2023-07-12 MED ORDER — METOPROLOL TARTRATE 25 MG PO TABS: 25.0000 mg | ORAL_TABLET | Freq: Two times a day (BID) | ORAL | 0 refills | Status: DC

## 2023-07-14 ENCOUNTER — Other Ambulatory Visit: Payer: Self-pay

## 2023-07-14 DIAGNOSIS — E1121 Type 2 diabetes mellitus with diabetic nephropathy: Secondary | ICD-10-CM

## 2023-07-14 MED ORDER — METFORMIN HCL 500 MG PO TABS: 500.0000 mg | ORAL_TABLET | Freq: Two times a day (BID) | ORAL | 0 refills | Status: DC

## 2023-07-18 ENCOUNTER — Other Ambulatory Visit: Payer: Self-pay

## 2023-07-18 DIAGNOSIS — E1121 Type 2 diabetes mellitus with diabetic nephropathy: Secondary | ICD-10-CM

## 2023-07-18 MED ORDER — METFORMIN HCL 500 MG PO TABS
500.0000 mg | ORAL_TABLET | Freq: Two times a day (BID) | ORAL | 0 refills | Status: DC
Start: 2023-07-18 — End: 2023-09-26

## 2023-07-19 ENCOUNTER — Other Ambulatory Visit: Payer: Self-pay

## 2023-07-19 MED ORDER — LOKELMA 10 G PO PACK: 10.0000 g | PACK | Freq: Every day | ORAL | 1 refills | Status: AC

## 2023-08-15 NOTE — Progress Notes (Unsigned)
Cardiology Office Note:    Date:  08/16/2023   ID:  David Baxter, DOB 1941/01/03, MRN [id number]  PCP:  Marianne Sofia, PA-C  Cardiologist:  Norman Herrlich, MD    Referring MD: Marianne Sofia, PA-C    ASSESSMENT:    1. CAD in native artery   2. RBBB (right bundle branch block with left anterior fascicular block)   3. 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 Mixed hyperlipidemia    PLAN:    In order of problems listed above:  He continues to do well with CAD asymptomatic no anginal discomfort EKG stable continue his current treatment including aspirin his statin and beta-blocker.   Stable or improved conduction delay on EKG today Repeat blood pressure by me 140/80 he follows with nephrology blood pressure at target and will continue his current treatment including resin binder for hyperkalemia Continue his current status lipids are at target   Next appointment: 6 months   Medication Adjustments/Labs and Tests Ordered: Current medicines are reviewed at length with the patient today.  Concerns regarding medicines are outlined above.  Orders Placed This Encounter  Procedures   EKG 12-Lead   No orders of the defined types were placed in this encounter.    History of Present Illness:    David Baxter is a 82 y.o. male with a hx of CAD with 2 drug-eluting stents left circumflex coronary artery in 2016 hypertension bifascicular heart block stage III CKD and hyperkalemia orthostatic hypotension type 2 diabetes and hyper lipedema last seen 08/16/2022.  Compliance with diet, lifestyle and medications: Yes  He gets occasional palpitation momentary not sustained or severe and no lightheadedness. He had no angina edema shortness of breath or syncope He tolerates his statin without muscle pain or weakness labs are followed in his PCP office EKG today does not show bundle branch block  Past Medical History:  Diagnosis Date   Acquired hypothyroidism  04/08/2018   Acute retention of urine 04/09/2018   Acute sinusitis 06/05/2020   AKI (acute kidney injury) (HCC) 04/08/2018   Arthralgia of both lower legs    BMI 23.0-23.9, adult 04/21/2020   BPH (benign prostatic hyperplasia)    BPH with urinary obstruction 12/05/2019   CAD (coronary artery disease)    cardiologist--  dr Dulce Sellar w/ [facility] in [place]   CKD (chronic kidney disease) stage 3, GFR 30-59 ml/min (HCC) 07/06/2015   CKD (chronic kidney disease), stage III (HCC)    Coronary artery disease involving coronary bypass graft of native heart 04/09/2015   Overview:  1. DES OM 2012    2. 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 Use to check blood sugar twice daily as directed.   Alcohol Swabs (ALCOHOL PREP) 70 % PADS 1 each by Does not apply route daily.   allopurinol (ZYLOPRIM) 100 MG tablet Take 1 tablet (100 mg total) by mouth daily.   aspirin EC 81 MG tablet Take 81 mg by mouth daily.   bethanechol (URECHOLINE) 10 MG tablet Take 1 tablet (10 mg total) by mouth in the morning and at bedtime.   Blood Glucose  Calibration (ACCU-CHEK AVIVA) SOLN 1 each by In Vitro route every 30 (thirty) days.   Cholecalciferol (VITAMIN D-1000 MAX ST) 25 MCG (1000 UT) tablet Take 2,000 Units by mouth.    COLCRYS 0.6 MG tablet Prn gout flares   finasteride (PROSCAR) 5 MG tablet Take 1 tablet (5 mg total) by mouth daily.   gabapentin (NEURONTIN) 100 MG capsule Take 1 capsule (100 mg total) by mouth 2 (two) times daily.   levothyroxine (SYNTHROID) 150 MCG tablet Take 1 tablet (150 mcg total) by mouth daily before breakfast.   LOKELMA 10 g PACK packet Take 10 g by mouth daily.   meclizine (ANTIVERT) 25 MG tablet Take by mouth. Takes OTC prn dizziness.   metFORMIN (GLUCOPHAGE) 500 MG tablet Take 1 tablet (500 mg total) by mouth 2 (two) times daily.   metoprolol tartrate (LOPRESSOR) 25 MG tablet Take 1 tablet (25 mg total) by mouth 2 (two) times daily.   nitroGLYCERIN (NITROSTAT) 0.4 MG SL tablet Place 0.4 mg under the tongue every 5 (five) minutes as needed for chest pain.   simvastatin (ZOCOR) 20 MG tablet Take 1 tablet (20 mg total) by mouth daily.   sodium bicarbonate 650 MG tablet Take by mouth 2 (two) times daily.   tiZANidine (ZANAFLEX) 4 MG tablet Take 1 tablet (4 mg total) by mouth at bedtime.   vitamin B-12 (CYANOCOBALAMIN) 1000 MCG tablet Take 1,000 mcg by mouth daily.      EKGs/Labs/Other Studies Reviewed:    The following studies were reviewed today:  EKG Interpretation Date/Time:  Tuesday August 16 2023 08:37:30 EDT Ventricular Rate:  78 PR Interval:  192 QRS Duration:  94 QT Interval:  406 QTC Calculation: 462 R Axis:   -32  Text Interpretation: Sinus rhythm with occasional Premature ventricular complexes Left axis deviation Left ventricular hypertrophy ( R in aVL , Cornell product , Romhilt-Estes ) Lateral infarct , age undetermined No previous ECGs available Confirmed by Norman Herrlich ([id number]) on 08/16/2023 8:48:55 AM   Recent Labs: 02/21/2023: ALT 10; BUN 19; Creatinine, Ser 1.52; Hemoglobin 14.4;  Platelets 373; Potassium 5.2; Sodium 143; TSH 3.910  Recent Lipid Panel    Component Value Date/Time   CHOL 130 02/21/2023 1106   TRIG 65 02/21/2023 1106   HDL 51 02/21/2023 1106   CHOLHDL 2.5 02/21/2023 1106   LDLCALC 65 02/21/2023 1106    Physical Exam:    VS:  BP (!) 150/72   Pulse 75   SpO2 97%     Wt Readings from Last 3 Encounters:  02/21/23 172 lb 3.2 oz (78.1 kg)  12/01/22 179 lb (81.2 kg)  11/02/22 173 lb (78.5 kg)     GEN:  Well nourished, well developed in no acute distress HEENT: Normal NECK: No JVD; No carotid bruits LYMPHATICS: No lymphadenopathy CARDIAC: RRR, no murmurs, rubs, gallops RESPIRATORY:  Clear to auscultation  without rales, wheezing or rhonchi  ABDOMEN: Soft, non-tender, non-distended MUSCULOSKELETAL:  No edema; No deformity  SKIN: Warm and dry NEUROLOGIC:  Alert and oriented x 3 PSYCHIATRIC:  Normal affect    Signed, Norman Herrlich, MD  08/16/2023 8:58 AM    [facility]

## 2023-08-16 ENCOUNTER — Encounter: Payer: Self-pay | Admitting: Cardiology

## 2023-08-16 ENCOUNTER — Ambulatory Visit: Payer: Medicare PPO | Attending: Cardiology | Admitting: Cardiology

## 2023-08-16 VITALS — BP 150/72 | HR 75

## 2023-08-16 DIAGNOSIS — N1832 Chronic kidney disease, stage 3b: Secondary | ICD-10-CM | POA: Diagnosis not present

## 2023-08-16 DIAGNOSIS — I452 Bifascicular block: Secondary | ICD-10-CM | POA: Diagnosis not present

## 2023-08-16 DIAGNOSIS — E782 Mixed hyperlipidemia: Secondary | ICD-10-CM | POA: Diagnosis not present

## 2023-08-16 DIAGNOSIS — I2510 Atherosclerotic heart disease of native coronary artery without angina pectoris: Secondary | ICD-10-CM | POA: Diagnosis not present

## 2023-08-16 DIAGNOSIS — I129 Hypertensive chronic kidney disease with stage 1 through stage 4 chronic kidney disease, or unspecified chronic kidney disease: Secondary | ICD-10-CM | POA: Diagnosis not present

## 2023-08-16 NOTE — Patient Instructions (Signed)

## 2023-08-22 ENCOUNTER — Ambulatory Visit (HOSPITAL_COMMUNITY)
Admission: RE | Admit: 2023-08-22 | Discharge: 2023-08-22 | Disposition: A | Discharge status: Home / Self Care | Payer: Medicare PPO | Source: Ambulatory Visit | Attending: Urology | Admitting: Urology

## 2023-08-22 DIAGNOSIS — N1330 Unspecified hydronephrosis: Secondary | ICD-10-CM | POA: Diagnosis not present

## 2023-08-22 DIAGNOSIS — N189 Chronic kidney disease, unspecified: Secondary | ICD-10-CM | POA: Diagnosis not present

## 2023-08-24 ENCOUNTER — Ambulatory Visit: Payer: Medicare PPO | Admitting: Urology

## 2023-08-24 VITALS — BP 138/84 | HR 96

## 2023-08-24 DIAGNOSIS — N401 Enlarged prostate with lower urinary tract symptoms: Secondary | ICD-10-CM

## 2023-08-24 DIAGNOSIS — R339 Retention of urine, unspecified: Secondary | ICD-10-CM | POA: Diagnosis not present

## 2023-08-24 DIAGNOSIS — N1330 Unspecified hydronephrosis: Secondary | ICD-10-CM

## 2023-08-24 MED ORDER — BETHANECHOL CHLORIDE 10 MG PO TABS
10.0000 mg | ORAL_TABLET | Freq: Two times a day (BID) | ORAL | 3 refills | Status: DC
Start: 2023-08-24 — End: 2023-09-27

## 2023-08-24 NOTE — Progress Notes (Signed)
08/24/2023 3:37 PM   David Baxter 06-13-1941 [id number]  Referring provider: Marianne Sofia, PA-C [address] [place],  [place] [address]  Followup BPh and hydronephrosis   HPI: [name] is a 82yo here for followup for BPH with urinary retention and hydronephrosis. Renal US shows no hydronephrosis. IPSS 16 QOL 1 on bethanecol 10mg  BID. Urine stream strong. No straining to urinate. Nocturia 1x. No other complaints today   PMH: Past Medical History:  Diagnosis Date   Acquired hypothyroidism 04/08/2018   Acute retention of urine 04/09/2018   Acute sinusitis 06/05/2020   AKI (acute kidney injury) (HCC) 04/08/2018   Arthralgia of both lower legs    BMI 23.0-23.9, adult 04/21/2020   BPH (benign prostatic hyperplasia)    BPH with urinary obstruction 12/05/2019   CAD (coronary artery disease)    cardiologist--  dr Dulce Sellar w/ [facility] in [place]   CKD (chronic kidney disease) stage 3, GFR 30-59 ml/min (HCC) 07/06/2015   CKD (chronic kidney disease), stage III (HCC)    Coronary artery disease involving coronary bypass graft of native heart 04/09/2015   Overview:  1. DES OM 2012    2. 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   SIRS (systemic inflammatory response syndrome) (HCC) 04/08/2018   Type 2 diabetes mellitus (HCC) 04/08/2018   UTI (urinary tract infection) 04/08/2018    Surgical History: Past Surgical History:  Procedure Laterality Date   CORONARY  ANGIOPLASTY WITH STENT PLACEMENT  03/04/2015   x2 DES to CFX   CORONARY ANGIOPLASTY WITH STENT PLACEMENT  2012   x1 DES to OM   CYSTOSCOPY WITH INSERTION OF UROLIFT N/A 02/14/2017   Procedure: CYSTOSCOPY WITH INSERTION OF UROLIFT;  Surgeon: Malen Gauze, MD;  Location: [facility];  Service: Urology;  Laterality: N/A;   EXTRACORPOREAL SHOCK WAVE LITHOTRIPSY  1990's   PERCUTANEOUS NEPHROSTOLITHOTOMY  1991   and abdominal hernia repair   URETEROLITHOTOMY  1991 and 1994    Home Medications:  Allergies as of 08/24/2023       Reactions   Tamsulosin Other (See Comments)   Aspirin Other (See Comments)   Upset stomach        Medication List        Accurate as of August 24, 2023  3:37 PM. If you have any questions, ask your nurse or doctor.          Accu-Chek Aviva Plus test strip Generic drug: glucose blood USE TWICE DAILY AS DIRECTED   Accu-Chek Aviva Soln 1 each by In Vitro route every 30 (thirty) days.   Accu-Chek FastClix Lancets Misc 1 each by Does not apply route in the morning and at bedtime. Use to check blood sugar twice daily as directed.   Alcohol Prep 70 % Pads 1 each by Does not apply route daily.   allopurinol 100 MG  tablet Commonly known as: ZYLOPRIM Take 1 tablet (100 mg total) by mouth daily.   aspirin EC 81 MG tablet Take 81 mg by mouth daily.   bethanechol 10 MG tablet Commonly known as: URECHOLINE Take 1 tablet (10 mg total) by mouth in the morning and at bedtime.   Colcrys 0.6 MG tablet Generic drug: colchicine Prn gout flares   cyanocobalamin 1000 MCG tablet Commonly known as: VITAMIN B12 Take 1,000 mcg by mouth daily.   finasteride 5 MG tablet Commonly known as: PROSCAR Take 1 tablet (5 mg total) by mouth daily.   gabapentin 100 MG capsule Commonly known as: NEURONTIN Take 1 capsule (100 mg total) by mouth 2 (two) times daily.   levothyroxine 150 MCG tablet Commonly known as: SYNTHROID Take 1 tablet (150 mcg  total) by mouth daily before breakfast.   Lokelma 10 g Pack packet Generic drug: sodium zirconium cyclosilicate Take 10 g by mouth daily.   meclizine 25 MG tablet Commonly known as: ANTIVERT Take by mouth. Takes OTC prn dizziness.   metFORMIN 500 MG tablet Commonly known as: GLUCOPHAGE Take 1 tablet (500 mg total) by mouth 2 (two) times daily.   metoprolol tartrate 25 MG tablet Commonly known as: LOPRESSOR Take 1 tablet (25 mg total) by mouth 2 (two) times daily.   nitroGLYCERIN 0.4 MG SL tablet Commonly known as: NITROSTAT Place 0.4 mg under the tongue every 5 (five) minutes as needed for chest pain.   simvastatin 20 MG tablet Commonly known as: ZOCOR Take 1 tablet (20 mg total) by mouth daily.   sodium bicarbonate 650 MG tablet Take by mouth 2 (two) times daily.   tiZANidine 4 MG tablet Commonly known as: ZANAFLEX Take 1 tablet (4 mg total) by mouth at bedtime.   Vitamin D-1000 Max St 25 MCG (1000 UT) tablet Generic drug: Cholecalciferol Take 2,000 Units by mouth.        Allergies:  Allergies  Allergen Reactions   Tamsulosin Other (See Comments)   Aspirin Other (See Comments)    Upset stomach    Family History: Family History  Problem Relation Age of Onset   Heart disease Mother 40       After age 4 - CHF   Hypertension Father    Heart disease Father 60       After age 68 - CHF    Social History:  reports that he has never smoked. He has never been exposed to tobacco smoke. He has never used smokeless tobacco. He reports that he does not drink alcohol and does not use drugs.  ROS: All other review of systems were reviewed and are negative except what is noted above in HPI  Physical Exam: BP 138/84   Pulse 96   Constitutional:  Alert and oriented, No acute distress. HEENT: [place] AT, moist mucus membranes.  Trachea midline, no masses. Cardiovascular: No clubbing, cyanosis, or edema. Respiratory: Normal respiratory effort, no increased work of  breathing. GI: Abdomen is soft, nontender, nondistended, no abdominal masses GU: No CVA tenderness.  Lymph: No cervical or inguinal lymphadenopathy. Skin: No rashes, bruises or suspicious lesions. Neurologic: Grossly intact, no focal deficits, moving all 4 extremities. Psychiatric: Normal mood and affect.  Laboratory Data: Lab Results  Component Value Date   WBC 9.2 02/21/2023   HGB 14.4 02/21/2023   HCT 45.4 02/21/2023   MCV 91 02/21/2023   PLT 373 02/21/2023    Lab Results  Component Value Date   CREATININE 1.52 (H) 02/21/2023  No results found for: "PSA"  No results found for: "TESTOSTERONE"  Lab Results  Component Value Date   HGBA1C 6.0 (H) 02/21/2023    Urinalysis    Component Value Date/Time   APPEARANCEUR Clear 02/23/2023 1127   GLUCOSEU Negative 02/23/2023 1127   BILIRUBINUR Negative 02/23/2023 1127   PROTEINUR Negative 02/23/2023 1127   NITRITE Negative 02/23/2023 1127   LEUKOCYTESUR Trace (A) 02/23/2023 1127    Lab Results  Component Value Date   LABMICR See below: 02/23/2023   WBCUA 6-10 (A) 02/23/2023   LABEPIT 0-10 02/23/2023   BACTERIA None seen 02/23/2023    Pertinent Imaging: Renal US 08/22/2023: Images reviewed and discussed with the patient  No results found for this or any previous visit.  No results found for this or any previous visit.  No results found for this or any previous visit.  No results found for this or any previous visit.  Results for orders placed during the hospital encounter of 08/22/23  Ultrasound renal complete  Narrative CLINICAL DATA:  Nephrolithiasis  EXAM: RENAL / URINARY TRACT ULTRASOUND COMPLETE  COMPARISON:  Renal ultrasound 02/16/2023  FINDINGS: Right Kidney:  Renal measurements: 9.8 x 4.6 x 4.3 cm = volume: 100.3 mL. Normal renal cortical thickness. Increased cortical echogenicity. No hydronephrosis. There is a 3.9 cm cyst and a 1.7 cm cyst. No imaging follow-up needed.  Left  Kidney:  Renal measurements: 9.1 x 5.1 x 4.1 cm = volume: 95.9 mL. Normal renal cortical thickness. Increased cortical echogenicity. No hydronephrosis or mass.  Bladder:  Appears normal for degree of bladder distention.  Other:  None.  IMPRESSION: 1. No hydronephrosis. 2. Increased cortical echogenicity as can be seen with chronic medical renal disease.   Electronically Signed By: Annia Belt M.D. On: 08/22/2023 12:24  No valid procedures specified. No results found for this or any previous visit.  No results found for this or any previous visit.   Assessment & Plan:    1. Hydronephrosis, unspecified hydronephrosis type -followup 1 year with renal US - Urinalysis, Routine w reflex microscopic  2. Retention, urine -continue bethanecol 10mg  BID  3. Benign localized prostatic hyperplasia with lower urinary tract symptoms (LUTS) -continue bethanecol 10mg  BID   No follow-ups on file.  Wilkie Aye, MD  [facility] [place]

## 2023-08-25 LAB — URINALYSIS, ROUTINE W REFLEX MICROSCOPIC
Bilirubin, UA: NEGATIVE
Glucose, UA: NEGATIVE
Ketones, UA: NEGATIVE
Nitrite, UA: NEGATIVE
Protein,UA: NEGATIVE
RBC, UA: NEGATIVE
Specific Gravity, UA: 1.015 (ref 1.005–1.030)
Urobilinogen, Ur: 0.2 mg/dL (ref 0.2–1.0)
pH, UA: 7 (ref 5.0–7.5)

## 2023-08-25 LAB — MICROSCOPIC EXAMINATION
Bacteria, UA: NONE SEEN
RBC, Urine: NONE SEEN /[HPF] (ref 0–2)

## 2023-08-28 ENCOUNTER — Encounter: Payer: Self-pay | Admitting: Urology

## 2023-08-28 NOTE — Patient Instructions (Signed)

## 2023-08-29 ENCOUNTER — Encounter: Payer: Self-pay | Admitting: Physician Assistant

## 2023-08-29 ENCOUNTER — Ambulatory Visit (INDEPENDENT_AMBULATORY_CARE_PROVIDER_SITE_OTHER): Payer: Medicare PPO | Admitting: Physician Assistant

## 2023-08-29 VITALS — BP 140/80 | HR 77 | Temp 96.8°F | Resp 14 | Ht 69.0 in | Wt 162.0 lb

## 2023-08-29 DIAGNOSIS — E559 Vitamin D deficiency, unspecified: Secondary | ICD-10-CM | POA: Insufficient documentation

## 2023-08-29 DIAGNOSIS — E785 Hyperlipidemia, unspecified: Secondary | ICD-10-CM | POA: Diagnosis not present

## 2023-08-29 DIAGNOSIS — Z23 Encounter for immunization: Secondary | ICD-10-CM | POA: Diagnosis not present

## 2023-08-29 DIAGNOSIS — E039 Hypothyroidism, unspecified: Secondary | ICD-10-CM

## 2023-08-29 DIAGNOSIS — I25708 Atherosclerosis of coronary artery bypass graft(s), unspecified, with other forms of angina pectoris: Secondary | ICD-10-CM | POA: Diagnosis not present

## 2023-08-29 DIAGNOSIS — E1159 Type 2 diabetes mellitus with other circulatory complications: Secondary | ICD-10-CM

## 2023-08-29 DIAGNOSIS — E1122 Type 2 diabetes mellitus with diabetic chronic kidney disease: Secondary | ICD-10-CM

## 2023-08-29 DIAGNOSIS — Z8739 Personal history of other diseases of the musculoskeletal system and connective tissue: Secondary | ICD-10-CM

## 2023-08-29 DIAGNOSIS — E1169 Type 2 diabetes mellitus with other specified complication: Secondary | ICD-10-CM | POA: Diagnosis not present

## 2023-08-29 DIAGNOSIS — N1832 Chronic kidney disease, stage 3b: Secondary | ICD-10-CM | POA: Diagnosis not present

## 2023-08-29 DIAGNOSIS — L602 Onychogryphosis: Secondary | ICD-10-CM

## 2023-08-29 DIAGNOSIS — N401 Enlarged prostate with lower urinary tract symptoms: Secondary | ICD-10-CM

## 2023-08-29 DIAGNOSIS — I152 Hypertension secondary to endocrine disorders: Secondary | ICD-10-CM | POA: Diagnosis not present

## 2023-08-29 DIAGNOSIS — N183 Chronic kidney disease, stage 3 unspecified: Secondary | ICD-10-CM | POA: Diagnosis not present

## 2023-08-29 HISTORY — DX: Encounter for immunization: Z23

## 2023-08-29 HISTORY — DX: Vitamin D deficiency, unspecified: E55.9

## 2023-08-29 HISTORY — DX: Onychogryphosis: L60.2

## 2023-08-29 NOTE — Progress Notes (Signed)
Subjective:  Patient ID: David Baxter, male    DOB: Nov 12, 1940  Age: 82 y.o. MRN: [id number]  Chief Complaint  Patient presents with   Medical Management of Chronic Issues    HPI   Patient present with type 2 diabetes controlled without use of insulin.   Compliance with treatment has been good; patient take medicines as directed, maintains diet and exercise regimen, follows up as directed, and is keeping glucose diary. His fasting glucose ranges 80s-110. Current medicines for diabetes Metformin 500 mg twice a day. Patient performs foot exams daily and last ophthalmologic exam was 2/24. Last A1C was 6.0 %  Patient presents with hyperlipidemia.  Compliance with treatment has been good; patient takes medicines as directed, maintains low cholesterol diet, follows up as directed, and maintains exercise regimen.  Patient is using Simvastatin 20 mg daily without problems.   Patient presents for follow up of hypertension.  Patient tolerating Metoprolol 25 mg twice a day, Aspirin 81 mg daily well without side effects. Patient is working on maintaining diet and exercise regimen and follows up as directed.  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 had 5 prior stents) - follows with Dr Dulce Sellar (cardiology) regularly and his last appt was 10/29 His bp is elevated today initially but after 10 min down to 140/80 He denies chest pain/sob/edema  Hypothyroidism: Patient is taking Levothyroxine 150 mcg daily.- due for labwork  GOUT: Taking Allopurinol 100 mg daily, Colcrys 0.6 mg PRN for gout flares  Pt with history of BPH - currently on Proscar 5mg  qd and bethanechol 10mg  qd - and follows with urologist Dr Ronne Binning regularly  Pt also with history of chronic kidney diseases stage 3b - does follow with nephrologist "Dr A" in [facility] regularly - currently takes lokelma 10g pack and sodium bicarb 650mg   Pt does wear hearing aids with history of hearing loss- has appt tomorrow with ENT to have ears irrigated  Pt has  thickened toenails and would like referral to podiatry  Pt would like flu shot today Current Outpatient Medications on File Prior to Visit  Medication Sig Dispense Refill   ACCU-CHEK AVIVA PLUS test strip USE TWICE DAILY AS DIRECTED 200 strip 10   Accu-Chek FastClix Lancets MISC 1 each by Does not apply route in the morning and at bedtime. Use to check blood sugar twice daily as directed. 100 each 6   Alcohol Swabs (ALCOHOL PREP) 70 % PADS 1 each by Does not apply route daily. 100 each 2   allopurinol (ZYLOPRIM) 100 MG tablet Take 1 tablet (100 mg total) by mouth daily. 90 tablet 1   aspirin EC 81 MG tablet Take 81 mg by mouth daily.     bethanechol (URECHOLINE) 10 MG tablet Take 1 tablet (10 mg total) by mouth in the morning and at bedtime. 180 tablet 3   Blood Glucose Calibration (ACCU-CHEK AVIVA) SOLN 1 each by In Vitro route every 30 (thirty) days. 1 each 5   Cholecalciferol (VITAMIN D-1000 MAX ST) 25 MCG (1000 UT) tablet Take 2,000 Units by mouth.      COLCRYS 0.6 MG tablet Prn gout flares     finasteride (PROSCAR) 5 MG tablet Take 1 tablet (5 mg total) by mouth daily. 90 tablet 3   gabapentin (NEURONTIN) 100 MG capsule Take 1 capsule (100 mg total) by mouth 2 (two) times daily. 180 capsule 0   levothyroxine (SYNTHROID) 150 MCG tablet Take 1 tablet (150 mcg total) by mouth daily before breakfast. 90 tablet  0   LOKELMA 10 g PACK packet Take 10 g by mouth daily. 90 each 1   meclizine (ANTIVERT) 25 MG tablet Take by mouth. Takes OTC prn dizziness.     metFORMIN (GLUCOPHAGE) 500 MG tablet Take 1 tablet (500 mg total) by mouth 2 (two) times daily. 180 tablet 0   metoprolol tartrate (LOPRESSOR) 25 MG tablet Take 1 tablet (25 mg total) by mouth 2 (two) times daily. 180 tablet 0   nitroGLYCERIN (NITROSTAT) 0.4 MG SL tablet Place 0.4 mg under the tongue every 5 (five) minutes as needed for chest pain.     simvastatin (ZOCOR) 20 MG tablet Take 1 tablet (20 mg total) by mouth daily. 90 tablet 1    sodium bicarbonate 650 MG tablet Take by mouth 2 (two) times daily.     No current facility-administered medications on file prior to visit.   Past Medical History:  Diagnosis Date   Acquired hypothyroidism 04/08/2018   Acute retention of urine 04/09/2018   Acute sinusitis 06/05/2020   AKI (acute kidney injury) (HCC) 04/08/2018   Arthralgia of both lower legs    BMI 23.0-23.9, adult 04/21/2020   BPH (benign prostatic hyperplasia)    BPH with urinary obstruction 12/05/2019   CAD (coronary artery disease)    cardiologist--  dr Dulce Sellar w/ [facility] in [place]   CKD (chronic kidney disease) stage 3, GFR 30-59 ml/min (HCC) 07/06/2015   CKD (chronic kidney disease), stage III (HCC)    Coronary artery disease involving coronary bypass graft of native heart 04/09/2015   Overview:  1. DES OM 2012    2. 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   SIRS (systemic inflammatory response syndrome) (HCC) 04/08/2018   Type 2 diabetes mellitus (HCC) 04/08/2018   UTI (urinary tract infection) 04/08/2018   Past Surgical History:  Procedure Laterality Date   CORONARY ANGIOPLASTY WITH STENT PLACEMENT  03/04/2015   x2 DES to CFX   CORONARY ANGIOPLASTY WITH STENT PLACEMENT  2012   x1 DES to OM   CYSTOSCOPY WITH INSERTION OF UROLIFT N/A 02/14/2017   Procedure: CYSTOSCOPY WITH INSERTION OF UROLIFT;  Surgeon: Malen Gauze, MD;  Location: [facility];  Service: Urology;   Laterality: N/A;   EXTRACORPOREAL SHOCK WAVE LITHOTRIPSY  1990's   PERCUTANEOUS NEPHROSTOLITHOTOMY  1991   and abdominal hernia repair   URETEROLITHOTOMY  1991 and 1994    Family History  Problem Relation Age of Onset   Heart disease Mother 24       After age 78 - CHF   Hypertension Father    Heart disease Father 104       After age 72 - CHF   Social History   Socioeconomic History   Marital status: Married    Spouse name: Not on file   Number of children: Not on file   Years of education:  Not on file   Highest education level: Not on file  Occupational History   Occupation: truck driver  Tobacco Use   Smoking status: Never    Passive exposure: Never   Smokeless tobacco: Never  Vaping Use   Vaping status: Never Used  Substance and Sexual Activity   Alcohol use: No   Drug use: No   Sexual activity: Yes    Partners: Female  Other Topics Concern   Not on file  Social History Narrative   Not on file   Social Determinants of Health   Financial Resource Strain: High Risk (08/18/2022)   Overall Financial Resource Strain (CARDIA)    Difficulty of Paying Living Expenses: Hard  Food Insecurity: No Food Insecurity (06/11/2020)   Hunger Vital Sign    Worried About Running Out of Food in the Last Year: Never true    Ran Out of Food in the Last Year: Never true  Transportation Needs: No Transportation Needs (08/18/2022)   PRAPARE - Administrator, Civil Service (Medical): No    Lack of Transportation (Non-Medical): No  Physical Activity: Not on file  Stress: Not on file  Social Connections: Not on file    CONSTITUTIONAL: Negative for chills, fatigue, fever, unintentional weight gain and unintentional weight loss.  E/N/T: see HPI CARDIOVASCULAR: Negative for chest pain, dizziness, palpitations and pedal edema.  RESPIRATORY: Negative for recent cough and dyspnea.  GASTROINTESTINAL: Negative for abdominal pain, acid reflux symptoms, constipation, diarrhea, nausea  and vomiting.  MSK: Negative for arthralgias and myalgias.  INTEGUMENTARY: Negative for rash. - thickened toenails NEUROLOGICAL: Negative for dizziness and headaches.  PSYCHIATRIC: Negative for sleep disturbance and to question depression screen.  Negative for depression, negative for anhedonia.       Objective:  PHYSICAL EXAM:   VS: BP (!) 140/80   Pulse 77   Temp (!) 96.8 F (36 C)   Resp 14   Ht 5\' 9"  (1.753 m)   Wt 162 lb (73.5 kg)   SpO2 99%   BMI 23.92 kg/m   GEN: Well nourished, well developed, in no acute distress  HEENT: both TMS obscured by cerumen Oropharynx - normal mucosa, palate, and posterior pharynx  Cardiac: RRR; no murmurs, rubs, or gallops,no edema -  Respiratory:  normal respiratory rate and pattern with no distress - normal breath sounds with no rales, rhonchi, wheezes or rubs  MS: no deformity or atrophy  Skin: warm and dry, no rash - thickened toenails noted Neuro:  Alert and Oriented x 3, - CN II-Xii grossly intact Psych: euthymic mood, appropriate affect and demeanor  Diabetic Foot Exam - Simple   Simple Foot Form Diabetic Foot exam was performed with the following findings: Yes 08/29/2023  9:01 AM  Visual Inspection See comments: Yes Sensation Testing Intact to touch and monofilament testing bilaterally: Yes Pulse Check Posterior Tibialis and Dorsalis pulse intact bilaterally: Yes Comments Patient has thick toenails on both feet and dry skin on the sole and heels.     Lab Results  Component Value Date   WBC 9.2 02/21/2023   HGB 14.4 02/21/2023   HCT 45.4 02/21/2023   PLT 373 02/21/2023   GLUCOSE 100 (H) 02/21/2023   CHOL 130 02/21/2023   TRIG 65 02/21/2023   HDL 51 02/21/2023   LDLCALC 65 02/21/2023   ALT 10 02/21/2023   AST 15 02/21/2023   NA 143 02/21/2023   K 5.2 02/21/2023   CL 103 02/21/2023   CREATININE 1.52 (H) 02/21/2023  BUN 19 02/21/2023   CO2 26 02/21/2023   TSH 3.910 02/21/2023   HGBA1C 6.0 (H) 02/21/2023    MICROALBUR 80 12/23/2020      Assessment & Plan:   Problem List Items Addressed This Visit       Cardiovascular and Mediastinum   Coronary artery disease of bypass graft of native heart with stable angina pectoris (HCC) Continue current meds Follow up with cardiology as directed  Patient has not had recent angina or nitroglycerin use.     Hypertension associated with diabetes (HCC) Continue current meds     Endocrine   Type 2 diabetes mellitus with other specified complication (HCC) - Primary Conitnue meds as directed Watch diet Labwork pending Urine microalbumin sent to [facility]     Other   Hyperlipidemia associated with diabetes (HCC) Continue med  Watch diet  History of gout Uric acid level pending Continue meds  Chronic kidney disease stage 3b Continue meds and follow up with nephrology  Hypothyroidism TSH pending Continue levothyroxine  BPH Continue meds and follow up with urologist as scheduled   Other Visit Diagnoses     Need flu shot Trivalent fluad given  Need for COVID vaccine COVID given           .    Orders Placed This Encounter  Procedures   Flu Vaccine Trivalent High Dose (Fluad)   Pfizer Comirnaty Covid-19 Vaccine 72yrs & older   CBC with Differential/Platelet   Comprehensive metabolic panel   TSH   Lipid panel   Hemoglobin A1c   PSA   Uric acid   VITAMIN D 25 Hydroxy (Vit-D Deficiency, Fractures)   Microalbumin / creatinine urine ratio   Ambulatory referral to Podiatry     Follow-up: Return in about 6 months (around 02/26/2024) for chronic fasting follow-up.  An After Visit Summary was printed and given to the patient.  Jettie Pagan [facility] [phone]

## 2023-08-30 DIAGNOSIS — H6123 Impacted cerumen, bilateral: Secondary | ICD-10-CM | POA: Diagnosis not present

## 2023-08-30 LAB — COMPREHENSIVE METABOLIC PANEL
ALT: 10 [IU]/L (ref 0–44)
AST: 15 [IU]/L (ref 0–40)
Albumin: 4.2 g/dL (ref 3.7–4.7)
Alkaline Phosphatase: 91 [IU]/L (ref 44–121)
BUN/Creatinine Ratio: 13 (ref 10–24)
BUN: 23 mg/dL (ref 8–27)
Bilirubin Total: 0.4 mg/dL (ref 0.0–1.2)
CO2: 23 mmol/L (ref 20–29)
Calcium: 9.8 mg/dL (ref 8.6–10.2)
Chloride: 102 mmol/L (ref 96–106)
Creatinine, Ser: 1.72 mg/dL — ABNORMAL HIGH (ref 0.76–1.27)
Globulin, Total: 2.7 g/dL (ref 1.5–4.5)
Glucose: 105 mg/dL — ABNORMAL HIGH (ref 70–99)
Potassium: 5.9 mmol/L — ABNORMAL HIGH (ref 3.5–5.2)
Sodium: 144 mmol/L (ref 134–144)
Total Protein: 6.9 g/dL (ref 6.0–8.5)
eGFR: 39 mL/min/{1.73_m2} — ABNORMAL LOW (ref >59)

## 2023-08-30 LAB — CBC WITH DIFFERENTIAL/PLATELET
Basophils Absolute: 0.1 10*3/uL (ref 0.0–0.2)
Basos: 1 %
EOS (ABSOLUTE): 0.5 10*3/uL — ABNORMAL HIGH (ref 0.0–0.4)
Eos: 6 %
Hematocrit: 45.4 % (ref 37.5–51.0)
Hemoglobin: 14.8 g/dL (ref 13.0–17.7)
Immature Grans (Abs): 0 10*3/uL (ref 0.0–0.1)
Immature Granulocytes: 0 %
Lymphocytes Absolute: 3 10*3/uL (ref 0.7–3.1)
Lymphs: 33 %
MCH: 29.2 pg (ref 26.6–33.0)
MCHC: 32.6 g/dL (ref 31.5–35.7)
MCV: 90 fL (ref 79–97)
Monocytes Absolute: 0.9 10*3/uL (ref 0.1–0.9)
Monocytes: 10 %
Neutrophils Absolute: 4.5 10*3/uL (ref 1.4–7.0)
Neutrophils: 50 %
Platelets: 367 10*3/uL (ref 150–450)
RBC: 5.07 x10E6/uL (ref 4.14–5.80)
RDW: 14.1 % (ref 11.6–15.4)
WBC: 9.1 10*3/uL (ref 3.4–10.8)

## 2023-08-30 LAB — LIPID PANEL
Chol/HDL Ratio: 2.7 ratio (ref 0.0–5.0)
Cholesterol, Total: 147 mg/dL (ref 100–199)
HDL: 55 mg/dL (ref >39)
LDL Chol Calc (NIH): 79 mg/dL (ref 0–99)
Triglycerides: 65 mg/dL (ref 0–149)
VLDL Cholesterol Cal: 13 mg/dL (ref 5–40)

## 2023-08-30 LAB — TSH: TSH: 5.86 u[IU]/mL — ABNORMAL HIGH (ref 0.450–4.500)

## 2023-08-30 LAB — HEMOGLOBIN A1C
Est. average glucose Bld gHb Est-mCnc: 128 mg/dL
Hgb A1c MFr Bld: 6.1 % — ABNORMAL HIGH (ref 4.8–5.6)

## 2023-08-30 LAB — MICROALBUMIN / CREATININE URINE RATIO
Creatinine, Urine: 57 mg/dL
Microalb/Creat Ratio: 49 mg/g{creat} — ABNORMAL HIGH (ref 0–29)
Microalbumin, Urine: 28.1 ug/mL

## 2023-08-30 LAB — VITAMIN D 25 HYDROXY (VIT D DEFICIENCY, FRACTURES): Vit D, 25-Hydroxy: 62.1 ng/mL (ref 30.0–100.0)

## 2023-08-30 LAB — PSA: Prostate Specific Ag, Serum: 0.3 ng/mL (ref 0.0–4.0)

## 2023-08-30 LAB — URIC ACID: Uric Acid: 6 mg/dL (ref 3.8–8.4)

## 2023-08-31 ENCOUNTER — Other Ambulatory Visit: Payer: Self-pay | Admitting: Physician Assistant

## 2023-08-31 ENCOUNTER — Other Ambulatory Visit: Payer: Self-pay

## 2023-08-31 DIAGNOSIS — R899 Unspecified abnormal finding in specimens from other organs, systems and tissues: Secondary | ICD-10-CM

## 2023-08-31 DIAGNOSIS — E039 Hypothyroidism, unspecified: Secondary | ICD-10-CM

## 2023-08-31 MED ORDER — LEVOTHYROXINE SODIUM 175 MCG PO TABS: 175.0000 ug | ORAL_TABLET | Freq: Every day | ORAL | 3 refills | Status: DC

## 2023-09-06 DIAGNOSIS — H9193 Unspecified hearing loss, bilateral: Secondary | ICD-10-CM | POA: Diagnosis not present

## 2023-09-14 ENCOUNTER — Telehealth: Payer: Self-pay

## 2023-09-14 NOTE — Telephone Encounter (Signed)
ATTEMPTED TO CALL PATIENT IN REGARDS TO 2025 PAP RE-ENROLLMENT FOR LOKELMA ([employer]). A HIPPA COMPLIANT VOICEMAIL WAS LEFT FOR THE PATIENT TO RETURN MY CALL.

## 2023-09-14 NOTE — Telephone Encounter (Signed)
PAP application for [facility] AND [employer]))  has been mailed to pt home. I will fax PCP pages once I receive pt pages

## 2023-09-23 ENCOUNTER — Other Ambulatory Visit: Payer: Self-pay | Admitting: Urology

## 2023-09-23 ENCOUNTER — Other Ambulatory Visit: Payer: Self-pay | Admitting: Physician Assistant

## 2023-09-23 DIAGNOSIS — N401 Enlarged prostate with lower urinary tract symptoms: Secondary | ICD-10-CM

## 2023-09-23 DIAGNOSIS — E1121 Type 2 diabetes mellitus with diabetic nephropathy: Secondary | ICD-10-CM

## 2023-09-23 DIAGNOSIS — I10 Essential (primary) hypertension: Secondary | ICD-10-CM

## 2023-09-26 NOTE — Telephone Encounter (Signed)
PAP: Application for [facility] has been submitted to PAP Companies: [employer], online

## 2023-09-28 ENCOUNTER — Encounter: Payer: Self-pay | Admitting: Podiatry

## 2023-09-28 ENCOUNTER — Ambulatory Visit: Payer: Medicare PPO | Admitting: Podiatry

## 2023-09-28 DIAGNOSIS — E1151 Type 2 diabetes mellitus with diabetic peripheral angiopathy without gangrene: Secondary | ICD-10-CM

## 2023-09-28 DIAGNOSIS — M79674 Pain in right toe(s): Secondary | ICD-10-CM

## 2023-09-28 DIAGNOSIS — B351 Tinea unguium: Secondary | ICD-10-CM | POA: Diagnosis not present

## 2023-09-28 DIAGNOSIS — M79675 Pain in left toe(s): Secondary | ICD-10-CM

## 2023-09-28 NOTE — Progress Notes (Signed)
Subjective:  Patient ID: David Baxter, male    DOB: 1941-04-12,  MRN: [id number]  David Baxter presents to clinic today for:  Chief Complaint  Patient presents with   Nail Problem    Nail fungus bilat diabetic  A1C 6.1   Patient notes nails are thick, discolored, elongated and painful in shoegear when trying to ambulate.    PCP is Marianne Sofia, PA-C.  Past Medical History:  Diagnosis Date   Acquired hypothyroidism 04/08/2018   Acute retention of urine 04/09/2018   Acute sinusitis 06/05/2020   AKI (acute kidney injury) (HCC) 04/08/2018   Arthralgia of both lower legs    BMI 23.0-23.9, adult 04/21/2020   BPH (benign prostatic hyperplasia)    BPH with urinary obstruction 12/05/2019   CAD (coronary artery disease)    cardiologist--  dr Dulce Sellar w/ [facility] in [place]   CKD (chronic kidney disease) stage 3, GFR 30-59 ml/min (HCC) 07/06/2015   CKD (chronic kidney disease), stage III (HCC)    Coronary artery disease involving coronary bypass graft of native heart 04/09/2015   Overview:  1. DES OM 2012    2. 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   SIRS (systemic inflammatory response syndrome) (HCC) 04/08/2018   Type 2 diabetes mellitus (HCC) 04/08/2018   UTI (urinary tract infection) 04/08/2018   Past Surgical History:  Procedure Laterality Date   CORONARY ANGIOPLASTY WITH STENT PLACEMENT  03/04/2015   x2 DES to CFX   CORONARY ANGIOPLASTY WITH STENT  PLACEMENT  2012   x1 DES to OM   CYSTOSCOPY WITH INSERTION OF UROLIFT N/A 02/14/2017   Procedure: CYSTOSCOPY WITH INSERTION OF UROLIFT;  Surgeon: Malen Gauze, MD;  Location: [facility];  Service: Urology;  Laterality: N/A;   EXTRACORPOREAL SHOCK WAVE LITHOTRIPSY  1990's   PERCUTANEOUS NEPHROSTOLITHOTOMY  1991   and abdominal hernia repair   URETEROLITHOTOMY  1991 and 1994   Allergies  Allergen Reactions   Tamsulosin Other (See Comments)   Aspirin Other (See Comments)    Upset stomach   Review of Systems: Negative except as noted in the HPI.  Objective:  David Baxter is a pleasant 82 y.o. male in NAD. AAO x 3.  Vascular Examination: Capillary refill time is 3-5 seconds to toes bilateral. Palpable pedal pulses b/l LE. Digital hair present b/l.  Skin temperature gradient WNL b/l. No varicosities b/l. No cyanosis noted b/l.   Dermatological Examination: Pedal skin with normal turgor, texture and tone b/l. No open wounds. No interdigital macerations b/l. Toenails x10 are 3mm thick, discolored, dystrophic with subungual debris. There is pain with compression of the nail plates.  They are elongated x10  Neurological Examination: Protective sensation intact bilateral LE with Semmes Weinstein monofilament     Latest Ref Rng & Units 08/29/2023    9:13 AM 02/21/2023   11:06 AM  Hemoglobin A1C  Hemoglobin-A1c 4.8 - 5.6 % 6.1  6.0    Assessment/Plan: 1. Pain due to onychomycosis of toenails of both feet   2. Type II diabetes mellitus with peripheral circulatory disorder (HCC)     The mycotic toenails were sharply debrided x10 with sterile nail nippers and a power debriding burr to decrease bulk/thickness and length.    Recommended Formula 7 solution every day for the fungal toenails, or Vicks VapoRub to the toenails at bedtime.  Return in about 3 months (around 12/27/2023) for fungal nail recheck.   Clerance Lav, DPM, FACFAS [facility]      [address] [place], [place] [address]                Office [phone]  Fax [phone]

## 2023-10-05 DIAGNOSIS — M1000 Idiopathic gout, unspecified site: Secondary | ICD-10-CM | POA: Diagnosis not present

## 2023-10-05 DIAGNOSIS — E039 Hypothyroidism, unspecified: Secondary | ICD-10-CM | POA: Diagnosis not present

## 2023-10-05 DIAGNOSIS — E211 Secondary hyperparathyroidism, not elsewhere classified: Secondary | ICD-10-CM | POA: Diagnosis not present

## 2023-10-05 DIAGNOSIS — I10 Essential (primary) hypertension: Secondary | ICD-10-CM | POA: Diagnosis not present

## 2023-10-05 DIAGNOSIS — E559 Vitamin D deficiency, unspecified: Secondary | ICD-10-CM | POA: Diagnosis not present

## 2023-10-05 DIAGNOSIS — R809 Proteinuria, unspecified: Secondary | ICD-10-CM | POA: Diagnosis not present

## 2023-10-05 DIAGNOSIS — E1169 Type 2 diabetes mellitus with other specified complication: Secondary | ICD-10-CM | POA: Diagnosis not present

## 2023-10-05 DIAGNOSIS — E875 Hyperkalemia: Secondary | ICD-10-CM | POA: Diagnosis not present

## 2023-10-05 DIAGNOSIS — N139 Obstructive and reflux uropathy, unspecified: Secondary | ICD-10-CM | POA: Diagnosis not present

## 2023-10-05 DIAGNOSIS — N400 Enlarged prostate without lower urinary tract symptoms: Secondary | ICD-10-CM | POA: Diagnosis not present

## 2023-10-05 DIAGNOSIS — M109 Gout, unspecified: Secondary | ICD-10-CM | POA: Diagnosis not present

## 2023-10-05 DIAGNOSIS — R309 Painful micturition, unspecified: Secondary | ICD-10-CM | POA: Diagnosis not present

## 2023-10-05 DIAGNOSIS — N183 Chronic kidney disease, stage 3 unspecified: Secondary | ICD-10-CM | POA: Diagnosis not present

## 2023-10-07 NOTE — Telephone Encounter (Signed)
PAP: Patient assistance application for lokelma has been approved by PAP Companies: [employer] from 10/19/2023 to 10/17/2024. Medication should be delivered to PAP Delivery: Home For further shipping updates, please contact [employer] (AZ[employer]Me) at [phone] Pt ID is: [id number]

## 2023-10-07 NOTE — Progress Notes (Signed)
Patient ID: David Baxter, male   DOB: 06-02-1941, 82 y.o.   MRN: Pharmacy Medication Assistance Program Note    10/07/2023  Patient ID: David Baxter, male   DOB: 03-29-1941, 82 y.o.   MRN: [id number]     09/14/2023  Outreach Medication One  Initial Outreach Date (Medication One) 09/14/2023  Manufacturer Medication One [employer]  [employer] [facility] Other Music therapist Drug  Type of Radiographer, therapeutic Assistance  Date Application Sent to Prescriber 09/14/2023  Name of Prescriber Kirsten Cox  Date Application Received From Patient 09/26/2023  Application Items Received From Patient Application;Proof of Income  Date Application Received From Provider 09/22/2023  Date Application Submitted to Manufacturer 09/26/2023  Method Application Sent to Manufacturer Fax  Patient Assistance Determination Approved  Approval Start Date 10/19/2023  Approval End Date 10/17/2024  Patient Notification Method Telephone Call  Telephone Call Outcome Left Voicemail     Signature

## 2023-10-19 DIAGNOSIS — E1122 Type 2 diabetes mellitus with diabetic chronic kidney disease: Secondary | ICD-10-CM | POA: Diagnosis not present

## 2023-10-19 DIAGNOSIS — R6520 Severe sepsis without septic shock: Secondary | ICD-10-CM | POA: Diagnosis not present

## 2023-10-19 DIAGNOSIS — D72829 Elevated white blood cell count, unspecified: Secondary | ICD-10-CM | POA: Diagnosis not present

## 2023-10-19 DIAGNOSIS — N179 Acute kidney failure, unspecified: Secondary | ICD-10-CM | POA: Diagnosis not present

## 2023-10-19 DIAGNOSIS — R112 Nausea with vomiting, unspecified: Secondary | ICD-10-CM | POA: Diagnosis not present

## 2023-10-19 DIAGNOSIS — A419 Sepsis, unspecified organism: Secondary | ICD-10-CM | POA: Diagnosis not present

## 2023-10-19 DIAGNOSIS — J9601 Acute respiratory failure with hypoxia: Secondary | ICD-10-CM | POA: Diagnosis not present

## 2023-10-19 DIAGNOSIS — E876 Hypokalemia: Secondary | ICD-10-CM | POA: Diagnosis not present

## 2023-10-19 DIAGNOSIS — I129 Hypertensive chronic kidney disease with stage 1 through stage 4 chronic kidney disease, or unspecified chronic kidney disease: Secondary | ICD-10-CM | POA: Diagnosis not present

## 2023-10-19 DIAGNOSIS — I2510 Atherosclerotic heart disease of native coronary artery without angina pectoris: Secondary | ICD-10-CM | POA: Diagnosis not present

## 2023-10-19 DIAGNOSIS — J168 Pneumonia due to other specified infectious organisms: Secondary | ICD-10-CM | POA: Diagnosis not present

## 2023-10-19 DIAGNOSIS — J189 Pneumonia, unspecified organism: Secondary | ICD-10-CM | POA: Diagnosis not present

## 2023-10-19 DIAGNOSIS — R0902 Hypoxemia: Secondary | ICD-10-CM | POA: Diagnosis not present

## 2023-10-19 DIAGNOSIS — I499 Cardiac arrhythmia, unspecified: Secondary | ICD-10-CM | POA: Diagnosis not present

## 2023-10-19 DIAGNOSIS — N1831 Chronic kidney disease, stage 3a: Secondary | ICD-10-CM | POA: Diagnosis not present

## 2023-10-19 DIAGNOSIS — J09X9 Influenza due to identified novel influenza A virus with other manifestations: Secondary | ICD-10-CM | POA: Diagnosis not present

## 2023-10-19 DIAGNOSIS — E039 Hypothyroidism, unspecified: Secondary | ICD-10-CM | POA: Diagnosis not present

## 2023-10-19 DIAGNOSIS — E875 Hyperkalemia: Secondary | ICD-10-CM | POA: Diagnosis not present

## 2023-10-19 DIAGNOSIS — K529 Noninfective gastroenteritis and colitis, unspecified: Secondary | ICD-10-CM

## 2023-10-19 DIAGNOSIS — R6510 Systemic inflammatory response syndrome (SIRS) of non-infectious origin without acute organ dysfunction: Secondary | ICD-10-CM | POA: Diagnosis not present

## 2023-10-19 DIAGNOSIS — E1165 Type 2 diabetes mellitus with hyperglycemia: Secondary | ICD-10-CM | POA: Diagnosis not present

## 2023-10-19 DIAGNOSIS — E872 Acidosis, unspecified: Secondary | ICD-10-CM | POA: Diagnosis not present

## 2023-10-19 NOTE — ED Provider Notes (Signed)
 [facility] EMERGENCY DEPT  EMERGENCY DEPARTMENT ENCOUNTER      Pt Name: Brian Freeman  MRN: [id number]  Birthdate [birth year]  Date of evaluation: 10/19/2023  Provider: Armida SHAUNNA Moose, MD      HISTORY OF PRESENT ILLNESS      83 year old male with history of coronary artery disease, diabetes presenting to the ER for nausea and vomiting.  Patient has had multiple episodes of nausea and vomiting today along with fever.  Family reports that multiple of them have been sick with the exact same symptoms over the last few days.  Patient has not had anything to eat or drink today due to his symptoms and they are concerned about dehydration.  Patient denies chest or abdominal pain.              Nursing Notes were reviewed.    REVIEW OF SYSTEMS         Review of Systems   Constitutional:  Positive for fatigue and fever.   Respiratory:  Negative for shortness of breath.    Cardiovascular:  Negative for chest pain.   Gastrointestinal:  Positive for nausea and vomiting. Negative for abdominal pain.           PAST MEDICAL HISTORY     Past Medical History:   Diagnosis Date    CAD (coronary artery disease)     Diabetes (HCC)     Gout     Hypertension     Hypothyroid     Kidney stones     Pneumonia          SURGICAL HISTORY       Past Surgical History:   Procedure Laterality Date    CORONARY ANGIOPLASTY WITH STENT PLACEMENT      HERNIA REPAIR           CURRENT MEDICATIONS       Previous Medications    No medications on file       ALLERGIES     Aspirin     FAMILY HISTORY     No family history on file.       SOCIAL HISTORY       Social History     Socioeconomic History    Marital status: Married   Tobacco Use    Smoking status: Never   Substance and Sexual Activity    Alcohol use: No    Drug use: No     Social Determinants of Health     Financial Resource Strain: High Risk (08/18/2022)    Received from [facility]    Overall Financial Resource Strain (CARDIA)     Difficulty of Paying Living Expenses: Hard   Food Insecurity: No Food Insecurity  (06/11/2020)    Received from [facility]    Hunger Vital Sign     Worried About Running Out of Food in the Last Year: Never true     Ran Out of Food in the Last Year: Never true   Transportation Needs: No Transportation Needs (08/18/2022)    Received from [facility] - Transportation     Lack of Transportation (Medical): No     Lack of Transportation (Non-Medical): No         PHYSICAL EXAM       ED Triage Vitals [10/19/23 2006]   BP Systolic BP Percentile Diastolic BP Percentile Temp Temp Source Pulse Respirations SpO2   (!) 159/82 -- -- 100.4 F (38 C) Oral (!) 107 20 94 %  Height Weight - Scale         1.803 m (5' 11) 74.8 kg (165 lb)             Body mass index is 23.01 kg/m.    Physical Exam  Vitals and nursing note reviewed.   Constitutional:       Appearance: Normal appearance.      Comments: Elderly, pleasant, no acute distress   HENT:      Head: Normocephalic and atraumatic.      Mouth/Throat:      Mouth: Mucous membranes are dry.      Pharynx: Oropharynx is clear. No oropharyngeal exudate.   Eyes:      Extraocular Movements: Extraocular movements intact.      Pupils: Pupils are equal, round, and reactive to light.   Cardiovascular:      Rate and Rhythm: Normal rate and regular rhythm.   Pulmonary:      Effort: Pulmonary effort is normal.      Breath sounds: Normal breath sounds.   Abdominal:      General: Abdomen is flat.      Palpations: Abdomen is soft.      Tenderness: There is no abdominal tenderness.   Musculoskeletal:         General: No swelling or deformity. Normal range of motion.      Cervical back: Normal range of motion.   Skin:     General: Skin is warm and dry.      Findings: No rash.   Neurological:      General: No focal deficit present.      Mental Status: He is alert.      Cranial Nerves: No cranial nerve deficit.   Psychiatric:         Mood and Affect: Mood normal.             EMERGENCY DEPARTMENT COURSE and DIFFERENTIAL DIAGNOSIS/MDM:   Vitals:    Vitals:    10/19/23  2145 10/19/23 2158 10/19/23 2212 10/19/23 2215   BP: (!) 146/80 128/82  117/68   Pulse:  92 100 93   Resp:  23 22 22    Temp:       TempSrc:       SpO2:  93% 90% 90%   Weight:       Height:             Medical Decision Making  Patient is elderly, w/ features of sepsis.  May be viral in etiology, CT abdomen is okay    I discussed case w/ the on call attending who will admit the patient for further sepsis treatment    Amount and/or Complexity of Data Reviewed  Labs: ordered. Decision-making details documented in ED Course.  Radiology: ordered.  ECG/medicine tests: ordered.    Risk  OTC drugs.  Prescription drug management.  Decision regarding hospitalization.            REASSESSMENT     ED Course as of 10/19/23 2235   Wed Oct 19, 2023   2223 IMPRESSION:  No bowel obstruction, ileus or perforation. No intra-abdominal  abscess.      [BN]   2229 POC Lactic Acid(!!): 2.81 [BN]   2229 WBC(!): 26.3 [BN]   2230 Glucose(!): 249 [BN]   2230 Creatinine(!): 1.98 [BN]   2230 IV hydration, nausea medication, Tylenol  and fluids have been ordered.  Patient did receive an empiric dose of antibiotics for fever.  I have a strong suspicion  for gastroenteritis given the family sick contacts. [BN]      ED Course User Index  [BN] Maranda Armida SQUIBB, MD         CONSULTS:  None    PROCEDURES:     Procedures    Total critical care time (not including time spent performing separately reportable procedures): 32 minutes      (Please note that portions of this note were completed with a voice recognition program.  Efforts were made to edit the dictations but occasionally words are mis-transcribed.)    Armida SQUIBB Maranda, MD (electronically signed)  Emergency Attending Physician              Maranda Armida SQUIBB, MD  10/24/23 1134

## 2023-10-19 NOTE — H&P (Addendum)
 History and Physical    Date of Service:  10/19/2023  Primary Care Provider: No primary care provider on file.  Source of information: Patient, Family, External Medical Records    Chief Complaint: Fever, Vomiting, and Diarrhea      History of Presenting Illness:   Brian Freeman is a very pleasant 83 y.o. male with a past medical history of CAD, diabetes, HTN, H LD, hypothyroidism, who presents to the emergency room due to profound nausea vomiting and diarrhea.    Patient was brought in from home due to intractable nausea, vomiting, diarrhea and now fatigue.  He has multiple family members at home who have a GI bug, and similar symptoms.  He has become profoundly fatigued, and cannot take p.o. any longer.  They are concerned about dehydration.  On arrival patient was febrile, tachycardic, very ill-appearing.  Laboratory evaluation revealed a elevated lactic acid, leukocytosis, and a code sepsis was called.  He has received IV fluids and sepsis antibiotics.       REVIEW OF SYSTEMS:  A comprehensive review of systems was negative except for that written in the History of Present Illness.     Past Medical History:   Diagnosis Date    CAD (coronary artery disease)     Diabetes (HCC)     Gout     Hypertension     Hypothyroid     Kidney stones     Pneumonia       Past Surgical History:   Procedure Laterality Date    CORONARY ANGIOPLASTY WITH STENT PLACEMENT      HERNIA REPAIR       Prior to Admission medications    Not on File     Allergies   Allergen Reactions    Aspirin  Nausea Only      No family history on file.   Social History:  reports that he has never smoked. He does not have any smokeless tobacco history on file. He reports that he does not drink alcohol and does not use drugs.   Social Determinants of Health     Tobacco Use: Low Risk  (08/29/2023)    Received from [facility]    Patient History     Smoking Tobacco Use: Never     Smokeless Tobacco Use: Never     Passive Exposure: Never   Alcohol Use: Not on  file   Financial Resource Strain: High Risk (08/18/2022)    Received from [facility]    Overall Financial Resource Strain (CARDIA)     Difficulty of Paying Living Expenses: Hard   Food Insecurity: No Food Insecurity (06/11/2020)    Received from [facility]    Hunger Vital Sign     Worried About Running Out of Food in the Last Year: Never true     Ran Out of Food in the Last Year: Never true   Transportation Needs: No Transportation Needs (08/18/2022)    Received from [facility] - Transportation     Lack of Transportation (Medical): No     Lack of Transportation (Non-Medical): No   Physical Activity: Not on file   Stress: Not on file   Social Connections: Not on file   Intimate Partner Violence: Not on file   Depression: Not on file   Housing Stability: Not on file   Interpersonal Safety: Not on file   Utilities: Not on file        Medications were reconciled to the best of  my ability given all available resources at the time of admission. Route is PO if not otherwise noted.     Family and social history were personally reviewed, all pertinent and relevant details are outlined as above.    Objective:   BP (!) 120/56   Pulse (!) 102   Temp 100.4 F (38 C) (Oral)   Resp 23   Ht 1.803 m (5' 11)   Wt 74.8 kg (165 lb)   SpO2 90%   BMI 23.01 kg/m         PHYSICAL EXAM:   General: Alert x oriented x 3, awake, no acute distress, very ill-appearing  HEENT: PEERL, EOMI, moist mucus membranes  Neck: Supple, no JVD, no meningeal signs  Chest: Clear to auscultation bilaterally   CVS: RRR, S1 S2 heard, no murmurs/rubs/gallops  Abd: Soft, non-tender, non-distended, +bowel sounds   Ext: No clubbing, no cyanosis, no edema  Neuro/Psych: Pleasant mood and affect, CN 2-12 grossly intact  Cap refill: Brisk, less than 3 seconds  Pulses: 2+, symmetric in all extremities  Skin: Warm, dry, without rashes or lesions    Data Review:   I have independently reviewed and interpreted patient's lab and all other diagnostic  data    Abnormal Labs Reviewed   CBC WITH AUTO DIFFERENTIAL - Abnormal; Notable for the following components:       Result Value    WBC 26.3 (*)     Neutrophils % 84 (*)     Lymphocytes % 3 (*)     Immature Granulocytes % 1 (*)     Neutrophils Absolute 22.0 (*)     Monocytes Absolute 3.2 (*)     Immature Granulocytes Absolute 0.3 (*)     All other components within normal limits   COMPREHENSIVE METABOLIC PANEL - Abnormal; Notable for the following components:    Glucose 249 (*)     BUN 24 (*)     Creatinine 1.98 (*)     Est, Glom Filt Rate 33 (*)     Albumin/Globulin Ratio 0.9 (*)     All other components within normal limits   POC LACTIC ACID - Abnormal; Notable for the following components:    POC Lactic Acid 2.81 (*)     All other components within normal limits         IMAGING:   CT ABDOMEN PELVIS W IV CONTRAST Additional Contrast? None   Final Result   No bowel obstruction, ileus or perforation. No intra-abdominal   abscess.      Electronically signed by Krystal Grippe, MD           ECG/ECHO:  EKG: normal EKG, normal sinus rhythm.       Notes reviewed from all clinical/nonclinical/nursing services involved in patient's clinical care. Care coordination discussions were held with appropriate clinical/nonclinical/ nursing providers based on care coordination needs.     Assessment:   Given the patient's current clinical presentation, there is a high level of concern for decompensation if discharged from the emergency department. Complex decision making was performed, which includes reviewing the patient's available past medical records, laboratory results, and imaging studies.    Principal Problem:    Sepsis (HCC)  Resolved Problems:    * No resolved hospital problems. *      Plan:     Brian Freeman is a very pleasant 83 y.o. male with a past medical history of CAD, diabetes, HTN, H LD, hypothyroidism, who presents to the emergency room due to  profound nausea vomiting and diarrhea.    SIRS versus sepsis 2/2 acute  gastroenteritis  Lactic acidosis  Leukocytosis  Nausea vomiting diarrhea with p.o. intolerance  - No obvious findings on CT scan  - Likely viral  - Obtain viral panel  - Continue abdominal antibiotics until source identified  - Trend lactate  - Advance diet as tolerated  - Continue gentle IV fluids while not tolerating p.o.    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    Hypothyroidism, POA  --Obtain TSH thyroid  --continue levothyroxine     Chronic hypokalemia  - Previously seen by nephrology  - On Lokelma at baseline  - Holding Lokelma due to intractable nausea and vomiting    VA POLST COMPLETED: No    DIET: ADULT DIET; Regular   ISOLATION PRECAUTIONS: No active isolations  CODE STATUS: No Order   DVT PROPHYLAXIS: LMW/Heparin  EMERGENCY CONTACT/SURROGATE DECISION MAKER: Extended Emergency Contact Information  Primary Emergency Contact: Sharron,Nancy  Address: [address]           [place], [place] [address] [place]  Mobile Phone: [phone]  Relation: None  Secondary Emergency Contact: Hammes,Christina  Mobile Phone: [phone]  Relation: Child     CRITICAL CARE WAS PERFORMED FOR THIS ENCOUNTER: 0 minutes performed in critical care, excluding procedures      Signed By: Manuelita FORBES Epley, MD     October 19, 2023         Please note that this dictation may have been completed with [employer], the computer voice recognition software.  Quite often unanticipated grammatical, syntax, homophones, and other interpretive errors are inadvertently transcribed by the computer software.  Please disregard these errors.  Please excuse any errors that have escaped final proofreading.

## 2023-10-19 NOTE — ED Notes (Signed)
 Dr Delton See notified of patients elevated lactic acid. Pt to be moved into department once bed available.

## 2023-10-19 NOTE — ED Notes (Signed)
 Patient was brought to overflow, raised concern for fever, tachycardia, and wbc 26. Code sepsis called and placed protocol orders.

## 2023-10-19 NOTE — ED Triage Notes (Signed)
 Pt via wheelchair to ED with family for c/o fever, n/v/d since yesterday. States multiple family members are sick at home for same symptoms.  Pt is HoH.  Denies any OTC home remedies.

## 2023-10-20 ENCOUNTER — Inpatient Hospital Stay: Admit: 2023-10-20 | Payer: Medicare (Managed Care)

## 2023-10-20 ENCOUNTER — Inpatient Hospital Stay
Admission: EM | Admit: 2023-10-20 | Discharge: 2023-10-21 | Disposition: A | Discharge status: Home / Self Care | Payer: MEDICARE | Admitting: Internal Medicine

## 2023-10-20 DIAGNOSIS — A419 Sepsis, unspecified organism: Secondary | ICD-10-CM | POA: Diagnosis not present

## 2023-10-20 DIAGNOSIS — R0902 Hypoxemia: Secondary | ICD-10-CM | POA: Diagnosis not present

## 2023-10-20 DIAGNOSIS — J9601 Acute respiratory failure with hypoxia: Secondary | ICD-10-CM | POA: Diagnosis not present

## 2023-10-20 DIAGNOSIS — I499 Cardiac arrhythmia, unspecified: Secondary | ICD-10-CM | POA: Diagnosis not present

## 2023-10-20 DIAGNOSIS — N179 Acute kidney failure, unspecified: Secondary | ICD-10-CM | POA: Diagnosis not present

## 2023-10-20 DIAGNOSIS — R6520 Severe sepsis without septic shock: Secondary | ICD-10-CM | POA: Diagnosis not present

## 2023-10-20 DIAGNOSIS — E1165 Type 2 diabetes mellitus with hyperglycemia: Secondary | ICD-10-CM | POA: Diagnosis not present

## 2023-10-20 LAB — BASIC METABOLIC PANEL
Anion Gap: 7 mmol/L (ref 2–12)
BUN/Creatinine Ratio: 13 (ref 12–20)
BUN: 26 mg/dL — ABNORMAL HIGH (ref 6–20)
CO2: 22 mmol/L (ref 21–32)
Calcium: 7.7 mg/dL — ABNORMAL LOW (ref 8.5–10.1)
Chloride: 110 mmol/L — ABNORMAL HIGH (ref 97–108)
Creatinine: 2.04 mg/dL — ABNORMAL HIGH (ref 0.70–1.30)
Est, Glom Filt Rate: 32 mL/min/{1.73_m2} — ABNORMAL LOW (ref >60)
Glucose: 145 mg/dL — ABNORMAL HIGH (ref 65–100)
Potassium: 4.5 mmol/L (ref 3.5–5.1)
Sodium: 139 mmol/L (ref 136–145)

## 2023-10-20 LAB — RESPIRATORY PANEL, MOLECULAR, WITH COVID-19
Adenovirus by PCR: NOT DETECTED
Bordetella parapertussis by PCR: NOT DETECTED
Bordetella pertussis by PCR: NOT DETECTED
Chlamydophila Pneumonia PCR: NOT DETECTED
Coronavirus 229E by PCR: NOT DETECTED
Coronavirus HKU1 by PCR: NOT DETECTED
Coronavirus NL63 by PCR: NOT DETECTED
Coronavirus OC43 by PCR: NOT DETECTED
Human Metapneumovirus by PCR: NOT DETECTED
Influenza A H3 by PCR: DETECTED — AB
Influenza B PCR: NOT DETECTED
Mycoplasma pneumo by PCR: NOT DETECTED
Parainfluenza 1 PCR: NOT DETECTED
Parainfluenza 2 PCR: NOT DETECTED
Parainfluenza 3 PCR: NOT DETECTED
Parainfluenza 4 PCR: NOT DETECTED
Respiratory Syncytial Virus by PCR: NOT DETECTED
Rhinovirus Enterovirus PCR: NOT DETECTED
SARS-CoV-2, PCR: NOT DETECTED

## 2023-10-20 LAB — CBC WITH AUTO DIFFERENTIAL
Basophils %: 0 % (ref 0–1)
Basophils %: 0 % (ref 0–1)
Basophils Absolute: 0 10*3/uL (ref 0.0–0.1)
Basophils Absolute: 0 10*3/uL (ref 0.0–0.1)
Eosinophils %: 0 % (ref 0–7)
Eosinophils %: 0 % (ref 0–7)
Eosinophils Absolute: 0 10*3/uL (ref 0.0–0.4)
Eosinophils Absolute: 0 10*3/uL (ref 0.0–0.4)
Hematocrit: 45.3 % (ref 36.6–50.3)
Hematocrit: 41.1 % (ref 36.6–50.3)
Hemoglobin: 15.4 g/dL (ref 12.1–17.0)
Hemoglobin: 13.7 g/dL (ref 12.1–17.0)
Immature Granulocytes %: 1 % — ABNORMAL HIGH (ref 0.0–0.5)
Immature Granulocytes %: 1 % — ABNORMAL HIGH (ref 0.0–0.5)
Immature Granulocytes Absolute: 0.3 10*3/uL — ABNORMAL HIGH (ref 0.00–0.04)
Immature Granulocytes Absolute: 0.3 10*3/uL — ABNORMAL HIGH (ref 0.00–0.04)
Lymphocytes %: 3 % — ABNORMAL LOW (ref 12–49)
Lymphocytes %: 8 % — ABNORMAL LOW (ref 12–49)
Lymphocytes Absolute: 0.8 10*3/uL (ref 0.8–3.5)
Lymphocytes Absolute: 2.2 10*3/uL (ref 0.8–3.5)
MCH: 29.5 pg (ref 26.0–34.0)
MCH: 29.7 pg (ref 26.0–34.0)
MCHC: 34 g/dL (ref 30.0–36.5)
MCHC: 33.3 g/dL (ref 30.0–36.5)
MCV: 86.8 FL (ref 80.0–99.0)
MCV: 89.2 FL (ref 80.0–99.0)
MPV: 10.4 FL (ref 8.9–12.9)
MPV: 10.7 FL (ref 8.9–12.9)
Monocytes %: 12 % (ref 5–13)
Monocytes %: 9 % (ref 5–13)
Monocytes Absolute: 3.2 10*3/uL — ABNORMAL HIGH (ref 0.0–1.0)
Monocytes Absolute: 2.5 10*3/uL — ABNORMAL HIGH (ref 0.0–1.0)
Neutrophils %: 84 % — ABNORMAL HIGH (ref 32–75)
Neutrophils %: 82 % — ABNORMAL HIGH (ref 32–75)
Neutrophils Absolute: 22 10*3/uL — ABNORMAL HIGH (ref 1.8–8.0)
Neutrophils Absolute: 23 10*3/uL — ABNORMAL HIGH (ref 1.8–8.0)
Nucleated RBCs: 0 /100{WBCs}
Nucleated RBCs: 0 /100{WBCs}
Platelets: 317 10*3/uL (ref 150–400)
Platelets: 267 10*3/uL (ref 150–400)
RBC: 5.22 M/uL (ref 4.10–5.70)
RBC: 4.61 M/uL (ref 4.10–5.70)
RDW: 14.5 % (ref 11.5–14.5)
RDW: 14.9 % — ABNORMAL HIGH (ref 11.5–14.5)
WBC: 26.3 10*3/uL — ABNORMAL HIGH (ref 4.1–11.1)
WBC: 28 10*3/uL — ABNORMAL HIGH (ref 4.1–11.1)
nRBC: 0 10*3/uL (ref 0.00–0.01)
nRBC: 0 10*3/uL (ref 0.00–0.01)

## 2023-10-20 LAB — LIPID PANEL
Chol/HDL Ratio: 1.8 (ref 0.0–5.0)
Cholesterol, Total: 84 mg/dL (ref <200)
HDL: 47 mg/dL
LDL Cholesterol: 26.8 mg/dL (ref 0–100)
Triglycerides: 51 mg/dL (ref <150)
VLDL Cholesterol Calculated: 10.2 mg/dL

## 2023-10-20 LAB — URINALYSIS WITH REFLEX TO CULTURE
BACTERIA, URINE: NEGATIVE /HPF
Bilirubin, Urine: NEGATIVE
Blood, Urine: NEGATIVE
Glucose, Ur: NEGATIVE mg/dL
Ketones, Urine: NEGATIVE mg/dL
Leukocyte Esterase, Urine: NEGATIVE
Nitrite, Urine: NEGATIVE
Protein, UA: 30 mg/dL — AB
Specific Gravity, UA: 1.015 (ref 1.003–1.030)
Urobilinogen, Urine: 0.2 EU/dL (ref 0.2–1.0)
pH, Urine: 5 (ref 5.0–8.0)

## 2023-10-20 LAB — COMPREHENSIVE METABOLIC PANEL
ALT: 15 U/L (ref 12–78)
AST: 20 U/L (ref 15–37)
Albumin/Globulin Ratio: 0.9 — ABNORMAL LOW (ref 1.1–2.2)
Albumin: 3.5 g/dL (ref 3.5–5.0)
Alk Phosphatase: 74 U/L (ref 45–117)
Anion Gap: 11 mmol/L (ref 2–12)
BUN/Creatinine Ratio: 12 (ref 12–20)
BUN: 24 mg/dL — ABNORMAL HIGH (ref 6–20)
CO2: 22 mmol/L (ref 21–32)
Calcium: 9.2 mg/dL (ref 8.5–10.1)
Chloride: 104 mmol/L (ref 97–108)
Creatinine: 1.98 mg/dL — ABNORMAL HIGH (ref 0.70–1.30)
Est, Glom Filt Rate: 33 mL/min/{1.73_m2} — ABNORMAL LOW (ref >60)
Globulin: 3.9 g/dL (ref 2.0–4.0)
Glucose: 249 mg/dL — ABNORMAL HIGH (ref 65–100)
Potassium: 4.7 mmol/L (ref 3.5–5.1)
Sodium: 137 mmol/L (ref 136–145)
Total Bilirubin: 0.6 mg/dL (ref 0.2–1.0)
Total Protein: 7.4 g/dL (ref 6.4–8.2)

## 2023-10-20 LAB — COVID-19 & INFLUENZA COMBO
Rapid Influenza A By PCR: DETECTED — AB
Rapid Influenza B By PCR: NOT DETECTED
SARS-CoV-2, PCR: NOT DETECTED

## 2023-10-20 LAB — EKG 12-LEAD
Atrial Rate: 308 {beats}/min
Q-T Interval: 386 ms
QRS Duration: 128 ms
QTc Calculation (Bazett): 487 ms
R Axis: -41 degrees
T Axis: -10 degrees
Ventricular Rate: 96 {beats}/min

## 2023-10-20 LAB — SODIUM, URINE, RANDOM: SODIUM, RANDOM URINE: 34 MMOL/L

## 2023-10-20 LAB — POCT GLUCOSE
POC Glucose: 161 mg/dL — ABNORMAL HIGH (ref 65–117)
POC Glucose: 135 mg/dL — ABNORMAL HIGH (ref 65–117)
POC Glucose: 140 mg/dL — ABNORMAL HIGH (ref 65–117)
POC Glucose: 88 mg/dL (ref 65–117)

## 2023-10-20 LAB — POC LACTIC ACID: POC Lactic Acid: 2.81 mmol/L (ref 0.40–2.00)

## 2023-10-20 LAB — CREATININE, RANDOM URINE: Creatinine, Ur: 115 mg/dL

## 2023-10-20 LAB — TSH + FREE T4 PANEL
T4 Free: 1.4 ng/dL (ref 0.8–1.5)
TSH, 3rd Generation: 0.25 u[IU]/mL — ABNORMAL LOW (ref 0.36–3.74)

## 2023-10-20 LAB — PROCALCITONIN: Procalcitonin: 7.19 ng/mL

## 2023-10-20 LAB — LACTIC ACID: Lactic Acid, Plasma: 2 MMOL/L (ref 0.4–2.0)

## 2023-10-20 LAB — HEMOGLOBIN A1C
Estimated Avg Glucose: 123 mg/dL
Hemoglobin A1C: 5.9 % — ABNORMAL HIGH (ref 4.0–5.6)

## 2023-10-20 LAB — UREA NITROGEN, URINE: Urea Nitrogen, Random Urine: 608 mg/dL

## 2023-10-20 LAB — PHOSPHORUS: Phosphorus: 3.8 mg/dL (ref 2.6–4.7)

## 2023-10-20 MED ORDER — METOPROLOL TARTRATE 25 MG PO TABS
25 | Freq: Two times a day (BID) | ORAL | Status: DC
Start: 2023-10-20 — End: 2023-10-21
  Administered 2023-10-20 – 2023-10-21 (×3): 25 mg via ORAL

## 2023-10-20 MED ORDER — GLUCAGON (RDNA) 1 MG IJ KIT
1 | INTRAMUSCULAR | Status: DC | PRN
Start: 2023-10-20 — End: 2023-10-21

## 2023-10-20 MED ORDER — STERILE WATER FOR INJECTION (MIXTURES ONLY)
2 | INTRAMUSCULAR | Status: AC
Start: 2023-10-20 — End: 2023-10-19
  Administered 2023-10-20: 03:00:00 2000 mg via INTRAVENOUS

## 2023-10-20 MED ORDER — SODIUM BICARBONATE 650 MG PO TABS
650 | Freq: Two times a day (BID) | ORAL | Status: DC
Start: 2023-10-20 — End: 2023-10-21
  Administered 2023-10-20 – 2023-10-21 (×4): 650 mg via ORAL

## 2023-10-20 MED ORDER — FINASTERIDE 5 MG PO TABS
5 | Freq: Every day | ORAL | Status: DC
Start: 2023-10-20 — End: 2023-10-21
  Administered 2023-10-20 – 2023-10-21 (×2): 5 mg via ORAL

## 2023-10-20 MED ORDER — ENOXAPARIN SODIUM 30 MG/0.3ML IJ SOSY
30 | Freq: Every day | INTRAMUSCULAR | Status: DC
Start: 2023-10-20 — End: 2023-10-21
  Administered 2023-10-20 – 2023-10-21 (×2): 30 mg via SUBCUTANEOUS

## 2023-10-20 MED ORDER — DEXTROSE 10 % IV SOLN
10 | INTRAVENOUS | Status: DC | PRN
Start: 2023-10-20 — End: 2023-10-21

## 2023-10-20 MED ORDER — CEFTRIAXONE SODIUM 2 G IJ SOLR
2 | INTRAMUSCULAR | Status: DC
Start: 2023-10-20 — End: 2023-10-21
  Administered 2023-10-21: 03:00:00 2000 mg via INTRAVENOUS

## 2023-10-20 MED ORDER — ONDANSETRON HCL 4 MG/2ML IJ SOLN
4 | INTRAMUSCULAR | Status: AC
Start: 2023-10-20 — End: 2023-10-20

## 2023-10-20 MED ORDER — GABAPENTIN 100 MG PO CAPS
100 | Freq: Two times a day (BID) | ORAL | Status: DC
Start: 2023-10-20 — End: 2023-10-21
  Administered 2023-10-20 – 2023-10-21 (×4): 100 mg via ORAL

## 2023-10-20 MED ORDER — NORMAL SALINE FLUSH 0.9 % IV SOLN
0.9 | Freq: Two times a day (BID) | INTRAVENOUS | Status: DC
Start: 2023-10-20 — End: 2023-10-21
  Administered 2023-10-20 – 2023-10-21 (×4): 10 mL via INTRAVENOUS

## 2023-10-20 MED ORDER — DEXTROSE 10 % IV BOLUS
INTRAVENOUS | Status: DC | PRN
Start: 2023-10-20 — End: 2023-10-21

## 2023-10-20 MED ORDER — MELATONIN 3 MG PO TABS
3 | Freq: Every evening | ORAL | Status: DC | PRN
Start: 2023-10-20 — End: 2023-10-21
  Administered 2023-10-20: 07:00:00 3 mg via ORAL

## 2023-10-20 MED ORDER — ACETAMINOPHEN 650 MG RE SUPP
650 | Freq: Four times a day (QID) | RECTAL | Status: DC | PRN
Start: 2023-10-20 — End: 2023-10-21

## 2023-10-20 MED ORDER — SODIUM CHLORIDE 0.9 % IV BOLUS
0.9 | Freq: Once | INTRAVENOUS | Status: AC
Start: 2023-10-20 — End: 2023-10-19
  Administered 2023-10-20: 03:00:00 2250 mL/kg via INTRAVENOUS

## 2023-10-20 MED ORDER — ACETAMINOPHEN 325 MG PO TABS
325 | Freq: Four times a day (QID) | ORAL | Status: DC | PRN
Start: 2023-10-20 — End: 2023-10-21

## 2023-10-20 MED ORDER — ONDANSETRON HCL 4 MG/2ML IJ SOLN
4 | Freq: Four times a day (QID) | INTRAMUSCULAR | Status: DC | PRN
Start: 2023-10-20 — End: 2023-10-21

## 2023-10-20 MED ORDER — GLUCOSE 4 G PO CHEW
4 | ORAL | Status: DC | PRN
Start: 2023-10-20 — End: 2023-10-21

## 2023-10-20 MED ORDER — SODIUM CHLORIDE 0.9 % IV SOLN
0.9 | INTRAVENOUS | Status: AC
Start: 2023-10-20 — End: 2023-10-20
  Administered 2023-10-20: 07:00:00 via INTRAVENOUS

## 2023-10-20 MED ORDER — IOPAMIDOL 76 % IV SOLN
76 | Freq: Once | INTRAVENOUS | Status: AC | PRN
Start: 2023-10-20 — End: 2023-10-19
  Administered 2023-10-20: 02:00:00 100 mL via INTRAVENOUS

## 2023-10-20 MED ORDER — ONDANSETRON 4 MG PO TBDP
4 | Freq: Three times a day (TID) | ORAL | Status: DC | PRN
Start: 2023-10-20 — End: 2023-10-21

## 2023-10-20 MED ORDER — ONDANSETRON HCL 4 MG/2ML IJ SOLN
4 | Freq: Once | INTRAMUSCULAR | Status: AC
Start: 2023-10-20 — End: 2023-10-19
  Administered 2023-10-20: 03:00:00 4 mg via INTRAVENOUS

## 2023-10-20 MED ORDER — ASPIRIN 81 MG PO TBEC
81 | Freq: Every day | ORAL | Status: DC
Start: 2023-10-20 — End: 2023-10-21
  Administered 2023-10-20: 13:00:00 81 mg via ORAL

## 2023-10-20 MED ORDER — OSELTAMIVIR PHOSPHATE 30 MG PO CAPS
30 | Freq: Every day | ORAL | Status: DC
Start: 2023-10-20 — End: 2023-10-21
  Administered 2023-10-20 – 2023-10-21 (×2): 30 mg via ORAL

## 2023-10-20 MED ORDER — METRONIDAZOLE 500 MG/100ML IV SOLN
500 | Freq: Three times a day (TID) | INTRAVENOUS | Status: DC
Start: 2023-10-20 — End: 2023-10-20
  Administered 2023-10-20 (×2): 500 mg via INTRAVENOUS

## 2023-10-20 MED ORDER — SODIUM CHLORIDE 0.9 % IV SOLN
0.9 | INTRAVENOUS | Status: DC | PRN
Start: 2023-10-20 — End: 2023-10-21

## 2023-10-20 MED ORDER — LEVOTHYROXINE SODIUM 100 MCG PO TABS
100 | Freq: Every day | ORAL | Status: DC
Start: 2023-10-20 — End: 2023-10-21
  Administered 2023-10-20 – 2023-10-21 (×2): 100 ug via ORAL

## 2023-10-20 MED ORDER — LEVOTHYROXINE SODIUM 125 MCG PO TABS
125 | Freq: Every day | ORAL | Status: DC
Start: 2023-10-20 — End: 2023-10-19

## 2023-10-20 MED ORDER — INSULIN LISPRO 100 UNIT/ML IJ SOLN
100 | Freq: Four times a day (QID) | INTRAMUSCULAR | Status: DC
Start: 2023-10-20 — End: 2023-10-21

## 2023-10-20 MED ORDER — SODIUM CHLORIDE 0.9 % IV SOLN
0.9 % | INTRAVENOUS | Status: DC
Start: 2023-10-20 — End: 2023-10-21
  Administered 2023-10-20 – 2023-10-21 (×2): 500 mg via INTRAVENOUS

## 2023-10-20 MED ORDER — ACETAMINOPHEN 325 MG PO TABS
325 | ORAL | Status: AC
Start: 2023-10-20 — End: 2023-10-19
  Administered 2023-10-20: 03:00:00 650 mg via ORAL

## 2023-10-20 MED ORDER — NORMAL SALINE FLUSH 0.9 % IV SOLN
0.9 | INTRAVENOUS | Status: DC | PRN
Start: 2023-10-20 — End: 2023-10-21

## 2023-10-20 MED ORDER — POLYETHYLENE GLYCOL 3350 17 G PO PACK
17 | Freq: Every day | ORAL | Status: DC | PRN
Start: 2023-10-20 — End: 2023-10-21

## 2023-10-20 MED FILL — SODIUM CHLORIDE 0.9 % IV SOLN: 0.9 % | INTRAVENOUS | Qty: 3000

## 2023-10-20 MED FILL — ASPIRIN LOW DOSE 81 MG PO TBEC: 81 MG | ORAL | Qty: 1

## 2023-10-20 MED FILL — ISOVUE-370 76 % IV SOLN: 76 % | INTRAVENOUS | Qty: 100

## 2023-10-20 MED FILL — ACETAMINOPHEN 325 MG PO TABS: 325 MG | ORAL | Qty: 2

## 2023-10-20 MED FILL — SODIUM BICARBONATE 650 MG PO TABS: 650 MG | ORAL | Qty: 1

## 2023-10-20 MED FILL — BD POSIFLUSH 0.9 % IV SOLN: 0.9 % | INTRAVENOUS | Qty: 40

## 2023-10-20 MED FILL — METRONIDAZOLE 500 MG/100ML IV SOLN: 500 MG/100ML | INTRAVENOUS | Qty: 100

## 2023-10-20 MED FILL — ONDANSETRON HCL 4 MG/2ML IJ SOLN: 4 MG/2ML | INTRAMUSCULAR | Qty: 2

## 2023-10-20 MED FILL — FINASTERIDE 5 MG PO TABS: 5 MG | ORAL | Qty: 1

## 2023-10-20 MED FILL — MELATONIN 3 MG PO TABS: 3 MG | ORAL | Qty: 1

## 2023-10-20 MED FILL — GABAPENTIN 100 MG PO CAPS: 100 MG | ORAL | Qty: 1

## 2023-10-20 MED FILL — METOPROLOL TARTRATE 25 MG PO TABS: 25 MG | ORAL | Qty: 1

## 2023-10-20 MED FILL — ENOXAPARIN SODIUM 30 MG/0.3ML IJ SOSY: 30 MG/0.3ML | INTRAMUSCULAR | Qty: 0.3

## 2023-10-20 MED FILL — LEVOTHYROXINE SODIUM 100 MCG PO TABS: 100 MCG | ORAL | Qty: 1

## 2023-10-20 MED FILL — DEXTROSE 10 % IV SOLN: 10 % | INTRAVENOUS | Qty: 500

## 2023-10-20 MED FILL — CEFTRIAXONE SODIUM 2 G IJ SOLR: 2 g | INTRAMUSCULAR | Qty: 2000

## 2023-10-20 MED FILL — SODIUM CHLORIDE 0.9 % IV SOLN: 0.9 % | INTRAVENOUS | Qty: 1000

## 2023-10-20 MED FILL — AZITHROMYCIN 500 MG IV SOLR: 500 MG | INTRAVENOUS | Qty: 500

## 2023-10-20 MED FILL — OSELTAMIVIR PHOSPHATE 30 MG PO CAPS: 30 MG | ORAL | Qty: 1

## 2023-10-20 NOTE — Progress Notes (Signed)
 PHYSICAL THERAPY EVALUATION/DISCHARGE    Patient: Brian Freeman (83 y.o. male)  Date: 10/20/2023  Primary Diagnosis: Sepsis (HCC) [A41.9]       Precautions: Fall Risk                      ASSESSMENT AND RECOMMENDATIONS:  Based on the objective data below, the patient presented with nausea and vomiting and found to be septic secondary to Flu A +.  Patient with a history of DM, CAD,HTN.  Patient normally is ambulatory and active (walks his daughters dog 2 miles a day). Today, distance limited due to being in ED, but able to ambulate short distance without an assistive device and no loss of balance.  Vitals stable- see below.  He is on room air.     Functional Outcome Measure:  The patient scored 22/24 on the [facility] outcome measure.          Further skilled acute physical therapy is not indicated at this time.       PLAN :  Recommendation for discharge: (in order for the patient to meet his/her long term goals):   No skilled physical therapy    Other factors to consider for discharge: no additional factors    IF patient discharges home will need the following DME: none       SUBJECTIVE:   Patient stated "[place]  patient and wife resides with his daughter (here in [place]) from Nov-Jan.  Plan to return to [place] later in January    OBJECTIVE DATA SUMMARY:     Past Medical History:   Diagnosis Date    CAD (coronary artery disease)     Diabetes (HCC)     Gout     Hypertension     Hypothyroid     Kidney stones     Pneumonia      Past Surgical History:   Procedure Laterality Date    CORONARY ANGIOPLASTY WITH STENT PLACEMENT      HERNIA REPAIR         Home Situation and Prior Level of Function: see above  Social/Functional History  Lives With: Spouse, Daughter  Type of Home:  (info is for his daughter's house)  Home Layout: Two level, Bed/Bath upstairs  Home Access: Stairs to enter with rails  Entrance Stairs - Number of Steps: 4  Entrance Stairs - Rails: Both  Bathroom Shower/Tub:  Pension Scheme Manager: Standard  Home Equipment: None  Has the patient had two or more falls in the past year or any fall with injury in the past year?: No (near falls with acute weakness)  Prior Level of Assist for ADLs: Independent  Critical Behavior:  Orientation  Overall Orientation Status: Within Normal Limits  Orientation Level: Oriented X4  Cognition  Overall Cognitive Status: WNL    Patient Vitals for the past 6 hrs:   Pulse BP Patient Position   10/20/23 1206 -- 112/64 Standing   10/20/23 1159 85 109/73 Semi fowlers   10/20/23 0754 86 131/82 Supine             Strength:    Strength: Within functional limits    Tone & Sensation:   Tone: Normal  Sensation: Intact    Coordination:  Coordination: Within functional limits    Range Of Motion:  AROM: Within functional limits  PROM: Within functional limits    Functional Mobility:  Bed Mobility:  Bed Mobility Training  Bed Mobility Training: Yes  Supine to Sit: Supervision  Sit to Supine: Supervision  Transfers:     Transfer Training  Transfer Training: Yes  Sit to Stand: Supervision  Stand to Sit: Supervision  Balance:               Balance  Sitting: Intact  Standing: Impaired  Standing - Static: Good;Fair  Standing - Dynamic: Fair  Ambulation/Gait Training:                       Gait  Gait Training: Yes  Overall Level of Assistance: Supervision  Distance (ft): 25 Feet  Assistive Device: Gait belt                                                                                                                                                                                                                                                                [employer] AM-PAC      Basic Mobility Inpatient Short Form (6-Clicks) Version 2    How much help is needed turning from your back to your side while in a flat bed without using bedrails?: None  How much help is needed moving from lying on your back to sitting on the side of a flat bed without using  bedrails?: None  How much help is needed moving to and from a bed to a chair?: None  How much help is needed standing up from a chair using your arms?: None  How much help is needed walking in hospital room?: A Little  How much help is needed climbing 3-5 steps with a railing?: A Little    AM-PAC Inpatient Mobility Raw Score : 22  AM-PAC Inpatient T-Scale Score : 53.28     Cutoff score <=171,2,3 had higher odds of discharging home with home health or need of SNF/IPR.    1. Diane U. Jette, Ronal Broody, Vinoth K. Ranganathan, Sandra D. Passek, Gilmore RAMAN. Waldemar Dale EMERSON Aneta.  Validity of the AM-PAC "6-Clicks" Inpatient Daily Activity and Basic Mobility Short Forms. Physical Therapy Mar 2014, 94 (3) 379-391; DOI: 10.2522/ptj.20130199  2. Warren M, Knecht J, Verheijde J, Tompkins J. Association of AM-PAC 6-Clicks Basic Mobility and Daily Activity Scores With Discharge  Destination. Phys Ther. 2021 Apr 4;101(4):pzab043. doi: 10.1093/ptj/pzab043. PMID: 66482536.  3. Herbold J, Rajaraman D, Waddell RAMAN, Agayby K, Callaghan S. Activity Measure for Post-Acute Care 6-Clicks Basic Mobility Scores Predict Discharge Destination After Acute Care Hospitalization in Select Patient Groups: A Retrospective, Observational Study. Arch Rehabil Res Clin Transl. 2022 Jul 16;4(3):100204. doi: 10.1016/j.arrct.7977.899795. PMID: 63876017; PMCID: EFR0517973.  4. Aneta DELENA Darryle RAMAN, Coster W, Ni P. AM-PAC Short Forms Manual 4.0. Revised 11/2018.                                                                                                                                                                                                                               Pain Rating:  None reported    Activity Tolerance:   Fair     After treatment:   On ED stretcher in ED      COMMUNICATION/EDUCATION:   The patient's plan of care was discussed with: registered nurse    Patient Education  Education Given To: Patient  Education Provided: Plan of  Care  Education Method: Verbal  Barriers to Learning: None  Education Outcome: Verbalized understanding    Thank you for this referral.  Asberry Luck PT,DPT,NCS,CLT  Minutes: 17      Physical Therapy Evaluation Charge Determination   History Examination Presentation Decision-Making   MEDIUM  Complexity : 1-2 comorbidities / personal factors will impact the outcome/ POC  MEDIUM Complexity : 3 Standardized tests and measures addressin body structure, function, activity limitation and / or participation in recreation  MEDIUM Complexity : Evolving with changing characteristics  AM-PAC  MEDIUM     Based on the above components, the patient evaluation is determined to be of the following complexity level: Medium

## 2023-10-20 NOTE — Progress Notes (Signed)
 Hospitalist Progress Note      NAME:  Brian Freeman   DOB:  Dec 12, 1940  MRM:  [id number]    Date/Time: 10/20/2023  7:24 AM           Assessment / Plan:     Brian Freeman is a very pleasant 83 y.o. male with a past medical history of CAD, diabetes, HTN, H LD, hypothyroidism, who presents to the emergency room due to profound nausea vomiting and diarrhea.    #Severe Sepsis: 4/4 SIRS with lactate 2.8 on arrival. Procal is 7.2. Flu A positive, but possible bacterial infection present as well given pulm infiltrates on my read. S/p 30cc/kg bolus w/ MAP at goal   - Cont Empiric ctx, add azith given pulm infiltrates   - Start oseltamivir    - Continue to monitor fluid responsiveness   - UOP and MAPs to goal    #AHRF: Currently requiring 2L nc. CXR with diffuse infiltrates per my read. 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 Likely pre-renal in s/o above   - IVF   - Renally dose meds, avoid nephrotoxins    #Diabetes with hyperglycemia, POA   - Hold home meds  - Start SSI     #Hypertension   - BP higher so ok to resume metop     #Hyperlipidemia  - Hold statin until tolerating p.o.     #Hypothyroidism, POA  --Obtain TSH thyroid  --continue levothyroxine      Chronic hyperkalemia  - Previously seen by nephrology  - On Lokelma at baseline  - Holding Lokelma due to intractable nausea and vomiting          #BMI (Calculated): 23.02    I have personally reviewed the radiographs, laboratory data in Epic and decisions and statements above are based partially on this personal interpretation.                 Care Plan discussed with: Patient, Care Manager, and Nursing Staff    Discussed:  Care Plan    Prophylaxis:  Lovenox     Disposition:  Home w/Family           ___________________________________________________    Attending  Physician: Prentice Ash, MD        Subjective:     Chief Complaint:  Feels better today and hasn't had any more diarrhea. Tolerated some apple juice. Does endorse cough, no sob    ROS:  (bold if positive, if negative)              Objective:       Vitals:          Last 24hrs VS reviewed since prior progress note. Most recent are:    Vitals:    10/20/23 0654   BP: 134/83   Pulse: 81   Resp: 17   Temp: 98.7 F (37.1 C)   SpO2: 93%     SpO2 Readings from Last 6 Encounters:   10/20/23 93%          Intake/Output Summary (Last 24 hours) at 10/20/2023 0724  Last data filed at 10/20/2023 0610  Gross per 24 hour   Intake 185.5 ml   Output 520 ml  Net -334.5 ml          Exam:     Physical Exam:    Gen:  Well-developed, well-nourished, in no acute distress  HEENT:  Pink conjunctivae, PERRL, hearing intact to voice, moist mucous membranes  Neck:  Supple, without masses, thyroid non-tender  Resp:  No accessory muscle use, diffuse ronchi  Card:  No murmurs, normal S1, S2 without thrills, bruits or peripheral edema  Abd:  Soft, non-tender, non-distended, normoactive bowel sounds are present  Musc:  No cyanosis or clubbing  Skin:  No rashes or ulcers, skin turgor is good  Neuro:  Cranial nerves 3-12 are grossly intact, grip strength is 5/5 bilaterally and dorsi / plantarflexion is 5/5 bilaterally, follows commands appropriately  Psych:  Good insight, oriented to person, place and time, alert       Medications Reviewed: (see below)    Lab Data Reviewed: (see below)    ______________________________________________________________________    Medications:     Current Facility-Administered Medications   Medication Dose Route Frequency    0.9 % sodium chloride  infusion   IntraVENous Continuous    cefTRIAXone  (ROCEPHIN ) 2,000 mg in sterile water  20 mL IV syringe  2,000 mg IntraVENous Q24H    metroNIDAZOLE  (FLAGYL ) 500 mg in 0.9% NaCl 100 mL IVPB premix  500 mg IntraVENous Q8H    sodium chloride  flush 0.9 % injection 5-40 mL  5-40 mL  IntraVENous 2 times per day    sodium chloride  flush 0.9 % injection 5-40 mL  5-40 mL IntraVENous PRN    0.9 % sodium chloride  infusion   IntraVENous PRN    enoxaparin  Sodium (LOVENOX ) injection 30 mg  30 mg SubCUTAneous Daily    ondansetron  (ZOFRAN -ODT) disintegrating tablet 4 mg  4 mg Oral Q8H PRN    Or    ondansetron  (ZOFRAN ) injection 4 mg  4 mg IntraVENous Q6H PRN    melatonin tablet 3 mg  3 mg Oral Nightly PRN    polyethylene glycol (GLYCOLAX ) packet 17 g  17 g Oral Daily PRN    acetaminophen  (TYLENOL ) tablet 650 mg  650 mg Oral Q6H PRN    Or    acetaminophen  (TYLENOL ) suppository 650 mg  650 mg Rectal Q6H PRN    glucose chewable tablet 16 g  4 tablet Oral PRN    dextrose  bolus 10% 125 mL  125 mL IntraVENous PRN    Or    dextrose  bolus 10% 250 mL  250 mL IntraVENous PRN    glucagon  injection 1 mg  1 mg SubCUTAneous PRN    dextrose  10 % infusion   IntraVENous Continuous PRN    insulin  lispro (HUMALOG ,ADMELOG ) injection vial 0-4 Units  0-4 Units SubCUTAneous 4 times per day    aspirin  EC tablet 81 mg  81 mg Oral Daily    finasteride  (PROSCAR ) tablet 5 mg  5 mg Oral Daily    gabapentin  (NEURONTIN ) capsule 100 mg  100 mg Oral BID    metoprolol  tartrate (LOPRESSOR ) tablet 25 mg  25 mg Oral BID    sodium bicarbonate  tablet 650 mg  650 mg Oral BID    levothyroxine  (SYNTHROID ) tablet 100 mcg  100 mcg Oral QAM AC     Current Outpatient Medications   Medication Sig    aspirin  81 MG EC tablet Take 1 tablet by mouth daily    allopurinol (ZYLOPRIM) 100 MG tablet Take 1 tablet by mouth daily    bethanechol (URECHOLINE) 10 MG tablet Take 1 tablet by mouth  2 times daily    vitamin D (VITAMIN D-1000 MAX ST) 25 MCG (1000 UT) TABS tablet Take 2 tablets by mouth daily    finasteride  (PROSCAR ) 5 MG tablet Take 1 tablet by mouth daily    gabapentin  (NEURONTIN ) 100 MG capsule Take 1 capsule by mouth in the morning and at bedtime.    levothyroxine  (SYNTHROID ) 175 MCG tablet Take 100 mcg by mouth daily    metFORMIN (GLUCOPHAGE) 500 MG  tablet Take 1 tablet by mouth 2 times daily    metoprolol  tartrate (LOPRESSOR ) 25 MG tablet Take 1 tablet by mouth 2 times daily    simvastatin (ZOCOR) 20 MG tablet Take 1 tablet by mouth daily    sodium bicarbonate  650 MG tablet Take 1 tablet by mouth 2 times daily    sodium zirconium cyclosilicate (LOKELMA) 10 g PACK oral suspension Take 1 packet by mouth daily            Lab Review:     Recent Labs     10/19/23  2016 10/20/23  0611   WBC 26.3* 28.0*   HGB 15.4 13.7   HCT 45.3 41.1   PLT 317 267     Recent Labs     10/19/23  2016 10/20/23  0611   NA 137 139   K 4.7 4.5   CL 104 110*   CO2 22 22   BUN 24* 26*   PHOS  --  3.8   ALT 15  --      No components found for: [facility]

## 2023-10-20 NOTE — Plan of Care (Addendum)
 Problem: Occupational Therapy - Adult  Goal: By Discharge: Performs self-care activities at highest level of function for planned discharge setting.  See evaluation for individualized goals.  Description: FUNCTIONAL STATUS PRIOR TO ADMISSION:  Patient was independent with ADLs, IADLs, and functional mobility/ ambulatory without AD.  ON room air. No recent falls.  Walking his dog 2 miles/ day.    HOME SUPPORT: Patient resided at home with his wife, who uses a walker vs wheelchair, at a [place] in [place] .  They are staying locally with their daughter for the holidays.    Occupational Therapy Goals:  Initiated 10/20/2023  1.  Patient will perform grooming standing at sink with Modified Independence within 7 day(s).  2.  Patient will perform bathing with Modified Independence within 7 day(s).  3.  Patient will perform lower body dressing with Modified Independence within 7 day(s).  4.  Patient will perform toilet transfers with Modified Independence  within 7 day(s).  5.  Patient will perform all aspects of toileting with Modified Independence within 7 day(s).  6.  Patient will utilize fall prevention techniques during functional activities without cuing within 7 day(s).  7.  Patient will utilize energy conservation techniques during functional activities without cuing within 7 day(s).    Outcome: Progressing     OCCUPATIONAL THERAPY EVALUATION/ DISCHARGE    Patient: Brian Freeman (83 y.o. male)  Date: 10/20/2023  Primary Diagnosis: Sepsis (HCC) [A41.9]         Precautions: fall    ASSESSMENT :  At baseline, patient was independent with ADLs, IADLs, and functional mobility/ ambulatory without AD.  On room air.  No recent falls.  Walking his dog 2 miles/ day.  Patient resided at home with his wife, who uses a walker vs wheelchair, at a [place] in [place] .  They are staying locally with their daughter for the holidays.    Patient now admitted for sepsis/ flu A and presents for OT evaluation with generalized weakness  and impaired balance.  He reports his current presentation is an improvement compared to a couple days ago when he could hardly get up.  Today he demonstrated [facility] AROM/ strength/ coordination for ADLs, ambulated within room with up to CGA, and maintained SPO2> 96% on room air with no respiratory distress.  He was generally unsteady with 1 mild loss of balance (self-corrected) and may benefit from a mobility AD (PT notified).  Will follow for OT in acute setting to optimize functional recovery and safety.  Anticipate no OT needs at d/c.    Addendum: Note patient mobilized better with PT in next session.  Patient's mobility/ balance was the primary concern for OT.  Further OT is not indicated.  Will complete OT order.    Functional Outcome Measure:  The patient scored 33% on the AM-PAC ADL outcome measure which is indicative of mild ADL impairment.         PLAN :  Recommendations and Planned Interventions:   self care training, therapeutic activities, functional mobility training, balance training, therapeutic exercise, endurance activities, patient education, home safety training, and family training/education    Frequency/Duration: OT Plan of Care: 5 times/week    Recommendation for discharge: (in order for the patient to meet his/her long term goals):   No skilled occupational therapy       SUBJECTIVE:   Patient stated, "This is better than a couple days ago when I could hardly get up."  OBJECTIVE DATA SUMMARY:     Past Medical History:  Diagnosis Date    CAD (coronary artery disease)     Diabetes (HCC)     Gout     Hypertension     Hypothyroid     Kidney stones     Pneumonia      Past Surgical History:   Procedure Laterality Date    CORONARY ANGIOPLASTY WITH STENT PLACEMENT      HERNIA REPAIR            Expanded or extensive additional review of patient history:   Social/Functional History  Lives With: Spouse, Daughter  Type of Home:  (info is for his daughter's house)  Home Layout: Two level, Bed/Bath  upstairs  Home Access: Stairs to enter with rails  Entrance Stairs - Number of Steps: 4  Entrance Stairs - Rails: Both  Bathroom Shower/Tub: Pension Scheme Manager: Standard  Home Equipment: None  Has the patient had two or more falls in the past year or any fall with injury in the past year?: No (near falls with acute weakness)  Prior Level of Assist for ADLs: Independent      EXAMINATION OF PERFORMANCE DEFICITS:    Cognitive/Behavioral Status:  Orientation  Overall Orientation Status: Within Normal Limits  Orientation Level: Oriented X4  Cognition  Overall Cognitive Status: WNL      Hearing:    HOH    Vision/Perceptual:              WFL            Range of Motion:   AROM: Within functional limits         Strength:  Strength: Generally decreased, functional      Coordination:  Coordination: Within functional limits              Functional Mobility and Transfers for ADLs:    Bed Mobility:     Bed Mobility Training  Bed Mobility Training: Yes  Supine to Sit: Supervision  Sit to Supine: Supervision    Transfers:      Art Therapist: Yes  Sit to Stand: Contact-guard assistance  Stand to Sit: Contact-guard assistance                     Balance:      Balance  Sitting: Intact  Standing: Impaired  Standing - Static: Good;Fair  Standing - Dynamic: Fair      ADL Assessment:          Feeding: Independent  Feeding Skilled Clinical Factors: inferred    Grooming: Independent  Grooming Skilled Clinical Factors: inferred    UE Bathing: Supervision  UE Bathing Skilled Clinical Factors: inferred         LE Bathing: Contact guard assistance  LE Bathing Skilled Clinical Factors: inferred    UE Dressing: Setup  UE Dressing Skilled Clinical Factors: inferred    LE Dressing: Contact guard assistance  LE Dressing Skilled Clinical Factors: inferred    Toileting: Contact guard assistance  Toileting Skilled Clinical Factors: inferred            [employer] AM-PACTM 6 Clicks                                                        Daily Activity Inpatient Short Form  AM-PAC Daily Activity -  Inpatient   How much help is needed for putting on and taking off regular lower body clothing?: A Little  How much help is needed for bathing (which includes washing, rinsing, drying)?: A Little  How much help is needed for toileting (which includes using toilet, bedpan, or urinal)?: A Little  How much help is needed for putting on and taking off regular upper body clothing?: None  How much help is needed for taking care of personal grooming?: None  How much help for eating meals?: None  AM-PAC Inpatient Daily Activity Raw Score: 21  AM-PAC Inpatient ADL T-Scale Score : 44.27  ADL Inpatient CMS 0-100% Score: 32.79  ADL Inpatient CMS G-Code Modifier : CJ     Interpretation of Tool:  Represents clinically-significant functional categories (i.e. Activities of daily living).  Cutoff score 39.4 (19) correlates to a good likelihood of discharging home versus a facility  Diane U. Aneta, Ronal Broody, Vinoth K. Ranganathan, Sandra D. Passek, Gilmore RAMAN. Waldemar Dale EMERSON Aneta, AM-PAC "6-Clicks" Functional Assessment Scores Predict Acute Care Hospital Discharge Destination, Physical Therapy, Volume 94, Issue 9, 18 June 2013, Pages [phone], [email or web address]       Pain Rating:  Patient did not report pain    Activity Tolerance:   Good and SpO2 stable on room air    After treatment:   Patient left in no apparent distress in bed, Call bell within reach, and Bed/ chair alarm activated    COMMUNICATION/EDUCATION:   The patient's plan of care was discussed with: physical therapist and registered nurse         Thank you for this referral.  Ole Pouch, OT  Minutes: 20    Occupational Therapy Evaluation Charge Determination   History Examination Decision-Making   LOW Complexity : Brief history review  MEDIUM Complexity: 3-5 Performance deficits relating to physical, cognitive, or psychosocial skills that result in  activity limitations and/or participation restrictions MEDIUM Complexity: Patient may present with comorbidities that affect occupational performance. Minimal to moderate modifications of tasks or assist (eg. physical or verbal) with assist is necessary to enable pt to complete eval   Based on the above components, the patient evaluation is determined to be of the following complexity level: Low

## 2023-10-20 NOTE — Progress Notes (Signed)
 Tamiflu 75 mg PO BID x 5 days adjusted to 30 mg daily x 5 days per protocol for CrCl 29 mL/min    Thank you,  Antoinette Haskett L. Butch Penny D, [place]  [phone]

## 2023-10-20 NOTE — ED Notes (Signed)
 Bedside and Verbal shift change report given to Meaghan (cabin crew) by Sabra (offgoing nurse). Report included the following information Nurse Handoff Report, ED Encounter Summary, ED SBAR, Intake/Output, MAR, Recent Results, Cardiac Rhythm NSR, Quality Measures, and Neuro Assessment.

## 2023-10-21 DIAGNOSIS — J9601 Acute respiratory failure with hypoxia: Secondary | ICD-10-CM | POA: Diagnosis not present

## 2023-10-21 DIAGNOSIS — J09X9 Influenza due to identified novel influenza A virus with other manifestations: Secondary | ICD-10-CM | POA: Diagnosis not present

## 2023-10-21 DIAGNOSIS — R6520 Severe sepsis without septic shock: Secondary | ICD-10-CM | POA: Diagnosis not present

## 2023-10-21 DIAGNOSIS — A419 Sepsis, unspecified organism: Secondary | ICD-10-CM | POA: Diagnosis not present

## 2023-10-21 LAB — POCT GLUCOSE
POC Glucose: 96 mg/dL (ref 65–117)
POC Glucose: 89 mg/dL (ref 65–117)
POC Glucose: 121 mg/dL — ABNORMAL HIGH (ref 65–117)

## 2023-10-21 LAB — CBC WITH AUTO DIFFERENTIAL
Basophils %: 0 % (ref 0–1)
Basophils Absolute: 0 10*3/uL (ref 0.0–0.1)
Eosinophils %: 0 % (ref 0–7)
Eosinophils Absolute: 0 10*3/uL (ref 0.0–0.4)
Hematocrit: 39 % (ref 36.6–50.3)
Hemoglobin: 13.3 g/dL (ref 12.1–17.0)
Immature Granulocytes %: 1 % — ABNORMAL HIGH (ref 0.0–0.5)
Immature Granulocytes Absolute: 0.1 10*3/uL — ABNORMAL HIGH (ref 0.00–0.04)
Lymphocytes %: 8 % — ABNORMAL LOW (ref 12–49)
Lymphocytes Absolute: 1.5 10*3/uL (ref 0.8–3.5)
MCH: 29.9 pg (ref 26.0–34.0)
MCHC: 34.1 g/dL (ref 30.0–36.5)
MCV: 87.6 FL (ref 80.0–99.0)
MPV: 10.8 FL (ref 8.9–12.9)
Monocytes %: 9 % (ref 5–13)
Monocytes Absolute: 1.8 10*3/uL — ABNORMAL HIGH (ref 0.0–1.0)
Neutrophils %: 82 % — ABNORMAL HIGH (ref 32–75)
Neutrophils Absolute: 16 10*3/uL — ABNORMAL HIGH (ref 1.8–8.0)
Nucleated RBCs: 0 /100{WBCs}
Platelets: 257 10*3/uL (ref 150–400)
RBC: 4.45 M/uL (ref 4.10–5.70)
RDW: 15.1 % — ABNORMAL HIGH (ref 11.5–14.5)
WBC: 19.5 10*3/uL — ABNORMAL HIGH (ref 4.1–11.1)
nRBC: 0 10*3/uL (ref 0.00–0.01)

## 2023-10-21 LAB — BASIC METABOLIC PANEL
Anion Gap: 7 mmol/L (ref 2–12)
BUN/Creatinine Ratio: 15 (ref 12–20)
BUN: 28 mg/dL — ABNORMAL HIGH (ref 6–20)
CO2: 24 mmol/L (ref 21–32)
Calcium: 8.3 mg/dL — ABNORMAL LOW (ref 8.5–10.1)
Chloride: 110 mmol/L — ABNORMAL HIGH (ref 97–108)
Creatinine: 1.83 mg/dL — ABNORMAL HIGH (ref 0.70–1.30)
Est, Glom Filt Rate: 36 mL/min/{1.73_m2} — ABNORMAL LOW (ref >60)
Glucose: 94 mg/dL (ref 65–100)
Potassium: 4.4 mmol/L (ref 3.5–5.1)
Sodium: 141 mmol/L (ref 136–145)

## 2023-10-21 LAB — PHOSPHORUS: Phosphorus: 2.3 mg/dL — ABNORMAL LOW (ref 2.6–4.7)

## 2023-10-21 MED ORDER — OSELTAMIVIR PHOSPHATE 30 MG PO CAPS
30 | ORAL_CAPSULE | Freq: Every day | ORAL | 0 refills | Status: AC
Start: 2023-10-21 — End: 2023-10-25

## 2023-10-21 MED ORDER — AZITHROMYCIN 500 MG PO TABS
500 | ORAL_TABLET | Freq: Once | ORAL | 0 refills | Status: AC
Start: 2023-10-21 — End: 2023-10-22
  Filled 2023-10-21: qty 1, 1d supply, fill #0

## 2023-10-21 MED ORDER — CEFDINIR 300 MG PO CAPS
300 | ORAL_CAPSULE | Freq: Every day | ORAL | 0 refills | Status: AC
Start: 2023-10-21 — End: 2023-10-24
  Filled 2023-10-21: qty 3, 3d supply, fill #0

## 2023-10-21 MED FILL — SODIUM BICARBONATE 650 MG PO TABS: 650 MG | ORAL | Qty: 1

## 2023-10-21 MED FILL — METOPROLOL TARTRATE 25 MG PO TABS: 25 MG | ORAL | Qty: 1

## 2023-10-21 MED FILL — ASPIRIN LOW DOSE 81 MG PO TBEC: 81 MG | ORAL | Qty: 1

## 2023-10-21 MED FILL — GABAPENTIN 100 MG PO CAPS: 100 MG | ORAL | Qty: 1

## 2023-10-21 MED FILL — ENOXAPARIN SODIUM 30 MG/0.3ML IJ SOSY: 30 MG/0.3ML | INTRAMUSCULAR | Qty: 0.3

## 2023-10-21 MED FILL — CEFTRIAXONE SODIUM 2 G IJ SOLR: 2 g | INTRAMUSCULAR | Qty: 2000

## 2023-10-21 MED FILL — LEVOTHYROXINE SODIUM 100 MCG PO TABS: 100 MCG | ORAL | Qty: 1

## 2023-10-21 MED FILL — OSELTAMIVIR PHOSPHATE 30 MG PO CAPS: 30 MG | ORAL | Qty: 1

## 2023-10-21 MED FILL — FINASTERIDE 5 MG PO TABS: 5 MG | ORAL | Qty: 1

## 2023-10-21 MED FILL — AZITHROMYCIN 500 MG IV SOLR: 500 MG | INTRAVENOUS | Qty: 500

## 2023-10-21 NOTE — Discharge Instructions (Signed)
 Hospital Medicine DISCHARGE INSTRUCTIONS    NAME: Brian Freeman   DOB:  02/28/1941   MRN:  [id number]     Date:     10/21/2023    Admission date: 10/19/2023     Discharge date:  10/21/2023     Reason for your admission:  Dehydration [E86.0]  Gastroenteritis [K52.9]  Sepsis (HCC) [A41.9]    Discharge Diagnoses:  Influenza A infection  Suspected aspiration    DISCHARGE INSTRUCTIONS:    Thank you for allowing us  to participate in your care. Your discharging Hospitalist is Lamar Doom, MD. Rosine were admitted for evaluation and treatment for the above diagnoses.    Medications:     It is important that medications are taken exactly as they are prescribed on the discharge medication instructions and keep them your  in the bottles provided by the pharmacist.   Keep a list of the medication names, dosages, and times to be taken at all times.    Do not take other medications without consulting your doctor.     Recommended diet:  regular diet    Recommended activity: activity as tolerated    Post discharge care:    For questions regarding your Hospitalization or to contact the Hospital Medicine team, please call [phone].    Notify follow up health care provider or return to the emergency department if you cannot get hold of your doctor if you feel worse or experience symptoms similar to those that brought you to hospital    No follow-up provider specified.     Information obtained by :  I understand that if any problems occur once I am at home I am to contact my physician and I understand and acknowledge receipt of the instructions indicated above.                                                                                                                                           Physician's or R.N.'s Signature                                                                  Date/Time  Patient or Sales Promotion Account Executive

## 2023-10-21 NOTE — Discharge Summary (Signed)
 [facility]  [address] [place], [place] [address]  Tel: [phone]    Hospital Medicine Discharge Summary    Patient ID:    Brian Freeman  Age:              83 y.o.    DOB: 1941/02/26  MRN:             [id number]     PCP: No primary care provider on file.     Date of Admission: 10/19/2023    Date of Discharge:  10/21/2023    Discharge Diagnoses:  Principal Problem:    Influenza A infection     Reason for admission:    Dehydration [E86.0]  Gastroenteritis [K52.9]  Sepsis [facility]) [A41.9]    Diagnostic testing:    Laboratory data reviewed and independently interpreted:    Recent Labs     10/19/23  2016 10/20/23  0611 10/21/23  0155   WBC 26.3* 28.0* 19.5*   HGB 15.4 13.7 13.3   HCT 45.3 41.1 39.0   RBC 5.22 4.61 4.45   MCV 86.8 89.2 87.6   MCH 29.5 29.7 29.9   PLT 317 267 257     No results found for: LACTA  Recent Labs     10/19/23  2016 10/20/23  0611 10/21/23  0155   NA 137 139 141   K 4.7 4.5 4.4   CL 104 110* 110*   CO2 22 22 24    GLUCOSE 249* 145* 94   BUN 24* 26* 28*   CREATININE 1.98* 2.04* 1.83*   CALCIUM 9.2 7.7* 8.3*   PHOS  --  3.8 2.3*   BILITOT 0.6  --   --    ALKPHOS 74  --   --    AST 20  --   --    ALT 15  --   --      No components found for: GLUCOSEPOC  Lab Results   Component Value Date/Time    CHOL 84 10/19/2023 11:55 PM    TRIG 51 10/19/2023 11:55 PM    HDL 47 10/19/2023 11:55 PM       Imaging data reviewed:    US  RETROPERITONEAL COMPLETE    Result Date: 10/20/2023  No right hydronephrosis or large shadowing calculus. Atrophy and lobulated cortical scarring. Left kidney not shown. Electronically signed by Medford Manners    XR CHEST PORTABLE    Result Date: 10/20/2023  Interstitial pulmonary edema. Electronically signed by Medford Manners    CT ABDOMEN PELVIS W IV CONTRAST Additional Contrast? None    Result Date: 10/19/2023  No bowel obstruction, ileus or perforation. No intra-abdominal abscess. Electronically signed by  Krystal Grippe, MD    Hospital Course:     Brian Freeman is a very pleasant 83 y.o. male with a past medical history of CAD, diabetes, HTN, H LD, hypothyroidism, who presents to the emergency room due to profound nausea vomiting and diarrhea.     #Severe Sepsis: 4/4 SIRS with lactate 2.8 on arrival. Procal is 7.2. Flu A positive, but possible bacterial infection. Responded well to IV fluids, empiric IV Ceftriaxone  and Azithromycin , Tamiflu . His symptoms have markedly improved. Discussed with daughter. Discharged home in stable condition on medications as below.      #AHRF: due to the influenza A and pulmonary edema. 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 Improved.      #  Diabetes with hyperglycemia, POA: No home medications     #Hypertension POA; Continue Metoprolol      #Hyperlipidemia POA: Continue simvastatin     #Hypothyroidism, POA: Continue Levothyroxine      Chronic hyperkalemia POA: continue Lokelma     Discharge Exam:  BP (!) 143/81   Pulse 76   Temp 98.8 F (37.1 C) (Oral)   Resp 17   Ht 1.803 m (5' 11)   Wt 74.8 kg (165 lb)   SpO2 93%   BMI 23.01 kg/m      Patient has been seen and examined.    General: well looking and in no acute distress  Pulm: clear breath sounds without wheezes  Card: no murmurs, normal S1, S2 without thrills, bruits   Abd:    soft, non-tender, normoactive bowel sounds  Skin: no rashes and skin turgor is good  Neuro: awake, alert and has a non focal     Activity: Activity as tolerated    Diet: regular diet    Current Discharge Medication List        START taking these medications    Details   oseltamivir  (TAMIFLU ) 30 MG capsule Take 1 capsule by mouth daily for 3 doses  Qty: 3 capsule, Refills: 0      cefdinir  (OMNICEF ) 300 MG capsule Take 1 capsule by mouth daily for 3 days  Qty: 3 capsule, Refills: 0      azithromycin  (ZITHROMAX ) 500 MG tablet Take 1 tablet by mouth once for 1 dose  Qty: 1 tablet, Refills: 0            CONTINUE these medications which have NOT CHANGED    Details   aspirin  81 MG EC tablet Take 1 tablet by mouth daily      allopurinol (ZYLOPRIM) 100 MG tablet Take 1 tablet by mouth daily      bethanechol (URECHOLINE) 10 MG tablet Take 1 tablet by mouth 2 times daily      vitamin D (VITAMIN D-1000 MAX ST) 25 MCG (1000 UT) TABS tablet Take 2 tablets by mouth daily      finasteride  (PROSCAR ) 5 MG tablet Take 1 tablet by mouth daily      gabapentin  (NEURONTIN ) 100 MG capsule Take 1 capsule by mouth in the morning and at bedtime.      levothyroxine  (SYNTHROID ) 175 MCG tablet Take 100 mcg by mouth daily      metoprolol  tartrate (LOPRESSOR ) 25 MG tablet Take 1 tablet by mouth 2 times daily      simvastatin (ZOCOR) 20 MG tablet Take 1 tablet by mouth daily      sodium bicarbonate  650 MG tablet Take 1 tablet by mouth 2 times daily      sodium zirconium cyclosilicate (LOKELMA) 10 g PACK oral suspension Take 1 packet by mouth daily           STOP taking these medications       metFORMIN (GLUCOPHAGE) 500 MG tablet Comments:   Reason for Stopping:               No follow-up provider specified.    Follow-up tests or labs: As noted above    Discharge Condition: Stable    Disposition: home    Time taken to co-ordinate and arrange discharge:  more than 30 minutes.    Signed:  Lamar Doom, MD       10/21/2023   1:44 PM

## 2023-10-21 NOTE — Care Coordination (Signed)
 Case Management Discharge Note    Discharge Plan:  DC to home today with no CM needs identified.    Transportation at DC:  Family    Please reach out to CM if any DC needs arise.    ______________________________________  Creighton Mulling, BSN, RN-CM  [facility]- Care Management  Available via [facility]  10/21/2023  2:45 PM

## 2023-10-21 NOTE — Plan of Care (Signed)
 Problem: Skin/Tissue Integrity  Goal: Absence of new skin breakdown  Description: 1.  Monitor for areas of redness and/or skin breakdown  2.  Assess vascular access sites hourly  3.  Every 4-6 hours minimum:  Change oxygen saturation probe site  4.  Every 4-6 hours:  If on nasal continuous positive airway pressure, respiratory therapy assess nares and determine need for appliance change or resting period.  Outcome: Adequate for Discharge     Problem: ABCDS Injury Assessment  Goal: Absence of physical injury  Outcome: Adequate for Discharge     Problem: Safety - Adult  Goal: Free from fall injury  Outcome: Adequate for Discharge     Problem: Chronic Conditions and Co-morbidities  Goal: Patient's chronic conditions and co-morbidity symptoms are monitored and maintained or improved  Outcome: Adequate for Discharge     Problem: Discharge Planning  Goal: Discharge to home or other facility with appropriate resources  Outcome: Adequate for Discharge

## 2023-10-25 LAB — CULTURE, BLOOD 1: Culture: NO GROWTH

## 2023-10-25 LAB — CULTURE, BLOOD 2: Culture: NO GROWTH

## 2023-12-13 ENCOUNTER — Other Ambulatory Visit: Payer: Self-pay | Admitting: Physician Assistant

## 2023-12-13 DIAGNOSIS — I10 Essential (primary) hypertension: Secondary | ICD-10-CM

## 2023-12-13 DIAGNOSIS — E1121 Type 2 diabetes mellitus with diabetic nephropathy: Secondary | ICD-10-CM

## 2023-12-13 DIAGNOSIS — Z8739 Personal history of other diseases of the musculoskeletal system and connective tissue: Secondary | ICD-10-CM

## 2023-12-16 ENCOUNTER — Other Ambulatory Visit: Payer: Self-pay | Admitting: Physician Assistant

## 2023-12-28 ENCOUNTER — Ambulatory Visit: Payer: Medicare PPO | Admitting: Podiatry

## 2024-01-16 ENCOUNTER — Other Ambulatory Visit: Payer: Self-pay | Admitting: Physician Assistant

## 2024-01-16 DIAGNOSIS — E039 Hypothyroidism, unspecified: Secondary | ICD-10-CM

## 2024-01-23 DIAGNOSIS — Z7984 Long term (current) use of oral hypoglycemic drugs: Secondary | ICD-10-CM | POA: Diagnosis not present

## 2024-01-23 DIAGNOSIS — H43393 Other vitreous opacities, bilateral: Secondary | ICD-10-CM | POA: Diagnosis not present

## 2024-01-23 DIAGNOSIS — H524 Presbyopia: Secondary | ICD-10-CM | POA: Diagnosis not present

## 2024-01-23 DIAGNOSIS — Z961 Presence of intraocular lens: Secondary | ICD-10-CM | POA: Diagnosis not present

## 2024-01-23 DIAGNOSIS — E119 Type 2 diabetes mellitus without complications: Secondary | ICD-10-CM | POA: Diagnosis not present

## 2024-01-23 DIAGNOSIS — H40013 Open angle with borderline findings, low risk, bilateral: Secondary | ICD-10-CM | POA: Diagnosis not present

## 2024-01-23 DIAGNOSIS — H02831 Dermatochalasis of right upper eyelid: Secondary | ICD-10-CM | POA: Diagnosis not present

## 2024-01-23 DIAGNOSIS — H02834 Dermatochalasis of left upper eyelid: Secondary | ICD-10-CM | POA: Diagnosis not present

## 2024-01-23 LAB — HM DIABETES EYE EXAM

## 2024-01-26 ENCOUNTER — Other Ambulatory Visit: Payer: Self-pay

## 2024-01-26 DIAGNOSIS — E039 Hypothyroidism, unspecified: Secondary | ICD-10-CM

## 2024-01-27 ENCOUNTER — Other Ambulatory Visit

## 2024-01-27 ENCOUNTER — Ambulatory Visit: Admitting: Physician Assistant

## 2024-01-27 ENCOUNTER — Encounter: Payer: Self-pay | Admitting: Physician Assistant

## 2024-01-27 VITALS — BP 140/80 | HR 71 | Temp 97.6°F | Resp 14 | Ht 69.0 in | Wt 162.0 lb

## 2024-01-27 DIAGNOSIS — N5203 Combined arterial insufficiency and corporo-venous occlusive erectile dysfunction: Secondary | ICD-10-CM

## 2024-01-27 DIAGNOSIS — E039 Hypothyroidism, unspecified: Secondary | ICD-10-CM

## 2024-01-27 HISTORY — DX: Combined arterial insufficiency and corporo-venous occlusive erectile dysfunction: N52.03

## 2024-01-27 MED ORDER — SILDENAFIL CITRATE 25 MG PO TABS: 25.0000 mg | ORAL_TABLET | Freq: Every day | ORAL | 0 refills | Status: DC | PRN

## 2024-01-27 NOTE — Progress Notes (Signed)
 Subjective:  Patient ID: David Baxter, male    DOB: Nov 17, 1940  Age: 82 y.o. MRN: [id number]  Chief Complaint  Patient presents with   Male Problems    Patient is here for male problems. He mentioned to have ED problems. Patient states that he has been having some ED issues for the past few years. States that he has not tried any medicines and is interested in trying one. Discussed the patients heart health. Patient states that he has not had to change his medicine in a while and denies any current chest pain. Discussed the patients nitroglycerin and asked if he is having to take that frequently. Patient stated he took it for the first time in years 4 days ago. Denies any continued chest pain or needing to take it again. Discussed alternative options of topical gels for ED. Patient states he would prefer not to use any gels and would rather try medicine. Discussed with the patient to not use both nitroglycerin and Viagra within the same day. Discussed waiting till they look more into the chest pain and make sure they are not needing to work up his new onset chest pain. Patient agreed and voiced understanding.        02/21/2023   10:09 AM 11/02/2022    3:32 PM 01/26/2022    8:08 AM 09/22/2021    8:24 AM 05/11/2020    7:00 PM  Depression screen PHQ 2/9  Decreased Interest 0 0 0 0 0  Down, Depressed, Hopeless 0 0 0 0 0  PHQ - 2 Score 0 0 0 0 0  Altered sleeping  0     Tired, decreased energy  0     Change in appetite  0     Feeling bad or failure about yourself   0     Trouble concentrating  0     Moving slowly or fidgety/restless  0     Suicidal thoughts  0     PHQ-9 Score  0           02/21/2023   10:07 AM  Fall Risk   Falls in the past year? 0  Number falls in past yr: 0  Injury with Fall? 0  Risk for fall due to : No Fall Risks  Follow up Falls evaluation completed    Patient Care Team: Marianne Sofia, Cordelia Poche as PCP - General (Physician Assistant) Zettie Pho, [facility]  (Inactive) (Pharmacist) Adegoroye, Charlies Silvers, MD (Specialist)   Review of Systems  Constitutional:  Negative for chills, fatigue, fever and unexpected weight change.  HENT:  Negative for congestion, ear pain, sinus pain and sore throat.   Respiratory:  Negative for cough and shortness of breath.   Cardiovascular:  Negative for chest pain and palpitations.  Gastrointestinal:  Negative for abdominal pain, blood in stool, constipation, diarrhea, nausea and vomiting.  Endocrine: Negative for polydipsia.  Genitourinary:  Negative for dysuria.       ED  Musculoskeletal:  Negative for back pain.  Skin:  Negative for rash.  Neurological:  Negative for headaches.    Current Outpatient Medications on File Prior to Visit  Medication Sig Dispense Refill   ACCU-CHEK AVIVA PLUS test strip USE TWICE DAILY AS DIRECTED 200 strip 10   Accu-Chek FastClix Lancets MISC 1 each by Does not apply route in the morning and at bedtime. Use to check blood sugar twice daily as directed. 100 each 6   Alcohol Swabs (ALCOHOL PREP) 70 % PADS  1 each by Does not apply route daily. 100 each 2   allopurinol (ZYLOPRIM) 100 MG tablet TAKE 1 TABLET EVERY DAY 90 tablet 1   aspirin EC 81 MG tablet Take 81 mg by mouth daily.     bethanechol (URECHOLINE) 10 MG tablet TAKE 1 TABLET (10 MG TOTAL) BY MOUTH IN THE MORNING AND AT BEDTIME. 180 tablet 3   Blood Glucose Calibration (ACCU-CHEK AVIVA) SOLN 1 each by In Vitro route every 30 (thirty) days. 1 each 5   Cholecalciferol (VITAMIN D-1000 MAX ST) 25 MCG (1000 UT) tablet Take 2,000 Units by mouth.      COLCRYS 0.6 MG tablet Prn gout flares     Cyanocobalamin (B-12 PO) Take by mouth.     finasteride (PROSCAR) 5 MG tablet TAKE 1 TABLET (5 MG TOTAL) BY MOUTH DAILY. 90 tablet 3   gabapentin (NEURONTIN) 100 MG capsule Take 1 capsule (100 mg total) by mouth 2 (two) times daily. 180 capsule 0   levothyroxine (SYNTHROID) 175 MCG tablet Take 1 tablet (175 mcg total) by mouth daily. 30  tablet 3   LOKELMA 10 g PACK packet Take 10 g by mouth daily. 90 each 1   meclizine (ANTIVERT) 25 MG tablet Take by mouth. Takes OTC prn dizziness.     metFORMIN (GLUCOPHAGE) 500 MG tablet TAKE 1 TABLET TWICE DAILY 180 tablet 1   metoprolol tartrate (LOPRESSOR) 25 MG tablet TAKE 1 TABLET TWICE DAILY 180 tablet 1   nitroGLYCERIN (NITROSTAT) 0.4 MG SL tablet Place 0.4 mg under the tongue every 5 (five) minutes as needed for chest pain.     simvastatin (ZOCOR) 20 MG tablet TAKE 1 TABLET EVERY DAY 90 tablet 1   sodium bicarbonate 650 MG tablet Take by mouth 2 (two) times daily.     No current facility-administered medications on file prior to visit.   Past Medical History:  Diagnosis Date   Acquired hypothyroidism 04/08/2018   Acute retention of urine 04/09/2018   Acute sinusitis 06/05/2020   AKI (acute kidney injury) (HCC) 04/08/2018   Arthralgia of both lower legs    BMI 23.0-23.9, adult 04/21/2020   BPH (benign prostatic hyperplasia)    BPH with urinary obstruction 12/05/2019   CAD (coronary artery disease)    cardiologist--  dr Dulce Sellar w/ [facility] in [place]   CKD (chronic kidney disease) stage 3, GFR 30-59 ml/min (HCC) 07/06/2015   CKD (chronic kidney disease), stage III (HCC)    Coronary artery disease involving coronary bypass graft of native heart 04/09/2015   Overview:  1. DES OM 2012    2. 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   SIRS (systemic inflammatory  response syndrome) (HCC) 04/08/2018   Type 2 diabetes mellitus (HCC) 04/08/2018   UTI (urinary tract infection) 04/08/2018   Past Surgical History:  Procedure Laterality Date   CORONARY ANGIOPLASTY WITH STENT PLACEMENT  03/04/2015   x2 DES to CFX   CORONARY ANGIOPLASTY WITH STENT PLACEMENT  2012   x1 DES to OM   CYSTOSCOPY WITH INSERTION OF UROLIFT N/A 02/14/2017   Procedure: CYSTOSCOPY WITH INSERTION OF UROLIFT;  Surgeon: Malen Gauze, MD;  Location: [facility];  Service: Urology;  Laterality: N/A;   EXTRACORPOREAL SHOCK WAVE LITHOTRIPSY  1990's   PERCUTANEOUS NEPHROSTOLITHOTOMY  1991   and abdominal hernia repair   URETEROLITHOTOMY  1991 and 1994    Family History  Problem Relation Age of Onset   Heart disease Mother 42       After age 38 - CHF   Hypertension Father    Heart disease Father 8       After age 28 - CHF   Social History   Socioeconomic History   Marital status: Married    Spouse name: Not on file   Number of children: Not on file   Years of education: Not on file   Highest education level: Not on file  Occupational History   Occupation: truck driver  Tobacco Use   Smoking status: Never    Passive exposure: Never   Smokeless tobacco: Never  Vaping Use   Vaping status: Never Used  Substance and Sexual Activity   Alcohol use: No   Drug use: No   Sexual activity: Yes    Partners: Female  Other Topics Concern   Not on file  Social History Narrative   Not on file   Social Drivers of Health   Financial Resource Strain: High Risk (08/18/2022)   Overall Financial Resource Strain (CARDIA)    Difficulty of Paying Living Expenses: Hard  Food Insecurity: No Food Insecurity (10/20/2023)   Received from [facility]   Hunger Vital Sign    Worried About Running Out of Food in the Last Year: Never true    Ran Out of Food in the Last Year: Never true  Transportation Needs: No Transportation Needs (10/20/2023)   Received  from [facility]   PRAPARE - Administrator, Civil Service (Medical): No    Lack of Transportation (Non-Medical): No  Physical Activity: Not on file  Stress: Not on file  Social Connections: Not on file    Objective:  BP (!) 140/80   Pulse 71   Temp 97.6 F (36.4 C)   Resp 14   Ht 5\' 9"  (1.753 m)   Wt 162 lb (73.5 kg)   SpO2 99%   BMI 23.92 kg/m      01/27/2024   10:06 AM 08/29/2023    9:01 AM 08/29/2023    8:23 AM  BP/Weight  Systolic BP 140 140 160  Diastolic BP 80 80 100  Wt. (Lbs) 162  162  BMI 23.92 kg/m2  23.92 kg/m2    Physical Exam Vitals reviewed.  Constitutional:      Appearance: Normal appearance.  Cardiovascular:     Rate and Rhythm: Normal rate and regular rhythm.     Heart sounds: Normal heart sounds.  Pulmonary:     Effort: Pulmonary effort is normal.     Breath sounds: Normal breath sounds.  Abdominal:     General: Bowel sounds are normal.     Palpations: Abdomen is soft.     Tenderness: There is no abdominal tenderness.  Neurological:     Mental Status: He is alert and oriented to person, place, and time.  Psychiatric:  Mood and Affect: Mood normal.        Behavior: Behavior normal.     Diabetic Foot Exam - Simple   No data filed      Lab Results  Component Value Date   WBC 9.1 08/29/2023   HGB 14.8 08/29/2023   HCT 45.4 08/29/2023   PLT 367 08/29/2023   GLUCOSE 105 (H) 08/29/2023   CHOL 147 08/29/2023   TRIG 65 08/29/2023   HDL 55 08/29/2023   LDLCALC 79 08/29/2023   ALT 10 08/29/2023   AST 15 08/29/2023   NA 144 08/29/2023   K 5.9 (H) 08/29/2023   CL 102 08/29/2023   CREATININE 1.72 (H) 08/29/2023   BUN 23 08/29/2023   CO2 23 08/29/2023   TSH 5.860 (H) 08/29/2023   HGBA1C 6.1 (H) 08/29/2023   MICROALBUR 80 12/23/2020      Assessment & Plan:   Combined arterial insufficiency and corporo-venous occlusive erectile dysfunction Assessment & Plan: Discussed use of Viagra and  avoiding use of Nitroglycerin in the same day Patient will monitor bp after taking viagra Will schedule follow up if he has any issues or questions   Orders: -     Sildenafil Citrate; Take 1 tablet (25 mg total) by mouth daily as needed for erectile dysfunction.  Dispense: 10 tablet; Refill: 0     Meds ordered this encounter  Medications   sildenafil (VIAGRA) 25 MG tablet    Sig: Take 1 tablet (25 mg total) by mouth daily as needed for erectile dysfunction.    Dispense:  10 tablet    Refill:  0    No orders of the defined types were placed in this encounter.    Follow-up: No follow-ups on file.   I,Marla I Leal-Borjas,acting as a scribe for [employer], PA.,have documented all relevant documentation on the behalf of Langley Gauss, PA,as directed by  Langley Gauss, PA while in the presence of Langley Gauss, [place].   An After Visit Summary was printed and given to the patient.  Langley Gauss, [place] [facility] [phone]

## 2024-01-27 NOTE — Assessment & Plan Note (Signed)
 Discussed use of Viagra and avoiding use of Nitroglycerin in the same day Patient will monitor bp after taking viagra Will schedule follow up if he has any issues or questions

## 2024-01-28 LAB — TSH: TSH: 0.124 u[IU]/mL — ABNORMAL LOW (ref 0.450–4.500)

## 2024-01-30 ENCOUNTER — Other Ambulatory Visit: Payer: Self-pay

## 2024-01-30 DIAGNOSIS — E039 Hypothyroidism, unspecified: Secondary | ICD-10-CM

## 2024-01-30 MED ORDER — LEVOTHYROXINE SODIUM 150 MCG PO TABS: 150.0000 ug | ORAL_TABLET | Freq: Every day | ORAL | 3 refills | Status: DC

## 2024-02-14 ENCOUNTER — Other Ambulatory Visit: Payer: Self-pay

## 2024-02-14 DIAGNOSIS — I2510 Atherosclerotic heart disease of native coronary artery without angina pectoris: Secondary | ICD-10-CM | POA: Insufficient documentation

## 2024-02-14 DIAGNOSIS — E039 Hypothyroidism, unspecified: Secondary | ICD-10-CM | POA: Insufficient documentation

## 2024-02-14 DIAGNOSIS — Z87442 Personal history of urinary calculi: Secondary | ICD-10-CM | POA: Insufficient documentation

## 2024-02-14 DIAGNOSIS — Z8673 Personal history of transient ischemic attack (TIA), and cerebral infarction without residual deficits: Secondary | ICD-10-CM | POA: Insufficient documentation

## 2024-02-14 DIAGNOSIS — N400 Enlarged prostate without lower urinary tract symptoms: Secondary | ICD-10-CM | POA: Insufficient documentation

## 2024-02-14 DIAGNOSIS — H9190 Unspecified hearing loss, unspecified ear: Secondary | ICD-10-CM | POA: Insufficient documentation

## 2024-02-14 DIAGNOSIS — E785 Hyperlipidemia, unspecified: Secondary | ICD-10-CM | POA: Insufficient documentation

## 2024-02-14 DIAGNOSIS — N183 Chronic kidney disease, stage 3 unspecified: Secondary | ICD-10-CM | POA: Insufficient documentation

## 2024-02-14 DIAGNOSIS — I10 Essential (primary) hypertension: Secondary | ICD-10-CM | POA: Insufficient documentation

## 2024-02-16 NOTE — Progress Notes (Signed)
 Cardiology Office Note:  .   Date:  02/17/2024  ID:  David Baxter, DOB Feb 28, 1941, MRN [id number] PCP: Cyndi Drain, PA-C  [facility] Providers Cardiologist:  None    History of Present Illness: .   David Baxter is a 83 y.o. male with a past medical history of CAD/P DES x 3, RBBB, hypertension, DM2, hypothyroidism, BPH, CKD stage III, history of gout, history of stroke.  2016 DES x 2 to left circumflex 2012 DES to OM  Mr. David Baxter is a longstanding patient of David Baxter.  In 2016 he underwent a left heart cath with DES x 2 to his left circumflex.  Most recently was evaluated with David Baxter on 08/16/2023, he was stable regarding his CAD, no changes were made to his medications or plan of care and he was advised to follow-up in 6 months.  He presents today for follow-up of his coronary artery disease.  I saw his wife today and she mentions he has had a few episodes of passing out.  I asked David Baxter to describe this further, he reports 2 episodes, 1 when he was visiting a relative in a nursing home and the other when he was accompanying his wife had a hair appointment, when his wife looked over he appeared to be unresponsive however with tapping and shaking him he regained consciousness.  EMS was not called, he was not evaluated for this.  I asked him about his water  intake, he is drinking approximately 16 ounces per day.  Ask him if he felt bad prior to these episodes, he said maybe he did feel a little bit sweaty and shaky however when he would wake back up he felt fine.  He denies dizziness but he does state he has episodes of being "swimmy headed ", that sound to be most consistent with vertigo, he mentions laying in the bed and he feels like the room is spinning but this does not occur frequently for him.  He denies chest pain, palpitations, dyspnea, pnd, orthopnea, n, v, dizziness, edema, weight gain, or early satiety.   ROS: Review of Systems  Neurological:  Positive for dizziness  and loss of consciousness.  Endo/Heme/Allergies:  Bruises/bleeds easily.  All other systems reviewed and are negative.    Studies Reviewed: .            Risk Assessment/Calculations:          Physical Exam:   VS:  BP (!) 144/70   Pulse (!) 58   Ht 5\' 9"  (1.753 m)   Wt 164 lb (74.4 kg)   SpO2 95%   BMI 24.22 kg/m    Wt Readings from Last 3 Encounters:  02/17/24 164 lb (74.4 kg)  01/27/24 162 lb (73.5 kg)  08/29/23 162 lb (73.5 kg)    GEN: Frail, no acute distress NECK: No JVD; No carotid bruits CARDIAC: RRR, no murmurs, rubs, gallops RESPIRATORY:  Clear to auscultation without rales, wheezing or rhonchi  ABDOMEN: Soft, non-tender, non-distended EXTREMITIES:  No edema; No deformity   ASSESSMENT AND PLAN: .   CAD-DES x 3, most recent intervention in 2016. Stable with no anginal symptoms. No indication for ischemic evaluation.  Continue aspirin 81 mg daily, continue metoprolol  25 mg twice daily, continue nitroglycerin as needed, continue Zocor  20 mg daily.  Syncope-will arrange for a live 2-week monitor.  His symptoms sound to be consistent with likely orthostasis, but I want to make sure there is not a worrisome arrhythmia.  Will  check BMET, CBC, magnesium for any contributory causes.  He is drinking approximately 16 ounces of water  per day and I have encouraged him to increase this to at least 48 ounces per day.  Hypertension-blood pressure slightly elevated today however we need to allow for permissive hypertension.  CKD -follows with Dr. Little Riff  Dyslipidemia-is monitored formally by his PCP, currently on Zocor .       Dispo: 2-week live monitor, lab work per above, follow-up with David Baxter in 6 months, sooner if there are any abnormalities on his monitor.  Signed, David Ferretti, NP

## 2024-02-17 ENCOUNTER — Ambulatory Visit: Attending: Cardiology | Admitting: Cardiology

## 2024-02-17 ENCOUNTER — Ambulatory Visit: Attending: Cardiology

## 2024-02-17 ENCOUNTER — Ambulatory Visit: Admitting: Cardiology

## 2024-02-17 ENCOUNTER — Encounter: Payer: Self-pay | Admitting: Cardiology

## 2024-02-17 VITALS — BP 144/70 | HR 58 | Ht 69.0 in | Wt 164.0 lb

## 2024-02-17 DIAGNOSIS — R55 Syncope and collapse: Secondary | ICD-10-CM

## 2024-02-17 DIAGNOSIS — N1832 Chronic kidney disease, stage 3b: Secondary | ICD-10-CM

## 2024-02-17 DIAGNOSIS — I129 Hypertensive chronic kidney disease with stage 1 through stage 4 chronic kidney disease, or unspecified chronic kidney disease: Secondary | ICD-10-CM

## 2024-02-17 DIAGNOSIS — E782 Mixed hyperlipidemia: Secondary | ICD-10-CM | POA: Diagnosis not present

## 2024-02-17 DIAGNOSIS — I2510 Atherosclerotic heart disease of native coronary artery without angina pectoris: Secondary | ICD-10-CM

## 2024-02-17 DIAGNOSIS — I452 Bifascicular block: Secondary | ICD-10-CM | POA: Diagnosis not present

## 2024-02-17 NOTE — Patient Instructions (Addendum)
 Medication Instructions:  Your physician recommends that you continue on your current medications as directed. Please refer to the Current Medication list given to you today.  *If you need a refill on your cardiac medications before your next appointment, please call your pharmacy*  Lab Work: Your physician recommends that you return for lab work in:   Labs today: CMP, Magnesium, CBC  If you have labs (blood work) drawn today and your tests are completely normal, you will receive your results only by: MyChart Message (if you have MyChart) OR A paper copy in the mail If you have any lab test that is abnormal or we need to change your treatment, we will call you to review the results.  Testing/Procedures: A zio monitor was ordered today. It will remain on for 14 days. You will then return monitor and event diary in provided box. It takes 1-2 weeks for report to be downloaded and returned to us . We will call you with the results. If monitor falls off or has orange flashing light, please call Zio for further instructions.    Follow-Up: At [facility], you and your health needs are our priority.  As part of our continuing mission to provide you with exceptional heart care, our providers are all part of one team.  This team includes your primary Cardiologist (physician) and Advanced Practice Providers or APPs (Physician Assistants and Nurse Practitioners) who all work together to provide you with the care you need, when you need it.  Your next appointment:   6 month(s)  Provider:   Zoe Hinds, MD  We recommend signing up for the patient portal called "MyChart".  Sign up information is provided on this After Visit Summary.  MyChart is used to connect with patients for Virtual Visits (Telemedicine).  Patients are able to view lab/test results, encounter notes, upcoming appointments, etc.  Non-urgent messages can be sent to your provider as well.   To learn more about what you can do  with MyChart, go to [email or web address].   Other Instructions Please drink more water 

## 2024-02-18 LAB — CBC
Hematocrit: 44.5 % (ref 37.5–51.0)
Hemoglobin: 14.6 g/dL (ref 13.0–17.7)
MCH: 29.5 pg (ref 26.6–33.0)
MCHC: 32.8 g/dL (ref 31.5–35.7)
MCV: 90 fL (ref 79–97)
Platelets: 325 x10E3/uL (ref 150–450)
RBC: 4.95 x10E6/uL (ref 4.14–5.80)
RDW: 13.8 % (ref 11.6–15.4)
WBC: 9.8 x10E3/uL (ref 3.4–10.8)

## 2024-02-18 LAB — COMPREHENSIVE METABOLIC PANEL WITH GFR
ALT: 9 IU/L (ref 0–44)
AST: 15 IU/L (ref 0–40)
Albumin: 4 g/dL (ref 3.7–4.7)
Alkaline Phosphatase: 79 IU/L (ref 44–121)
BUN/Creatinine Ratio: 15 (ref 10–24)
BUN: 25 mg/dL (ref 8–27)
Bilirubin Total: 0.3 mg/dL (ref 0.0–1.2)
CO2: 24 mmol/L (ref 20–29)
Calcium: 9 mg/dL (ref 8.6–10.2)
Chloride: 104 mmol/L (ref 96–106)
Creatinine, Ser: 1.65 mg/dL — ABNORMAL HIGH (ref 0.76–1.27)
Globulin, Total: 2.5 g/dL (ref 1.5–4.5)
Glucose: 94 mg/dL (ref 70–99)
Potassium: 5.3 mmol/L — ABNORMAL HIGH (ref 3.5–5.2)
Sodium: 142 mmol/L (ref 134–144)
Total Protein: 6.5 g/dL (ref 6.0–8.5)
eGFR: 41 mL/min/1.73 — ABNORMAL LOW

## 2024-02-18 LAB — MAGNESIUM: Magnesium: 1.7 mg/dL (ref 1.6–2.3)

## 2024-02-21 ENCOUNTER — Telehealth: Payer: Self-pay | Admitting: Cardiology

## 2024-02-21 NOTE — Telephone Encounter (Signed)
 Called and spoke to [name] at [facility] and she reported that the patient's gateway for their live zio heart monitor was not transmitting. [name] gave me instructions for how to activate the patient's gateway.  I called the patient and he was not home but I was able to speak to his wife and she stated that they will call the office so we can help activate the patients gate way when he returns home The patient's wife had no further questions at this time.

## 2024-02-21 NOTE — Telephone Encounter (Signed)
 Caller David Baxter) called to provide a courtesy update that the patient has not activated their live heart monitor as yet.

## 2024-02-21 NOTE — Progress Notes (Signed)
 Subjective:  Patient ID: David Baxter, male    DOB: [birth year]  Age: 83 y.o. MRN: [id number]  Chief Complaint  Patient presents with   Discuss ED medication not working    HPI:  Discussed the use of AI scribe software for clinical note transcription with the patient, who gave verbal consent to proceed.  History of Present Illness   David Baxter is an 83 year old male who presents to discuss viagra  efficacy and side effects.  His current medication regimen has not been effective. He takes two pills but has not noticed any difference in his symptoms. He forgot to avoid taking nitroglycerin before the medication, which may have affected its efficacy.  He experiences dizziness, described as 'swimmy headedness', where the bed seems to spin around him. He attributes this to possibly not drinking enough water , as he consumes about two 18-ounce glasses daily. He has not experienced any falls but is cautious to avoid them.  His blood pressure has been high, with a recent reading of 152/94 mmHg. He has not been checking his blood pressure regularly at home but does monitor his blood sugar occasionally. He attributes the elevated blood pressure to stress from a heart monitor on his chest, which was placed by cardiology for monitoring purposes.  No chest pain and he has not taken nitroglycerin recently. Discussed with him waiting till he can get the results of his heart monitor along with meeting with his cardiologist about if his heart is healthy enough for sexual activity.          02/21/2023   10:09 AM 11/02/2022    3:32 PM 01/26/2022    8:08 AM 09/22/2021    8:24 AM 05/11/2020    7:00 PM  Depression screen PHQ 2/9  Decreased Interest 0 0 0 0 0  Down, Depressed, Hopeless 0 0 0 0 0  PHQ - 2 Score 0 0 0 0 0  Altered sleeping  0     Tired, decreased energy  0     Change in appetite  0     Feeling bad or failure about yourself   0     Trouble concentrating  0     Moving slowly or  fidgety/restless  0     Suicidal thoughts  0     PHQ-9 Score  0           02/21/2023   10:07 AM  Fall Risk   Falls in the past year? 0  Number falls in past yr: 0  Injury with Fall? 0  Risk for fall due to : No Fall Risks  Follow up Falls evaluation completed    Patient Care Team: Cyndi Drain, Kirby Peoples as PCP - General (Physician Assistant) Devon Fogo, [facility] (Inactive) (Pharmacist) Adegoroye, Genaro Kempf, MD (Specialist)   Review of Systems  Constitutional:  Positive for fatigue. Negative for appetite change and fever.  HENT:  Negative for congestion, ear pain, sinus pressure and sore throat.   Respiratory:  Negative for cough, chest tightness, shortness of breath and wheezing.   Cardiovascular:  Negative for chest pain and palpitations.  Gastrointestinal:  Negative for abdominal pain, constipation, diarrhea, nausea and vomiting.  Genitourinary:  Negative for dysuria and hematuria.  Musculoskeletal:  Negative for arthralgias, back pain, joint swelling and myalgias.  Skin:  Negative for rash.  Neurological:  Positive for dizziness, weakness and light-headedness. Negative for headaches.  Psychiatric/Behavioral:  Negative for dysphoric mood. The patient is not nervous/anxious.  Current Outpatient Medications on File Prior to Visit  Medication Sig Dispense Refill   allopurinol  (ZYLOPRIM ) 100 MG tablet TAKE 1 TABLET EVERY DAY 90 tablet 1   aspirin EC 81 MG tablet Take 81 mg by mouth daily.     bethanechol  (URECHOLINE ) 5 MG tablet Take 5 mg by mouth 2 (two) times daily.     Cholecalciferol (VITAMIN D -1000 MAX ST) 25 MCG (1000 UT) tablet Take 2,000 Units by mouth daily.     COLCRYS 0.6 MG tablet Take 0.6 mg by mouth as needed (gout flare ups).     Cyanocobalamin (B-12 PO) Take 1 tablet by mouth daily.     finasteride  (PROSCAR ) 5 MG tablet TAKE 1 TABLET (5 MG TOTAL) BY MOUTH DAILY. 90 tablet 3   gabapentin  (NEURONTIN ) 100 MG capsule Take 1 capsule (100 mg total) by mouth 2 (two)  times daily. 180 capsule 0   levothyroxine  (SYNTHROID ) 175 MCG tablet Take 175 mcg by mouth daily before breakfast.     LOKELMA  10 g PACK packet Take 10 g by mouth daily. 90 each 1   metFORMIN  (GLUCOPHAGE ) 500 MG tablet TAKE 1 TABLET TWICE DAILY 180 tablet 1   metoprolol  tartrate (LOPRESSOR ) 25 MG tablet TAKE 1 TABLET TWICE DAILY 180 tablet 1   nitroGLYCERIN (NITROSTAT) 0.4 MG SL tablet Place 0.4 mg under the tongue every 5 (five) minutes as needed for chest pain.     simvastatin  (ZOCOR ) 20 MG tablet TAKE 1 TABLET EVERY DAY 90 tablet 1   sodium bicarbonate 650 MG tablet Take 650 mg by mouth 2 (two) times daily.     No current facility-administered medications on file prior to visit.   Past Medical History:  Diagnosis Date   Acquired hypothyroidism 04/08/2018   Acute retention of urine 04/09/2018   Acute sinusitis 06/05/2020   AKI (acute kidney injury) (HCC) 04/08/2018   Arthralgia of both lower legs    Benign prostatic hyperplasia 12/05/2019   Bilateral impacted cerumen 12/01/2022   BMI 23.0-23.9, adult 04/21/2020   BPH (benign prostatic hyperplasia)    BPH with urinary obstruction 12/05/2019   CAD (coronary artery disease)    cardiologist--  dr Sandee Crook w/ [facility] in [place]   Chronic kidney disease (CKD) stage G3b/A1, moderately decreased glomerular filtration rate (GFR) between 30-44 mL/min/1.73 square meter and albuminuria creatinine ratio less than 30 mg/g (HCC) 07/06/2015   CKD (chronic kidney disease) stage 3, GFR 30-59 ml/min (HCC) 07/06/2015   CKD (chronic kidney disease), stage III (HCC)    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 DES OM 2012    2. two DES to circumflex coronary  artery 03/03/15   Coronary artery disease of bypass graft of native heart with stable angina pectoris (HCC) 04/09/2015   Overview:   1. DES OM 2012     2. 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  Formatting of this note might be different from the original.  per pt/ wife --  unknown when-- told this was on MRI in 2017     History of gout    History of kidney stones    History of stroke    per pt/ wife -- unknown when-- told this was on MRI in 2017   [facility] (hard of hearing)    Hydronephrosis 03/31/2021   Hyperkalemia 05/09/2020   Hyperlipemia, retention 04/09/2015   Hyperlipidemia    Hyperlipidemia associated with type 2 diabetes mellitus (HCC)    Hypertension    Hypertension associated with diabetes (HCC) 04/09/2015   Hypertrophic toenail 08/29/2023   Hypothyroidism    Malnutrition of mild degree (HCC) 04/29/2021   Need for vaccination for pneumococcus 02/21/2023   Personal history of urinary calculi 09/23/2020   Presence of coronary angioplasty implant and graft 09/23/2020   Formatting of this note might be different from the original.  Formatting of this note might be different from the original.  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   SIRS (systemic inflammatory response syndrome) (HCC) 04/08/2018   Type 2 diabetes mellitus (HCC) 04/08/2018   Type 2 diabetes mellitus with other specified complication (HCC) 09/23/2020   UTI (urinary tract infection) 04/08/2018   Vitamin D  deficiency 08/29/2023   Past Surgical History:   Procedure Laterality Date   CORONARY ANGIOPLASTY WITH STENT PLACEMENT  03/04/2015   x2 DES to CFX   CORONARY ANGIOPLASTY WITH STENT PLACEMENT  2012   x1 DES to OM   CYSTOSCOPY WITH INSERTION OF UROLIFT N/A 02/14/2017   Procedure: CYSTOSCOPY WITH INSERTION OF UROLIFT;  Surgeon: Marco Severs, MD;  Location[facility];  Service: Urology;  Laterality: N/A;   EXTRACORPOREAL SHOCK WAVE LITHOTRIPSY  1990's   PERCUTANEOUS NEPHROSTOLITHOTOMY  1991   and abdominal hernia repair   URETEROLITHOTOMY  1991 and 1994    Family History  Problem Relation Age of Onset   Heart disease Mother 79       After age 70 - CHF   Hypertension Father    Heart disease Father 25       After age 28 - CHF   Social History   Socioeconomic History   Marital status: Married    Spouse name: Not on file   Number of children: Not on file   Years of education: Not on file   Highest education level: Not on file  Occupational History   Occupation: truck driver  Tobacco Use   Smoking status: Never    Passive exposure: Never   Smokeless tobacco: Never  Vaping Use   Vaping status: Never Used  Substance and Sexual Activity   Alcohol  use: No   Drug use: No   Sexual activity: Yes    Partners: Female  Other Topics Concern   Not on file  Social History Narrative   Not on file   Social Drivers of Health   Financial Resource Strain: High Risk (08/18/2022)   Overall Financial Resource Strain (CARDIA)    Difficulty of Paying Living Expenses: Hard  Food Insecurity: No Food Insecurity (10/20/2023)   Received [facility]   Hunger Vital Sign    Worried About Running Out of Food in the Last Year: Never true    Ran Out of Food in the Last Year: Never true  Transportation  Needs: No Transportation Needs (10/20/2023)   Received from [facility]   PRAPARE - Administrator, Civil Service (Medical): No    Lack of Transportation (Non-Medical): No   Physical Activity: Not on file  Stress: Not on file  Social Connections: Not on file    Objective:  BP 138/78 (BP Location: Right Arm, Patient Position: Sitting)   Pulse 68   Temp 97.8 F (36.6 C) (Temporal)   Ht 5\' 9"  (1.753 m)   Wt 167 lb (75.8 kg)   SpO2 95%   BMI 24.66 kg/m      02/22/2024   10:11 AM 02/22/2024    9:51 AM 02/17/2024   10:41 AM  BP/Weight  Systolic BP 138 152 144  Diastolic BP 78 94 70  Wt. (Lbs)  167 164  BMI  24.66 kg/m2 24.22 kg/m2    Physical Exam Vitals reviewed.  Constitutional:      Appearance: Normal appearance.  Cardiovascular:     Rate and Rhythm: Normal rate and regular rhythm.     Heart sounds: Normal heart sounds.  Pulmonary:     Effort: Pulmonary effort is normal.     Breath sounds: Normal breath sounds.  Abdominal:     General: Bowel sounds are normal.     Palpations: Abdomen is soft.     Tenderness: There is no abdominal tenderness.  Neurological:     Mental Status: He is alert and oriented to person, place, and time.  Psychiatric:        Mood and Affect: Mood normal.        Behavior: Behavior normal.     Diabetic Foot Exam - Simple   No data filed      Lab Results  Component Value Date   WBC 9.8 02/17/2024   HGB 14.6 02/17/2024   HCT 44.5 02/17/2024   PLT 325 02/17/2024   GLUCOSE 94 02/17/2024   CHOL 147 08/29/2023   TRIG 65 08/29/2023   HDL 55 08/29/2023   LDLCALC 79 08/29/2023   ALT 9 02/17/2024   AST 15 02/17/2024   NA 142 02/17/2024   K 5.3 (H) 02/17/2024   CL 104 02/17/2024   CREATININE 1.65 (H) 02/17/2024   BUN 25 02/17/2024   CO2 24 02/17/2024   TSH 0.124 (L) 01/27/2024   HGBA1C 6.1 (H) 08/29/2023   MICROALBUR 80 12/23/2020      Assessment & Plan:  Combined arterial insufficiency and corporo-venous occlusive erectile dysfunction Assessment & Plan: Patient will consult with cardiology about his heart health and if medication is safe Make sure heart is healthy enough for sexual  activity. Discontinue Viagra     Essential hypertension Assessment & Plan: Hypertension with recent elevation to 152/94 mmHg. Stress and cardiac monitor issues may contribute. He does not regularly monitor blood pressure at home. Cardiology consult ongoing with cardiac monitor in use. - Encouraged increased water  intake to alleviate dizziness. - Monitor blood pressure at home regularly. - Consult cardiology for potential medication adjustments, considering Viagra 's ineffectiveness and interactions with nitroglycerin. - Await cardiac monitor results before medication changes. -Consult cardiology to make sure heart is healthy enough for sexual activity       No orders of the defined types were placed in this encounter.   No orders of the defined types were placed in this encounter.    Follow-up: No follow-ups on file.   I,Lauren M Auman,acting as a Neurosurgeon for [employer], [employer].,have documented all relevant documentation on the  behalf of Odilia Bennett, PA,as directed by  Odilia Bennett, PA while in the presence of Odilia Bennett, [place].   An After Visit Summary was printed and given to the patient.  Odilia Bennett, [place] [facility] [phone]

## 2024-02-21 NOTE — Telephone Encounter (Signed)
 Called the patient and his wife and instructed them to hold down the star button on the zio gateway and to hold it down until the lights that were orange and flashing turned green. The patient's wife completed these steps and she verified that after holding down the star symbol on the gateway the flashing lights turned from orange to green. The patient and his wife had no further questions at this time.

## 2024-02-22 ENCOUNTER — Telehealth: Payer: Self-pay | Admitting: Emergency Medicine

## 2024-02-22 ENCOUNTER — Ambulatory Visit (INDEPENDENT_AMBULATORY_CARE_PROVIDER_SITE_OTHER): Admitting: Physician Assistant

## 2024-02-22 ENCOUNTER — Encounter: Payer: Self-pay | Admitting: Physician Assistant

## 2024-02-22 VITALS — BP 138/78 | HR 68 | Temp 97.8°F | Ht 69.0 in | Wt 167.0 lb

## 2024-02-22 DIAGNOSIS — N5203 Combined arterial insufficiency and corporo-venous occlusive erectile dysfunction: Secondary | ICD-10-CM

## 2024-02-22 DIAGNOSIS — I10 Essential (primary) hypertension: Secondary | ICD-10-CM

## 2024-02-22 DIAGNOSIS — R55 Syncope and collapse: Secondary | ICD-10-CM | POA: Diagnosis not present

## 2024-02-22 NOTE — Telephone Encounter (Signed)
 Spoke to [place] per DPR   Reviewed lab results and recommendations per [facility] note. She verbalized understanding and had no further questions

## 2024-02-22 NOTE — Telephone Encounter (Signed)
 Called the patient and his wife and explained that I had called them yesterday to activate the gateway of his heart monitor, but I had not called them today. Patient verbalized understanding and had no further questions at this time.

## 2024-02-22 NOTE — Telephone Encounter (Signed)
 Wife Haskell Linker) returned RN Richard's call.

## 2024-02-22 NOTE — Telephone Encounter (Signed)
-----   Message from Terrance Ferretti sent at 02/18/2024  4:14 PM EDT ----- Stable kidney dysfunction.  Potassium elevated at 5.4, but he takes lokelma .   Tell him to make sure he is avoid salt supplementation (specifically NuSalt).

## 2024-02-28 NOTE — Assessment & Plan Note (Signed)
 Hypertension with recent elevation to 152/94 mmHg. Stress and cardiac monitor issues may contribute. He does not regularly monitor blood pressure at home. Cardiology consult ongoing with cardiac monitor in use. - Encouraged increased water  intake to alleviate dizziness. - Monitor blood pressure at home regularly. - Consult cardiology for potential medication adjustments, considering Viagra 's ineffectiveness and interactions with nitroglycerin. - Await cardiac monitor results before medication changes. -Consult cardiology to make sure heart is healthy enough for sexual activity

## 2024-02-28 NOTE — Assessment & Plan Note (Signed)
 Patient will consult with cardiology about his heart health and if medication is safe Make sure heart is healthy enough for sexual activity. Discontinue Viagra 

## 2024-03-02 ENCOUNTER — Telehealth: Payer: Self-pay | Admitting: Cardiology

## 2024-03-02 NOTE — Telephone Encounter (Signed)
 Wife Haskell Linker) stated patient has lost the return label to send back patient's [employer] monitor and the company has sent a replacement but they have not received it as yet and will be going out of town on Monday.  Patient is concerned about next steps.

## 2024-03-02 NOTE — Telephone Encounter (Signed)
 Called David Baxter per the patient's DPR and asked that they give it a couple of more days for the envelope to arrive so they can send the heart monitor back to [employer]. She stated that they would see if it arrives tomorrow and if it does then they would send it back. Patient had no further questions at this time.

## 2024-03-02 NOTE — Telephone Encounter (Signed)
 Left message for the patient to call back.

## 2024-03-07 ENCOUNTER — Ambulatory Visit: Payer: Medicare PPO | Admitting: Physician Assistant

## 2024-03-20 ENCOUNTER — Ambulatory Visit: Admitting: Physician Assistant

## 2024-03-26 ENCOUNTER — Other Ambulatory Visit: Payer: Self-pay | Admitting: Physician Assistant

## 2024-03-28 ENCOUNTER — Encounter: Payer: Self-pay | Admitting: Physician Assistant

## 2024-03-28 ENCOUNTER — Ambulatory Visit (INDEPENDENT_AMBULATORY_CARE_PROVIDER_SITE_OTHER): Admitting: Physician Assistant

## 2024-03-28 ENCOUNTER — Ambulatory Visit (HOSPITAL_BASED_OUTPATIENT_CLINIC_OR_DEPARTMENT_OTHER)
Admission: RE | Admit: 2024-03-28 | Discharge: 2024-03-28 | Disposition: A | Discharge status: Home / Self Care | Source: Ambulatory Visit | Attending: Physician Assistant | Admitting: Physician Assistant

## 2024-03-28 VITALS — BP 106/70 | HR 65 | Temp 97.6°F | Ht 69.0 in | Wt 163.8 lb

## 2024-03-28 DIAGNOSIS — E1122 Type 2 diabetes mellitus with diabetic chronic kidney disease: Secondary | ICD-10-CM | POA: Diagnosis not present

## 2024-03-28 DIAGNOSIS — N1832 Chronic kidney disease, stage 3b: Secondary | ICD-10-CM

## 2024-03-28 DIAGNOSIS — Z8739 Personal history of other diseases of the musculoskeletal system and connective tissue: Secondary | ICD-10-CM | POA: Diagnosis not present

## 2024-03-28 DIAGNOSIS — M545 Low back pain, unspecified: Secondary | ICD-10-CM

## 2024-03-28 DIAGNOSIS — M47816 Spondylosis without myelopathy or radiculopathy, lumbar region: Secondary | ICD-10-CM | POA: Diagnosis not present

## 2024-03-28 DIAGNOSIS — N183 Chronic kidney disease, stage 3 unspecified: Secondary | ICD-10-CM | POA: Diagnosis not present

## 2024-03-28 DIAGNOSIS — N401 Enlarged prostate with lower urinary tract symptoms: Secondary | ICD-10-CM | POA: Diagnosis not present

## 2024-03-28 DIAGNOSIS — E1159 Type 2 diabetes mellitus with other circulatory complications: Secondary | ICD-10-CM | POA: Diagnosis not present

## 2024-03-28 DIAGNOSIS — G8929 Other chronic pain: Secondary | ICD-10-CM

## 2024-03-28 DIAGNOSIS — M4316 Spondylolisthesis, lumbar region: Secondary | ICD-10-CM | POA: Diagnosis not present

## 2024-03-28 DIAGNOSIS — I25708 Atherosclerosis of coronary artery bypass graft(s), unspecified, with other forms of angina pectoris: Secondary | ICD-10-CM

## 2024-03-28 DIAGNOSIS — E559 Vitamin D deficiency, unspecified: Secondary | ICD-10-CM | POA: Diagnosis not present

## 2024-03-28 DIAGNOSIS — I152 Hypertension secondary to endocrine disorders: Secondary | ICD-10-CM

## 2024-03-28 DIAGNOSIS — E785 Hyperlipidemia, unspecified: Secondary | ICD-10-CM

## 2024-03-28 DIAGNOSIS — E039 Hypothyroidism, unspecified: Secondary | ICD-10-CM

## 2024-03-28 DIAGNOSIS — T148XXA Other injury of unspecified body region, initial encounter: Secondary | ICD-10-CM

## 2024-03-28 DIAGNOSIS — M5126 Other intervertebral disc displacement, lumbar region: Secondary | ICD-10-CM | POA: Diagnosis not present

## 2024-03-28 DIAGNOSIS — E1169 Type 2 diabetes mellitus with other specified complication: Secondary | ICD-10-CM | POA: Diagnosis not present

## 2024-03-28 DIAGNOSIS — I700 Atherosclerosis of aorta: Secondary | ICD-10-CM | POA: Diagnosis not present

## 2024-03-28 MED ORDER — MUPIROCIN 2 % EX OINT: 1.0000 | TOPICAL_OINTMENT | Freq: Two times a day (BID) | CUTANEOUS | 0 refills | Status: AC

## 2024-03-28 NOTE — Progress Notes (Addendum)
 Subjective:  Patient ID: David Baxter, male    DOB: 04/04/1941  Age: 83 y.o. MRN: [id number]  Chief Complaint  Patient presents with   Medical Management of Chronic Issues    HPI   Patient present with type 2 diabetes controlled without use of insuline.   Compliance with treatment has been good; patient take medicines as directed, maintains diet and exercise regimen, follows up as directed, His fasting glucose ranges 90s-110. Current medicines for diabetes Metformin  500 mg twice a day. Patient performs foot exams daily and last ophthalmologic exam was 4/25.   Patient presents with hyperlipidemia.  Compliance with treatment has been good; patient takes medicines as directed, maintains low cholesterol diet, follows up as directed, and maintains exercise regimen.  Patient is using Simvastatin  20 mg daily without problems.   Patient presents for follow up of hypertension.  Patient tolerating Metoprolol  25 mg twice a day, Aspirin 81 mg daily well without side effects. Patient is working on maintaining diet and exercise regimen and follows up as directed.  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 had 5 prior stents) - follows with Dr Sandee Crook (cardiology) regularly  Hypothyroidism: Patient is taking Levothyroxine  175 mcg daily. Due for labwork  GOUT: Taking Allopurinol  100 mg daily, Colcrys 0.6 mg PRN for gout flares, bethanechol  10 mg in the morning.  Pt with BPH - currently on Proscar  5mg  qd and follows with urologist Dr Claretta Croft regularly Pt also with history of chronic kidney diseases stage 3b - does follow with nephrologist Dr A in [facility] regularly - currently takes lokelma  10g pack and sodium bicarb 650mg   Pt does wear hearing aids with history of hearing loss  Pt complains of low back pain.  He states this has been a chronic problem for him intermittently for several months. Denies radiation of pain to legs Has not tried medication to help but wears a back brace when discomfort flares - would  like to get xray of lumbar spine  Pt has abrasion on right forearm - scraped skin and pulled back - no sign of infection - thinks hit on a piece of furniture Pt declines Tdap Current Outpatient Medications on File Prior to Visit  Medication Sig Dispense Refill   allopurinol  (ZYLOPRIM ) 100 MG tablet TAKE 1 TABLET EVERY DAY 90 tablet 1   aspirin EC 81 MG tablet Take 81 mg by mouth daily.     bethanechol  (URECHOLINE ) 5 MG tablet Take 5 mg by mouth 2 (two) times daily.     Cholecalciferol (VITAMIN D -1000 MAX ST) 25 MCG (1000 UT) tablet Take 2,000 Units by mouth daily.     COLCRYS 0.6 MG tablet Take 0.6 mg by mouth as needed (gout flare ups).     Cyanocobalamin (B-12 PO) Take 1 tablet by mouth daily.     finasteride  (PROSCAR ) 5 MG tablet TAKE 1 TABLET (5 MG TOTAL) BY MOUTH DAILY. 90 tablet 3   gabapentin  (NEURONTIN ) 100 MG capsule Take 1 capsule (100 mg total) by mouth 2 (two) times daily. 180 capsule 0   levothyroxine  (SYNTHROID ) 175 MCG tablet Take 175 mcg by mouth daily before breakfast.     LOKELMA  10 g PACK packet Take 10 g by mouth daily. 90 each 1   metFORMIN  (GLUCOPHAGE ) 500 MG tablet TAKE 1 TABLET TWICE DAILY 180 tablet 1   metoprolol  tartrate (LOPRESSOR ) 25 MG tablet TAKE 1 TABLET TWICE DAILY 180 tablet 1   nitroGLYCERIN (NITROSTAT) 0.4 MG SL tablet Place 0.4 mg under the tongue  every 5 (five) minutes as needed for chest pain.     simvastatin  (ZOCOR ) 20 MG tablet TAKE 1 TABLET EVERY DAY 90 tablet 1   sodium bicarbonate 650 MG tablet Take 650 mg by mouth 2 (two) times daily.     No current facility-administered medications on file prior to visit.   Past Medical History:  Diagnosis Date   Acquired hypothyroidism 04/08/2018   Acute retention of urine 04/09/2018   Acute sinusitis 06/05/2020   AKI (acute kidney injury) (HCC) 04/08/2018   Arthralgia of both lower legs    Benign prostatic hyperplasia 12/05/2019   Bilateral impacted cerumen 12/01/2022   BMI 23.0-23.9, adult 04/21/2020    BPH (benign prostatic hyperplasia)    BPH with urinary obstruction 12/05/2019   CAD (coronary artery disease)    cardiologist--  dr Sandee Crook w/ [facility] in [place]   Chronic kidney disease (CKD) stage G3b/A1, moderately decreased glomerular filtration rate (GFR) between 30-44 mL/min/1.73 square meter and albuminuria creatinine ratio less than 30 mg/g (HCC) 07/06/2015   CKD (chronic kidney disease) stage 3, GFR 30-59 ml/min (HCC) 07/06/2015   CKD (chronic kidney disease), stage III (HCC)    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 DES OM 2012    2. two DES to circumflex coronary artery 03/03/15   Coronary artery disease of bypass graft of native heart with stable angina pectoris (HCC) 04/09/2015   Overview:   1. DES OM 2012     2. 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  Formatting of this note might be different from the original.  per pt/ wife -- unknown when-- told this was on MRI in 2017     History of gout    History of kidney stones    History of stroke    per pt/ wife -- unknown when-- told this was on MRI in 2017   [facility] (hard of hearing)    Hydronephrosis 03/31/2021   Hyperkalemia 05/09/2020   Hyperlipemia, retention 04/09/2015   Hyperlipidemia    Hyperlipidemia associated with type 2 diabetes mellitus (HCC)    Hypertension    Hypertension associated with diabetes (HCC) 04/09/2015    Hypertrophic toenail 08/29/2023   Hypothyroidism    Malnutrition of mild degree (HCC) 04/29/2021   Need for vaccination for pneumococcus 02/21/2023   Personal history of urinary calculi 09/23/2020   Presence of coronary angioplasty implant and graft 09/23/2020   Formatting of this note might be different from the original.  Formatting of this note might be different from the original.  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   SIRS (systemic inflammatory response syndrome) (HCC) 04/08/2018   Type 2 diabetes mellitus (HCC) 04/08/2018   Type 2 diabetes mellitus with other specified complication (HCC) 09/23/2020  UTI (urinary tract infection) 04/08/2018   Vitamin D  deficiency 08/29/2023   Past Surgical History:  Procedure Laterality Date   CORONARY ANGIOPLASTY WITH STENT PLACEMENT  03/04/2015   x2 DES to CFX   CORONARY ANGIOPLASTY WITH STENT PLACEMENT  2012   x1 DES to OM   CYSTOSCOPY WITH INSERTION OF UROLIFT N/A 02/14/2017   Procedure: CYSTOSCOPY WITH INSERTION OF UROLIFT;  Surgeon: Marco Severs, MD;  Location: [facility];  Service: Urology;  Laterality: N/A;   EXTRACORPOREAL SHOCK WAVE LITHOTRIPSY  1990's   PERCUTANEOUS NEPHROSTOLITHOTOMY  1991   and abdominal hernia repair   URETEROLITHOTOMY  1991 and 1994    Family History  Problem Relation Age of Onset   Heart disease Mother 51       After age 28 - CHF   Hypertension Father    Heart disease Father 11       After age 19 - CHF   Social History   Socioeconomic History   Marital status: Married    Spouse name: Not on file   Number of children: Not on file   Years of education: Not on file   Highest education level: Not on file  Occupational History   Occupation: truck driver  Tobacco Use   Smoking status: Never    Passive exposure: Never    Smokeless tobacco: Never  Vaping Use   Vaping status: Never Used  Substance and Sexual Activity   Alcohol  use: No   Drug use: No   Sexual activity: Yes    Partners: Female  Other Topics Concern   Not on file  Social History Narrative   Not on file   Social Drivers of Health   Financial Resource Strain: High Risk (08/18/2022)   Overall Financial Resource Strain (CARDIA)    Difficulty of Paying Living Expenses: Hard  Food Insecurity: No Food Insecurity (10/20/2023)   Received from [facility]   Hunger Vital Sign    Worried About Running Out of Food in the Last Year: Never true    Ran Out of Food in the Last Year: Never true  Transportation Needs: No Transportation Needs (10/20/2023)   Received from [facility]   PRAPARE - Administrator, Civil Service (Medical): No    Lack of Transportation (Non-Medical): No  Physical Activity: Not on file  Stress: Not on file  Social Connections: Not on file    CONSTITUTIONAL: Negative for chills, fatigue, fever, unintentional weight gain and unintentional weight loss.  E/N/T: Negative for ear pain, nasal congestion and sore throat.  CARDIOVASCULAR: Negative for chest pain, dizziness, palpitations and pedal edema.  RESPIRATORY: Negative for recent cough and dyspnea.  GASTROINTESTINAL: Negative for abdominal pain, acid reflux symptoms, constipation, diarrhea, nausea and vomiting.  MSK: see HPI INTEGUMENTARY: see HPI NEUROLOGICAL: Negative for dizziness and headaches.  PSYCHIATRIC: Negative for sleep disturbance and to question depression screen.  Negative for depression, negative for anhedonia.       Objective:  PHYSICAL EXAM:   VS: BP 106/70   Pulse 65   Temp 97.6 F (36.4 C)   Ht 5' 9 (1.753 m)   Wt 163 lb 12.8 oz (74.3 kg)   SpO2 97%   BMI 24.19 kg/m   GEN: Well nourished, well developed, in no acute distress   Cardiac: RRR; no murmurs, rubs, or gallops,no edema   Respiratory:  normal respiratory rate and pattern with no distress - normal breath sounds with  no rales, rhonchi, wheezes or rubs  MS: no deformity or atrophy - tender to lumbar spine Skin: abrasion on right forearm Neuro:  Alert and Oriented x 3, Strength and sensation are intact - CN II-Xii grossly intact Psych: euthymic mood, appropriate affect and demeanor   Lab Results  Component Value Date   WBC 9.8 02/17/2024   HGB 14.6 02/17/2024   HCT 44.5 02/17/2024   PLT 325 02/17/2024   GLUCOSE 94 02/17/2024   CHOL 147 08/29/2023   TRIG 65 08/29/2023   HDL 55 08/29/2023   LDLCALC 79 08/29/2023   ALT 9 02/17/2024   AST 15 02/17/2024   NA 142 02/17/2024   K 5.3 (H) 02/17/2024   CL 104 02/17/2024   CREATININE 1.65 (H) 02/17/2024   BUN 25 02/17/2024   CO2 24 02/17/2024   TSH 0.124 (L) 01/27/2024   HGBA1C 6.1 (H) 08/29/2023   MICROALBUR 80 12/23/2020      Assessment & Plan:   Problem List Items Addressed This Visit       Cardiovascular and Mediastinum   Coronary artery disease of bypass graft of native heart with stable angina pectoris (HCC) Continue current meds Follow up with cardiology as directed     Hypertension associated with diabetes (HCC) Continue current meds     Endocrine   Controlled type 2 diabetes with stabe 3 chronic kidney disease without long term use of insulin(HCC) Conitnue meds as directed Watch diet Labwork pending      Other   Hyperlipidemia associated with type 2 diabetes (HCC) Continue med  Watch diet  History of gout Uric acid level pending Continue meds  Chronic kidney disease stage 3b Continue meds and follow up with nephrology  Low back pain Alternate heat, ice Tylenol  as needed Lumbar spine xray ordered  Hypothyroidism TSH pending Continue levothyroxine   BPH Continue meds and follow up with urologist as scheduled  Abrasion arm Rx bactroban   Other Visit Diagnoses       .    Orders Placed This Encounter   Procedures   DG Lumbar Spine Complete   CBC with Differential/Platelet   Comprehensive metabolic panel with GFR   TSH   Lipid panel   Hemoglobin A1c   VITAMIN D  25 Hydroxy (Vit-D Deficiency, Fractures)   Uric acid     Follow-up: Return in about 6 months (around 09/27/2024) for chronic fasting follow-up and in June for [facility] wellness.  An After Visit Summary was printed and given to the patient.  Anthonette Bastos [facility] [phone]

## 2024-03-29 ENCOUNTER — Other Ambulatory Visit: Payer: Self-pay | Admitting: Physician Assistant

## 2024-03-29 ENCOUNTER — Ambulatory Visit: Payer: Self-pay | Admitting: Physician Assistant

## 2024-03-29 DIAGNOSIS — E039 Hypothyroidism, unspecified: Secondary | ICD-10-CM

## 2024-03-29 LAB — COMPREHENSIVE METABOLIC PANEL WITH GFR
ALT: 7 IU/L (ref 0–44)
AST: 16 IU/L (ref 0–40)
Albumin: 4.2 g/dL (ref 3.7–4.7)
Alkaline Phosphatase: 93 IU/L (ref 44–121)
BUN/Creatinine Ratio: 13 (ref 10–24)
BUN: 22 mg/dL (ref 8–27)
Bilirubin Total: 0.5 mg/dL (ref 0.0–1.2)
CO2: 24 mmol/L (ref 20–29)
Calcium: 9.7 mg/dL (ref 8.6–10.2)
Chloride: 102 mmol/L (ref 96–106)
Creatinine, Ser: 1.7 mg/dL — ABNORMAL HIGH (ref 0.76–1.27)
Globulin, Total: 2.6 g/dL (ref 1.5–4.5)
Glucose: 115 mg/dL — ABNORMAL HIGH (ref 70–99)
Potassium: 5 mmol/L (ref 3.5–5.2)
Sodium: 143 mmol/L (ref 134–144)
Total Protein: 6.8 g/dL (ref 6.0–8.5)
eGFR: 39 mL/min/{1.73_m2} — ABNORMAL LOW (ref >59)

## 2024-03-29 LAB — CBC WITH DIFFERENTIAL/PLATELET
Basophils Absolute: 0.1 10*3/uL (ref 0.0–0.2)
Basos: 1 %
EOS (ABSOLUTE): 0.6 10*3/uL — ABNORMAL HIGH (ref 0.0–0.4)
Eos: 6 %
Hematocrit: 48.5 % (ref 37.5–51.0)
Hemoglobin: 15.4 g/dL (ref 13.0–17.7)
Immature Grans (Abs): 0 10*3/uL (ref 0.0–0.1)
Immature Granulocytes: 0 %
Lymphocytes Absolute: 3.1 10*3/uL (ref 0.7–3.1)
Lymphs: 31 %
MCH: 29.4 pg (ref 26.6–33.0)
MCHC: 31.8 g/dL (ref 31.5–35.7)
MCV: 93 fL (ref 79–97)
Monocytes Absolute: 1.1 10*3/uL — ABNORMAL HIGH (ref 0.1–0.9)
Monocytes: 11 %
Neutrophils Absolute: 5 10*3/uL (ref 1.4–7.0)
Neutrophils: 51 %
Platelets: 376 10*3/uL (ref 150–450)
RBC: 5.24 x10E6/uL (ref 4.14–5.80)
RDW: 14 % (ref 11.6–15.4)
WBC: 9.9 10*3/uL (ref 3.4–10.8)

## 2024-03-29 LAB — LIPID PANEL
Chol/HDL Ratio: 2.9 ratio (ref 0.0–5.0)
Cholesterol, Total: 136 mg/dL (ref 100–199)
HDL: 47 mg/dL (ref >39)
LDL Chol Calc (NIH): 74 mg/dL (ref 0–99)
Triglycerides: 78 mg/dL (ref 0–149)
VLDL Cholesterol Cal: 15 mg/dL (ref 5–40)

## 2024-03-29 LAB — TSH: TSH: 0.121 u[IU]/mL — ABNORMAL LOW (ref 0.450–4.500)

## 2024-03-29 LAB — VITAMIN D 25 HYDROXY (VIT D DEFICIENCY, FRACTURES): Vit D, 25-Hydroxy: 50.3 ng/mL (ref 30.0–100.0)

## 2024-03-29 LAB — URIC ACID: Uric Acid: 6.3 mg/dL (ref 3.8–8.4)

## 2024-03-29 LAB — HEMOGLOBIN A1C
Est. average glucose Bld gHb Est-mCnc: 126 mg/dL
Hgb A1c MFr Bld: 6 % — ABNORMAL HIGH (ref 4.8–5.6)

## 2024-03-29 MED ORDER — LEVOTHYROXINE SODIUM 150 MCG PO TABS
150.0000 ug | ORAL_TABLET | Freq: Every day | ORAL | 3 refills | Status: DC
Start: 2024-03-29 — End: 2024-03-29

## 2024-03-29 MED ORDER — LEVOTHYROXINE SODIUM 137 MCG PO TABS: 137.0000 ug | ORAL_TABLET | Freq: Every day | ORAL | 2 refills | Status: DC

## 2024-04-02 ENCOUNTER — Ambulatory Visit: Payer: Self-pay | Admitting: Cardiology

## 2024-04-02 DIAGNOSIS — R55 Syncope and collapse: Secondary | ICD-10-CM | POA: Diagnosis not present

## 2024-04-02 NOTE — Telephone Encounter (Signed)
-----   Message from Terrance Ferretti sent at 04/02/2024  3:58 PM EDT ----- The monitor you wore revealed that on average your heart rate is 67 bpm, this is normal, you were predominantly in sinus rhythm, this is also normal.  You did have 2 episodes where your heart rate  sped up however this is not abnormal and would not have caused you to pass out.  I do not think there are any contributory causes on this monitor that we will concern your episode of passing out,  most likely your episode of passing out was related to slight dehydration and position change.  Overall this is a reassuring monitor result. ----- Message ----- From: Manfred Seed, MD Sent: 04/02/2024  10:24 AM EDT To: Terrance Ferretti, NP

## 2024-04-02 NOTE — Telephone Encounter (Signed)
 Informed pt's wife of the heart monitor results. She verbalized understanding and had no further questions.

## 2024-04-12 ENCOUNTER — Encounter: Payer: Self-pay | Admitting: Family Medicine

## 2024-04-12 ENCOUNTER — Ambulatory Visit: Admitting: Family Medicine

## 2024-04-12 VITALS — BP 156/72

## 2024-04-12 DIAGNOSIS — Z0000 Encounter for general adult medical examination without abnormal findings: Secondary | ICD-10-CM | POA: Diagnosis not present

## 2024-04-12 NOTE — Progress Notes (Signed)
 Subjective:   David Baxter is a 83 y.o. male who presents for Medicare Annual/Subsequent preventive examination.  Visit Complete: Virtual I connected with  David Baxter on 04/12/24 by a audio enabled telemedicine application and verified that I am speaking with the correct person using two identifiers.  Patient Location: Home  Provider Location: Office/Clinic  I discussed the limitations of evaluation and management by telemedicine. The patient expressed understanding and agreed to proceed.  Vital Signs: Because this visit was a virtual/telehealth visit, some criteria may be missing or patient reported. Any vitals not documented were not able to be obtained and vitals that have been documented are patient reported.  Patient Medicare AWV questionnaire was completed by the patient on 04/12/2024; I have confirmed that all information answered by patient is correct and no changes since this date.  Cardiac Risk Factors include: advanced age (>22men, >35 women);diabetes mellitus;male gender     Objective:    Today's Vitals   04/11/24 1016 04/12/24 0942  BP: (!) 156/72   PainSc:  8    There is no height or weight on file to calculate BMI.     05/13/2020   11:46 AM 05/11/2020    6:59 PM 02/14/2017   11:00 AM 12/01/2015   10:24 AM  Advanced Directives  Does Patient Have a Medical Advance Directive?  Yes Yes  No   Type of Estate agent of [place];Living will Healthcare Power of [place];Living will Living will   Does patient want to make changes to medical advance directive?  No - Patient declined No - Patient declined    Copy of Healthcare Power of Attorney in Chart? Yes - validated most recent copy scanned in chart (See row information) No - copy requested       Data saved with a previous flowsheet row definition    Current Medications (verified) Outpatient Encounter Medications as of 04/12/2024  Medication Sig   allopurinol  (ZYLOPRIM ) 100 MG tablet TAKE  1 TABLET EVERY DAY   aspirin EC 81 MG tablet Take 81 mg by mouth daily.   bethanechol  (URECHOLINE ) 5 MG tablet Take 5 mg by mouth 2 (two) times daily.   Cholecalciferol (VITAMIN D -1000 MAX ST) 25 MCG (1000 UT) tablet Take 2,000 Units by mouth daily.   COLCRYS 0.6 MG tablet Take 0.6 mg by mouth as needed (gout flare ups).   Cyanocobalamin (B-12 PO) Take 1 tablet by mouth daily.   finasteride  (PROSCAR ) 5 MG tablet TAKE 1 TABLET (5 MG TOTAL) BY MOUTH DAILY.   gabapentin  (NEURONTIN ) 100 MG capsule Take 1 capsule (100 mg total) by mouth 2 (two) times daily.   levothyroxine  (SYNTHROID ) 137 MCG tablet Take 1 tablet (137 mcg total) by mouth daily before breakfast.   LOKELMA  10 g PACK packet Take 10 g by mouth daily.   metFORMIN  (GLUCOPHAGE ) 500 MG tablet TAKE 1 TABLET TWICE DAILY   metoprolol  tartrate (LOPRESSOR ) 25 MG tablet TAKE 1 TABLET TWICE DAILY   mupirocin  ointment (BACTROBAN ) 2 % Apply 1 Application topically 2 (two) times daily.   nitroGLYCERIN (NITROSTAT) 0.4 MG SL tablet Place 0.4 mg under the tongue every 5 (five) minutes as needed for chest pain.   simvastatin  (ZOCOR ) 20 MG tablet TAKE 1 TABLET EVERY DAY   sodium bicarbonate 650 MG tablet Take 650 mg by mouth 2 (two) times daily.   No facility-administered encounter medications on file as of 04/12/2024.    Allergies (verified) Tamsulosin and Aspirin   History: Past Medical History:  Diagnosis Date   Acquired hypothyroidism 04/08/2018   Acute retention of urine 04/09/2018   Acute sinusitis 06/05/2020   AKI (acute kidney injury) (HCC) 04/08/2018   Arthralgia of both lower legs    Benign prostatic hyperplasia 12/05/2019   Bilateral impacted cerumen 12/01/2022   BMI 23.0-23.9, adult 04/21/2020   BPH (benign prostatic hyperplasia)    BPH with urinary obstruction 12/05/2019   CAD (coronary artery disease)    cardiologist--  dr monetta w/ [facility] in [place]   Chronic kidney disease (CKD) stage G3b/A1, moderately  decreased glomerular filtration rate (GFR) between 30-44 mL/min/1.73 square meter and albuminuria creatinine ratio less than 30 mg/g (HCC) 07/06/2015   CKD (chronic kidney disease) stage 3, GFR 30-59 ml/min (HCC) 07/06/2015   CKD (chronic kidney disease), stage III (HCC)    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 DES OM 2012    2. two DES to circumflex coronary artery 03/03/15   Coronary artery disease of bypass graft of native heart with stable angina pectoris (HCC) 04/09/2015   Overview:   1. DES OM 2012     2. 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  Formatting of this note might be different from the original.  per pt/ wife -- unknown when-- told this was on MRI in 2017     History of gout    History of kidney stones    History of stroke    per pt/ wife -- unknown when-- told this was on MRI in 2017   [facility] (hard of hearing)    Hydronephrosis 03/31/2021   Hyperkalemia 05/09/2020   Hyperlipemia, retention 04/09/2015   Hyperlipidemia    Hyperlipidemia associated with type 2 diabetes mellitus (HCC)    Hypertension    Hypertension associated with diabetes (HCC) 04/09/2015   Hypertrophic toenail 08/29/2023   Hypothyroidism    Malnutrition of mild degree (HCC) 04/29/2021   Need for vaccination for pneumococcus 02/21/2023   Personal history of urinary calculi 09/23/2020   Presence of coronary angioplasty  implant and graft 09/23/2020   Formatting of this note might be different from the original.  Formatting of this note might be different from the original.  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   SIRS (systemic inflammatory response syndrome) (HCC) 04/08/2018   Type 2 diabetes mellitus (HCC) 04/08/2018   Type 2 diabetes mellitus with other specified complication (HCC) 09/23/2020   UTI (urinary tract infection) 04/08/2018   Vitamin D  deficiency 08/29/2023   Past Surgical History:  Procedure Laterality Date   CORONARY ANGIOPLASTY WITH STENT PLACEMENT  03/04/2015   x2 DES to CFX   CORONARY ANGIOPLASTY WITH STENT PLACEMENT  2012   x1 DES to OM   CYSTOSCOPY WITH INSERTION OF UROLIFT N/A 02/14/2017  Procedure: CYSTOSCOPY WITH INSERTION OF UROLIFT;  Surgeon: Belvie LITTIE Clara, MD;  Location[facility];  Service: Urology;  Laterality: N/A;   EXTRACORPOREAL SHOCK WAVE LITHOTRIPSY  1990's   PERCUTANEOUS NEPHROSTOLITHOTOMY  1991   and abdominal hernia repair   URETEROLITHOTOMY  1991 and 1994   Family History  Problem Relation Age of Onset   Heart disease Mother 35       After age 74 - CHF   Hypertension Father    Heart disease Father 7       After age 55 - CHF   Social History   Socioeconomic History   Marital status: Married    Spouse name: Not on file   Number of children: Not on file   Years of education: Not on file   Highest education level: Not on file  Occupational History   Occupation: truck driver  Tobacco Use   Smoking status: Never    Passive exposure: Never   Smokeless tobacco: Never  Vaping Use   Vaping status: Never Used  Substance and Sexual Activity   Alcohol  use: No   Drug use: No   Sexual activity: Yes    Partners: Female  Other Topics Concern   Not on file  Social History Narrative    Not on file   Social Drivers of Health   Financial Resource Strain: High Risk (08/18/2022)   Overall Financial Resource Strain (CARDIA)    Difficulty of Paying Living Expenses: Hard  Food Insecurity: No Food Insecurity (10/20/2023)   Received [facility]   Hunger Vital Sign    Within the past 12 months, you worried that your food would run out before you got the money to buy more.: Never true    Within the past 12 months, the food you bought just didn't last and you didn't have money to get more.: Never true  Transportation Needs: No Transportation Needs (10/20/2023)   [facility]   PRAPARE - Administrator, Civil Service (Medical): No    Lack of Transportation (Non-Medical): No  Physical Activity: Not on file  Stress: Not on file  Social Connections: Not on file    Tobacco Counseling Counseling given: Not Answered   Clinical Intake:  Pre-visit preparation completed: Yes  Pain : 0-10 Pain Score: 8  Pain Type: Chronic pain Pain Location: Back Pain Descriptors / Indicators: Aching Pain Onset: Other (comment) (YEARS AGO - ARTHRITIS) Pain Frequency: Intermittent     Diabetes: Yes CBG done?: No Did pt. bring in CBG monitor from home?: No  How often do you need to have someone help you when you read instructions, pamphlets, or other written materials from your doctor or pharmacy?: 2 - Rarely What is the last grade level you completed in school?: 11TH GRADE  Interpreter Needed?: No      Activities of Daily Living    04/12/2024    9:47 AM  In your present state of health, do you have any difficulty performing the following activities:  Hearing? 1  Vision? 0  Difficulty concentrating or making decisions? 1  Walking or climbing stairs? 0  Dressing or bathing? 1  Doing errands, shopping? 1  Preparing Food and eating ? N  Using the Toilet? N  In the past six months, have you accidently leaked  urine? N  Do you have problems with loss of bowel control? N  Managing your Medications? Y  Comment LETS HIS  WIFE TAKE CARE OF MEDS  Managing your Finances? Y  Comment WIFE HANDLES THIS  Housekeeping or managing your Housekeeping? Y  Comment WIFE HANDLES THIS    Patient Care Team: Nicholaus Credit, DEVONNA as PCP - General (Physician Assistant) Nyle Rankin POUR, [facility] (Inactive) (Pharmacist) Adegoroye, Acie LABOR, MD (Specialist)  Indicate any recent Medical Services you may have received from other than [facility] providers in the past year (date may be approximate).     Assessment:   This is a routine wellness examination for Letha.  Hearing/Vision screen No results found.   Goals Addressed             This Visit's Progress    Manage My Medicine       Timeframe:  Long-Range Goal Priority:  High Start Date:         04/12/24                     Expected End Date:  04/12/25                    Follow Up Date 05/27/2021    - call for medicine refill 2 or 3 days before it runs out - keep a list of all the medicines I take; vitamins and herbals too - use a pillbox to sort medicine    Why is this important?   These steps will help you keep on track with your medicines.   Notes:  His wife manages his medications for him.     Track and Manage My Blood Pressure-Hypertension       Timeframe:  Long-Range Goal Priority:  High Start Date:     04/12/24               Expected End Date:   04/12/25                  Follow Up Date 05/27/2022    - check blood pressure weekly - choose a place to take my blood pressure (home, clinic or office, retail store) - write blood pressure results in a log or diary    Why is this important?   You won't feel high blood pressure, but it can still hurt your blood vessels.  High blood pressure can cause heart or kidney problems. It can also cause a stroke.  Making lifestyle changes like losing a little weight or eating less salt will help.  Checking  your blood pressure at home and at different times of the day can help to control blood pressure.  If the doctor prescribes medicine remember to take it the way the doctor ordered.  Call the office if you cannot afford the medicine or if there are questions about it.     Notes:        Depression Screen    04/12/2024    9:53 AM 03/28/2024    9:44 AM 02/21/2023   10:09 AM 11/02/2022    3:32 PM 01/26/2022    8:08 AM 09/22/2021    8:24 AM 05/11/2020    7:00 PM  PHQ 2/9 Scores  PHQ - 2 Score 0 0 0 0 0 0 0  PHQ- 9 Score    0       Fall Risk    04/12/2024    9:52 AM 03/28/2024    9:44 AM 02/21/2023   10:07 AM 11/02/2022    3:32 PM 01/26/2022    8:07 AM  Fall Risk  Falls in the past year? 0 0 0 0 0  Number falls in past yr: 0 0 0 0 0  Injury with Fall? 0 0 0 0 0  Risk for fall due to : No Fall Risks No Fall Risks No Fall Risks No Fall Risks   Follow up Falls evaluation completed  Falls evaluation completed Falls evaluation completed       Data saved with a previous flowsheet row definition    MEDICARE RISK AT HOME: Medicare Risk at Home Any stairs in or around the home?: Yes If so, are there any without handrails?: Yes Home free of loose throw rugs in walkways, pet beds, electrical cords, etc?: Yes Adequate lighting in your home to reduce risk of falls?: Yes Life alert?: Yes Use of a cane, walker or w/c?: No Grab bars in the bathroom?: Yes Shower chair or bench in shower?: Yes Elevated toilet seat or a handicapped toilet?: Yes  TIMED UP AND GO:  Was the test performed?  No    Cognitive Function:    12/23/2020    8:16 AM  MMSE - Mini Mental State Exam  Orientation to time 4  Orientation to Place 5  Registration 3  Attention/ Calculation 5  Recall 3  Language- name 2 objects 2  Language- repeat 0  Language- follow 3 step command 2  Language- read & follow direction 1  Write a sentence 1  Copy design 1  Total score 27        04/12/2024    9:53 AM 12/23/2020    8:14  AM 05/11/2020    7:02 PM  6CIT Screen  What Year? 0 points 0 points 0 points  What month? 0 points 0 points 0 points  What time? 0 points 3 points 0 points  Count back from 20 0 points 0 points 0 points  Months in reverse 2 points 4 points 0 points  Repeat phrase 0 points 8 points 0 points  Total Score 2 points 15 points 0 points    Immunizations Immunization History  Administered Date(s) Administered   Fluad Quad(high Dose 65+) 08/19/2020, 09/22/2021, 09/29/2022   Fluad Trivalent(High Dose 65+) 08/29/2023   Moderna Covid-19 Vaccine Bivalent Booster 40yrs & up 09/22/2021   Moderna SARS-COV2 Booster Vaccination 08/19/2020   Moderna Sars-Covid-2 Vaccination 11/22/2019, 12/18/2019   PNEUMOCOCCAL CONJUGATE-20 02/21/2023   Pfizer(Comirnaty)Fall Seasonal Vaccine 12 years and older 08/29/2023    TDAP status: Due, Education has been provided regarding the importance of this vaccine. Advised may receive this vaccine at local pharmacy or Health Dept. Aware to provide a copy of the vaccination record if obtained from local pharmacy or Health Dept. Verbalized acceptance and understanding.  Flu Vaccine status: Up to date  Pneumococcal vaccine status: Up to date  Covid-19 vaccine status: Declined, Education has been provided regarding the importance of this vaccine but patient still declined. Advised may receive this vaccine at local pharmacy or Health Dept.or vaccine clinic. Aware to provide a copy of the vaccination record if obtained from local pharmacy or Health Dept. Verbalized acceptance and understanding.  Qualifies for Shingles Vaccine? Yes   Zostavax completed No   Shingrix Completed?: No.    Education has been provided regarding the importance of this vaccine. Patient has been advised to call insurance company to determine out of pocket expense if they have not yet received this vaccine. Advised may also receive vaccine at local pharmacy or Health Dept. Verbalized acceptance and  understanding.  Screening Tests Health Maintenance  Topic Date Due   COVID-19 Vaccine (6 - 2024-25 season) 04/13/2024 (Originally 02/26/2024)   INFLUENZA VACCINE  05/18/2024   Diabetic kidney evaluation - Urine ACR  08/28/2024   FOOT EXAM  08/28/2024   HEMOGLOBIN A1C  09/27/2024   OPHTHALMOLOGY EXAM  01/16/2025   Diabetic kidney evaluation - eGFR measurement  03/28/2025   Medicare Annual Wellness (AWV)  04/12/2025   Pneumococcal Vaccine: 50+ Years  Completed   Hepatitis B Vaccines  Aged Out   HPV VACCINES  Aged Out   Meningococcal B Vaccine  Aged Out   DTaP/Tdap/Td  Discontinued   Zoster Vaccines- Shingrix  Discontinued    Health Maintenance  There are no preventive care reminders to display for this patient.   Colorectal cancer screening: No longer required.   Lung Cancer Screening: (Low Dose CT Chest recommended if Age 61-80 years, 20 pack-year currently smoking OR have quit w/in 15years.) does not qualify.   Lung Cancer Screening Referral: N/A  Additional Screening:  Hepatitis C Screening: does not qualify; Completed N/A  Vision Screening: Recommended annual ophthalmology exams for early detection of glaucoma and other disorders of the eye. Is the patient up to date with their annual eye exam?  Yes  Who is the provider or what is the name of the office in which the patient attends annual eye exams? DR MALVINA AT [facility] If pt is not established with a provider, would they like to be referred to a provider to establish care? N/A.   Dental Screening: Recommended annual dental exams for proper oral hygiene  Diabetic Foot Exam: Diabetic Foot Exam: Completed 08/29/23  Community Resource Referral / Chronic Care Management: CRR required this visit?  No   CCM required this visit?  No     Plan:    Encounter for Medicare annual wellness exam Things to do to keep yourself healthy  - Exercise at least 30-45 minutes a day, 3-4 days a week.  - Eat a low-fat diet  with lots of fruits and vegetables, up to 7-9 servings per day.  - Seatbelts can save your life. Wear them always.  - Smoke detectors on every level of your home, check batteries every year.  - Eye Doctor - have an eye exam every 1-2 years  - Alcohol  -  If you drink, do it moderately, less than 2 drinks per day.  - Health Care Power of Attorney. Choose someone to speak for you if you are not able.  - Depression is common in our stressful world.If you're feeling down or losing interest in things you normally enjoy, please come in for a visit.  - Violence - If anyone is threatening or hurting you, please call immediately.   I have personally reviewed and noted the following in the patient's chart:   Medical and social history Use of alcohol , tobacco or illicit drugs  Current medications and supplements including opioid prescriptions. Patient is currently taking opioid prescriptions. Information provided to patient regarding non-opioid alternatives. Patient advised to discuss non-opioid treatment plan with their provider. Functional ability and status Nutritional status Physical activity Advanced directives List of other physicians Hospitalizations, surgeries, and ER visits in previous 12 months Vitals Screenings to include cognitive, depression, and falls Referrals and appointments  In addition, I have reviewed and discussed with patient certain preventive protocols, quality metrics, and best practice recommendations. A written personalized care plan for preventive services as well as general preventive health recommendations were provided to patient.     Harrie  Teressa, FNP [facility] [phone]     04/12/2024   After Visit Summary: (Declined) Due to this being a telephonic visit, with patients personalized plan was offered to patient but patient Declined AVS at this time

## 2024-04-12 NOTE — Assessment & Plan Note (Signed)
 Things to do to keep yourself healthy  - Exercise at least 30-45 minutes a day, 3-4 days a week.  - Eat a low-fat diet with lots of fruits and vegetables, up to 7-9 servings per day.  - Seatbelts can save your life. Wear them always.  - Smoke detectors on every level of your home, check batteries every year.  - Eye Doctor - have an eye exam every 1-2 years   - Alcohol -  If you drink, do it moderately, less than 2 drinks per day.  - Health Care Power of Attorney. Choose someone to speak for you if you are not able.  - Depression is common in our stressful world.If you're feeling down or losing interest in things you normally enjoy, please come in for a visit.  - Violence - If anyone is threatening or hurting you, please call immediately.

## 2024-04-13 DIAGNOSIS — I10 Essential (primary) hypertension: Secondary | ICD-10-CM | POA: Diagnosis not present

## 2024-04-13 DIAGNOSIS — E1169 Type 2 diabetes mellitus with other specified complication: Secondary | ICD-10-CM | POA: Diagnosis not present

## 2024-04-13 DIAGNOSIS — E872 Acidosis, unspecified: Secondary | ICD-10-CM | POA: Diagnosis not present

## 2024-04-13 DIAGNOSIS — M1000 Idiopathic gout, unspecified site: Secondary | ICD-10-CM | POA: Diagnosis not present

## 2024-04-13 DIAGNOSIS — E039 Hypothyroidism, unspecified: Secondary | ICD-10-CM | POA: Diagnosis not present

## 2024-04-13 DIAGNOSIS — R809 Proteinuria, unspecified: Secondary | ICD-10-CM | POA: Diagnosis not present

## 2024-04-13 DIAGNOSIS — N400 Enlarged prostate without lower urinary tract symptoms: Secondary | ICD-10-CM | POA: Diagnosis not present

## 2024-04-13 DIAGNOSIS — N183 Chronic kidney disease, stage 3 unspecified: Secondary | ICD-10-CM | POA: Diagnosis not present

## 2024-04-13 DIAGNOSIS — R309 Painful micturition, unspecified: Secondary | ICD-10-CM | POA: Diagnosis not present

## 2024-04-13 DIAGNOSIS — N139 Obstructive and reflux uropathy, unspecified: Secondary | ICD-10-CM | POA: Diagnosis not present

## 2024-04-13 DIAGNOSIS — E875 Hyperkalemia: Secondary | ICD-10-CM | POA: Diagnosis not present

## 2024-04-14 DIAGNOSIS — E872 Acidosis, unspecified: Secondary | ICD-10-CM | POA: Insufficient documentation

## 2024-05-14 ENCOUNTER — Other Ambulatory Visit: Payer: Self-pay | Admitting: Physician Assistant

## 2024-05-14 DIAGNOSIS — E1121 Type 2 diabetes mellitus with diabetic nephropathy: Secondary | ICD-10-CM

## 2024-05-14 DIAGNOSIS — I10 Essential (primary) hypertension: Secondary | ICD-10-CM

## 2024-05-14 DIAGNOSIS — Z8739 Personal history of other diseases of the musculoskeletal system and connective tissue: Secondary | ICD-10-CM

## 2024-05-21 ENCOUNTER — Ambulatory Visit (INDEPENDENT_AMBULATORY_CARE_PROVIDER_SITE_OTHER): Admitting: Physician Assistant

## 2024-05-21 ENCOUNTER — Encounter: Payer: Self-pay | Admitting: Physician Assistant

## 2024-05-21 ENCOUNTER — Other Ambulatory Visit

## 2024-05-21 VITALS — BP 130/62 | HR 71 | Temp 97.4°F | Ht 69.0 in | Wt 164.0 lb

## 2024-05-21 DIAGNOSIS — E039 Hypothyroidism, unspecified: Secondary | ICD-10-CM | POA: Diagnosis not present

## 2024-05-21 DIAGNOSIS — M5442 Lumbago with sciatica, left side: Secondary | ICD-10-CM

## 2024-05-21 DIAGNOSIS — G8929 Other chronic pain: Secondary | ICD-10-CM | POA: Diagnosis not present

## 2024-05-21 MED ORDER — PREDNISONE 20 MG PO TABS: ORAL_TABLET | ORAL | 1 refills | Status: DC

## 2024-05-21 NOTE — Progress Notes (Signed)
 Acute Office Visit  Subjective:    Patient ID: David Baxter, male    DOB: 05/27/1941, 83 y.o.   MRN: [id number]  Chief Complaint  Patient presents with   Back Pain    HPI: Patient is in today for complaints of low back pain.  Pt states this has been an issues for several years and has intermittent problems but in the past several weeks has been having pain down his left leg into the top of his left foot - He states when that happens his left leg feels weak and tired and hard for him to walk properly.  Today in office he has some mild low back pain with no radiation to leg at this time Pt takes tylenol  as needed for back pain which at times is helpful (pt does not take NSAIDs due to kidney disease)  Pt with hypothyroidism - taking synthroid  137mcg qd - due to recheck TSH - we do not have phlebotomist in office today - pt will return to check tomorrow  Current Outpatient Medications:    allopurinol  (ZYLOPRIM ) 100 MG tablet, TAKE 1 TABLET EVERY DAY, Disp: 90 tablet, Rfl: 1   aspirin EC 81 MG tablet, Take 81 mg by mouth daily., Disp: , Rfl:    bethanechol  (URECHOLINE ) 5 MG tablet, Take 5 mg by mouth 2 (two) times daily., Disp: , Rfl:    Cholecalciferol (VITAMIN D -1000 MAX ST) 25 MCG (1000 UT) tablet, Take 2,000 Units by mouth daily., Disp: , Rfl:    COLCRYS 0.6 MG tablet, Take 0.6 mg by mouth as needed (gout flare ups)., Disp: , Rfl:    Cyanocobalamin (B-12 PO), Take 1 tablet by mouth daily., Disp: , Rfl:    finasteride  (PROSCAR ) 5 MG tablet, TAKE 1 TABLET (5 MG TOTAL) BY MOUTH DAILY., Disp: 90 tablet, Rfl: 3   gabapentin  (NEURONTIN ) 100 MG capsule, Take 1 capsule (100 mg total) by mouth 2 (two) times daily., Disp: 180 capsule, Rfl: 0   levothyroxine  (SYNTHROID ) 137 MCG tablet, Take 1 tablet (137 mcg total) by mouth daily before breakfast., Disp: 30 tablet, Rfl: 2   LOKELMA  10 g PACK packet, Take 10 g by mouth daily., Disp: 90 each, Rfl: 1   metFORMIN  (GLUCOPHAGE ) 500 MG tablet, TAKE 1  TABLET TWICE DAILY, Disp: 180 tablet, Rfl: 1   metoprolol  tartrate (LOPRESSOR ) 25 MG tablet, TAKE 1 TABLET TWICE DAILY, Disp: 180 tablet, Rfl: 1   mupirocin  ointment (BACTROBAN ) 2 %, Apply 1 Application topically 2 (two) times daily., Disp: 15 g, Rfl: 0   nitroGLYCERIN (NITROSTAT) 0.4 MG SL tablet, Place 0.4 mg under the tongue every 5 (five) minutes as needed for chest pain., Disp: , Rfl:    simvastatin  (ZOCOR ) 20 MG tablet, TAKE 1 TABLET EVERY DAY, Disp: 90 tablet, Rfl: 1   sodium bicarbonate 650 MG tablet, Take 650 mg by mouth 2 (two) times daily., Disp: , Rfl:    predniSONE  (DELTASONE ) 20 MG tablet, 1 po tid for 3 days then 1 po bid for 3 days then 1 po qd for 3 days, Disp: 18 tablet, Rfl: 1  Allergies  Allergen Reactions   Tamsulosin Other (See Comments)   Aspirin Other (See Comments) and Nausea Only    Upset stomach    ROS CONSTITUTIONAL: Negative for chills, fatigue, fever,  E/N/T: Negative for ear pain, nasal congestion and sore throat.  CARDIOVASCULAR: Negative for chest pain, dizziness, palpitations  RESPIRATORY: Negative for recent cough and dyspnea.   MSK: see HPI INTEGUMENTARY:  Negative for rash.       Objective:    PHYSICAL EXAM:   BP 130/62   Pulse 71   Temp (!) 97.4 F (36.3 C)   Ht 5' 9 (1.753 m)   Wt 164 lb (74.4 kg)   SpO2 98%   BMI 24.22 kg/m    GEN: Well nourished, well developed, in no acute distress   Cardiac: RRR; no murmurs, rubs, or gallops,no edema - Respiratory:  normal respiratory rate and pattern with no distress - normal breath sounds with no rales, rhonchi, wheezes or rubs GI: normal bowel sounds, no masses or tenderness MS: no deformity or atrophy - normal strength of lower extremities Skin: warm and dry, no rash       Assessment & Plan:    Acquired hypothyroidism Continue synthroid  qd TSH to be done tomorrow Chronic midline low back pain with left-sided sciatica Continue tylenol  Rx prednisone  taper Recommend MRI or  referral to ortho for further evaluation - pt defers at this time stating he is going out of state for the next 6 weeks    Follow-up: Return if symptoms worsen or fail to improve.  An After Visit Summary was printed and given to the patient.  CAMIE JONELLE NICHOLAUS DEVONNA [facility] [phone]

## 2024-05-22 ENCOUNTER — Other Ambulatory Visit

## 2024-05-22 DIAGNOSIS — E039 Hypothyroidism, unspecified: Secondary | ICD-10-CM | POA: Diagnosis not present

## 2024-05-23 ENCOUNTER — Ambulatory Visit: Payer: Self-pay | Admitting: Physician Assistant

## 2024-05-23 ENCOUNTER — Other Ambulatory Visit: Payer: Self-pay | Admitting: Physician Assistant

## 2024-05-23 DIAGNOSIS — E039 Hypothyroidism, unspecified: Secondary | ICD-10-CM

## 2024-05-23 LAB — TSH: TSH: 6.03 u[IU]/mL — ABNORMAL HIGH (ref 0.450–4.500)

## 2024-05-23 MED ORDER — LEVOTHYROXINE SODIUM 125 MCG PO TABS: 125.0000 ug | ORAL_TABLET | Freq: Every day | ORAL | 0 refills | Status: DC

## 2024-07-04 ENCOUNTER — Other Ambulatory Visit: Payer: Self-pay | Admitting: Physician Assistant

## 2024-07-24 ENCOUNTER — Telehealth: Payer: Self-pay

## 2024-07-24 ENCOUNTER — Other Ambulatory Visit (HOSPITAL_COMMUNITY): Payer: Self-pay

## 2024-07-24 NOTE — Telephone Encounter (Signed)
 Received provider portion of patient assistance application

## 2024-07-24 NOTE — Telephone Encounter (Signed)
 2026 Renewal  PAP: Patient assistance application for Lokelma  through [employer] ([employer]) has been mailed to pt's home address on file. Provider portion of application will be faxed to provider's office.   Provider portion of application will be faxed to Camie Moats

## 2024-07-30 ENCOUNTER — Other Ambulatory Visit: Payer: Self-pay | Admitting: Urology

## 2024-07-30 DIAGNOSIS — N401 Enlarged prostate with lower urinary tract symptoms: Secondary | ICD-10-CM

## 2024-08-13 ENCOUNTER — Ambulatory Visit (HOSPITAL_BASED_OUTPATIENT_CLINIC_OR_DEPARTMENT_OTHER)
Admission: RE | Admit: 2024-08-13 | Discharge: 2024-08-13 | Disposition: A | Discharge status: Home / Self Care | Payer: Medicare PPO | Source: Ambulatory Visit | Attending: Urology | Admitting: Urology

## 2024-08-13 DIAGNOSIS — N1330 Unspecified hydronephrosis: Secondary | ICD-10-CM | POA: Insufficient documentation

## 2024-08-18 ENCOUNTER — Other Ambulatory Visit: Payer: Self-pay | Admitting: Physician Assistant

## 2024-08-18 DIAGNOSIS — E039 Hypothyroidism, unspecified: Secondary | ICD-10-CM

## 2024-08-20 ENCOUNTER — Other Ambulatory Visit: Payer: Self-pay

## 2024-08-20 NOTE — Progress Notes (Unsigned)
 Cardiology Office Note:    Date:  08/21/2024   ID:  [name], DOB 11-Oct-1941, MRN [id number]  PCP:  Nicholaus Credit, PA-C  Cardiologist:  Redell Leiter, MD    Referring MD: Nicholaus Credit, PA-C    ASSESSMENT:    1. CAD in native artery   2. 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 Orthostatic hypotension   4. RBBB (right bundle branch block with left anterior fascicular block)   5. Mixed hyperlipidemia   6. Essential hypertension    PLAN:    In order of problems listed above:  David Baxter is doing quite stable [place]  Heart Association class I no anginal discomfort continue his medical therapy.  Aspirin beta-blocker nitroglycerin if needed and his statin Stable blood pressure well-controlled follows with nephrology CKD appears to be stable potassium is normal with Lokelma  Lipids are ideal continue his current statin No recurrence of symptomatic orthostatic hypotension   Next appointment: Plan to see 1 year   Medication Adjustments/Labs and Tests Ordered: Current medicines are reviewed at length with the patient today.  Concerns regarding medicines are outlined above.  Orders Placed This Encounter  Procedures   EKG 12-Lead   No orders of the defined types were placed in this encounter.    History of Present Illness:    David Baxter is a 83 y.o. male with a hx of CAD with 2 drug-eluting stents left circumflex artery 2016 hypertension with stage III CKD and hyperkalemia orthostatic hypotension bifascicular heart block type 2 diabetes and hyper lipidemia last seen 08/16/2023.  Compliance with diet, lifestyle and medications: Yes  Overall he is doing well he is pleased with the quality of his life follows nephrology tells me his kidney function is stable better and has no cardiovascular symptoms chest pain edema shortness of breath palpitation or syncope he has not needed to take nitroglycerin.  His labs are quite good in June cholesterol is  136 LDL 74 non-HDL cholesterol 89 A1c 6.0 hemoglobin 15.4 creatinine 1.7 and potassium normal 0.0  EKG is stable with right bundle branch block left axis patient Past Medical History:  Diagnosis Date   Acquired hypothyroidism 04/08/2018   Acute retention of urine 04/09/2018   Acute sinusitis 06/05/2020   AKI (acute kidney injury) 04/08/2018   Arthralgia of both lower legs    Benign prostatic hyperplasia 12/05/2019   Bilateral impacted cerumen 12/01/2022   BMI 23.0-23.9, adult 04/21/2020   BPH (benign prostatic hyperplasia)    BPH with urinary obstruction 12/05/2019   CAD (coronary artery disease)    cardiologist--  dr leiter w/ [facility] in [place]   Chronic kidney disease (CKD) stage G3b/A1, moderately decreased glomerular filtration rate (GFR) between 30-44 mL/min/1.73 square meter and albuminuria creatinine ratio less than 30 mg/g (HCC) 07/06/2015   CKD (chronic kidney disease) stage 3, GFR 30-59 ml/min (HCC) 07/06/2015   CKD (chronic kidney disease), stage III (HCC)    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 DES OM 2012    2. two DES to circumflex coronary artery 03/03/15   Coronary artery disease of bypass graft of native heart with stable angina pectoris 04/09/2015   Overview:   1. DES OM 2012     2.  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  Formatting of this note might be different  from the original.  per pt/ wife -- unknown when-- told this was on MRI in 2017     History of gout    History of kidney stones    History of stroke    per pt/ wife -- unknown when-- told this was on MRI in 2017   [facility] (hard of hearing)    Hydronephrosis 03/31/2021   Hyperkalemia 05/09/2020   Hyperlipemia, retention 04/09/2015   Hyperlipidemia    Hyperlipidemia associated with type 2 diabetes mellitus (HCC)    Hypertension    Hypertension associated with diabetes (HCC) 04/09/2015   Hypertrophic toenail 08/29/2023   Hypothyroidism    Malnutrition of mild degree 04/29/2021   Need for vaccination for pneumococcus 02/21/2023   Personal history of urinary calculi 09/23/2020   Presence of coronary angioplasty implant and graft 09/23/2020   Formatting of this note might be different from the original.  Formatting of this note might be different from the original.  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   bethanechol  (URECHOLINE ) 10 MG tablet Take 10 mg by mouth 2 (two) times daily.   Cholecalciferol (VITAMIN D -1000 MAX ST) 25 MCG (1000 UT) tablet Take 2,000 Units by mouth daily.   COLCRYS 0.6 MG tablet Take 0.6 mg by mouth as needed (gout flare ups).   Cyanocobalamin (B-12 PO) Take 1 tablet by mouth daily.   finasteride  (PROSCAR ) 5 MG tablet TAKE 1 TABLET (5 MG TOTAL) BY MOUTH DAILY.   gabapentin  (NEURONTIN ) 100 MG  capsule Take 1 capsule by mouth 2 times daily.   levothyroxine  (SYNTHROID ) 125 MCG tablet Take 1 tablet (125 mcg total) by mouth daily.   LOKELMA  10 g PACK packet Take 10 g by mouth daily.   metFORMIN  (GLUCOPHAGE ) 500 MG tablet TAKE 1 TABLET TWICE DAILY   metoprolol  tartrate (LOPRESSOR ) 25 MG tablet TAKE 1 TABLET TWICE DAILY   mupirocin  ointment (BACTROBAN ) 2 % Apply 1 Application topically 2 (two) times daily.   nitroGLYCERIN (NITROSTAT) 0.4 MG SL tablet Place 0.4 mg under the tongue every 5 (five) minutes as needed for chest pain.   predniSONE  (DELTASONE ) 20 MG tablet 1 po tid for 3 days then 1 po bid for 3 days then 1 po qd for 3 days   simvastatin  (ZOCOR ) 20 MG tablet TAKE 1 TABLET EVERY DAY   sodium bicarbonate 650 MG tablet Take 650 mg by mouth 2 (two) times daily.      EKGs/Labs/Other Studies Reviewed:  The following studies were reviewed today:  Cardiac Studies & Procedures   ______________________________________________________________________________________________        SHERRILEE  LONG TERM MONITOR-LIVE TELEMETRY (3-14 DAYS) 03/16/2024  Narrative Patch Wear Time:  14 days and 0 hours (2025-05-02T11:56:00-0400 to 2025-05-16T11:56:00-0400)  Patient had a min HR of 47 bpm, max HR of 135 bpm, and avg HR of 67 bpm. Predominant underlying rhythm was Sinus Rhythm. Bundle Branch Block/IVCD was present. 2 Supraventricular Tachycardia runs occurred, the run with the fastest interval lasting 4 beats with a max rate of 135 bpm, the longest lasting 18.0 secs with an avg rate of 95 bpm. Isolated SVEs were rare (<1.0%), SVE Couplets were rare (<1.0%), and SVE Triplets were rare (<1.0%). Isolated VEs were rare (<1.0%, 2280), VE Couplets were rare (<1.0%, 35), and VE Triplets were rare (<1.0%, 2).  Summary and conclusions: 2 episodes of supraventricular tachycardia longest episode 18 seconds at rate of 95        ______________________________________________________________________________________________      EKG Interpretation Date/Time:  Tuesday August 21 2024 13:36:39 EST Ventricular Rate:  69 PR Interval:  198 QRS Duration:  136 QT Interval:  442 QTC Calculation: 473 R Axis:   -38  Text Interpretation: Normal sinus rhythm Left axis deviation Right bundle branch block Minimal voltage criteria for LVH, may be normal variant ( R in aVL ) When compared with ECG of 16-Aug-2023 08:37, Right bundle branch block is new Confirmed by Monetta Rogue ([id number]) on 08/21/2024 1:51:31 PM   Recent Labs: 02/17/2024: Magnesium 1.7 03/28/2024: ALT 7; BUN 22; Creatinine, Ser 1.70; Hemoglobin 15.4; Platelets 376; Potassium 5.0; Sodium 143 05/22/2024: TSH 6.030  Recent Lipid Panel    Component Value Date/Time   CHOL 136 03/28/2024 1022   TRIG 78 03/28/2024 1022   HDL 47 03/28/2024 1022   CHOLHDL 2.9 03/28/2024 1022   LDLCALC 74 03/28/2024 1022    Physical Exam:    VS:  BP 108/72   Pulse 69   Ht 5' 11 (1.803 m)   Wt 168 lb (76.2 kg)   SpO2 96%   BMI 23.43 kg/m     Wt Readings from Last 3 Encounters:  08/21/24 168 lb (76.2 kg)  05/21/24 164 lb (74.4 kg)  03/28/24 163 lb 12.8 oz (74.3 kg)     GEN:  Well nourished, well developed in no acute distress HEENT: Normal NECK: No JVD; No carotid bruits LYMPHATICS: No lymphadenopathy CARDIAC: RRR, no murmurs, rubs, gallops RESPIRATORY:  Clear to auscultation without rales, wheezing or rhonchi  ABDOMEN: Soft, non-tender, non-distended MUSCULOSKELETAL:  No edema; No deformity  SKIN: Warm and dry NEUROLOGIC:  Alert and oriented x 3 PSYCHIATRIC:  Normal affect    Signed, Rogue Monetta, MD  08/21/2024 1:51 PM    [facility]

## 2024-08-21 ENCOUNTER — Ambulatory Visit: Attending: Cardiology | Admitting: Cardiology

## 2024-08-21 ENCOUNTER — Encounter: Payer: Self-pay | Admitting: Cardiology

## 2024-08-21 VITALS — BP 108/72 | HR 69 | Ht 71.0 in | Wt 168.0 lb

## 2024-08-21 DIAGNOSIS — I10 Essential (primary) hypertension: Secondary | ICD-10-CM | POA: Diagnosis not present

## 2024-08-21 DIAGNOSIS — E782 Mixed hyperlipidemia: Secondary | ICD-10-CM | POA: Diagnosis not present

## 2024-08-21 DIAGNOSIS — I2510 Atherosclerotic heart disease of native coronary artery without angina pectoris: Secondary | ICD-10-CM

## 2024-08-21 DIAGNOSIS — N1832 Chronic kidney disease, stage 3b: Secondary | ICD-10-CM

## 2024-08-21 DIAGNOSIS — I452 Bifascicular block: Secondary | ICD-10-CM | POA: Diagnosis not present

## 2024-08-21 DIAGNOSIS — I129 Hypertensive chronic kidney disease with stage 1 through stage 4 chronic kidney disease, or unspecified chronic kidney disease: Secondary | ICD-10-CM | POA: Diagnosis not present

## 2024-08-21 DIAGNOSIS — I951 Orthostatic hypotension: Secondary | ICD-10-CM

## 2024-08-21 NOTE — Patient Instructions (Signed)

## 2024-08-22 ENCOUNTER — Ambulatory Visit: Payer: Medicare PPO | Admitting: Urology

## 2024-08-22 ENCOUNTER — Other Ambulatory Visit (HOSPITAL_COMMUNITY): Payer: Self-pay

## 2024-08-22 VITALS — BP 168/73 | HR 61

## 2024-08-22 DIAGNOSIS — N401 Enlarged prostate with lower urinary tract symptoms: Secondary | ICD-10-CM

## 2024-08-22 DIAGNOSIS — R338 Other retention of urine: Secondary | ICD-10-CM | POA: Diagnosis not present

## 2024-08-22 DIAGNOSIS — N1330 Unspecified hydronephrosis: Secondary | ICD-10-CM

## 2024-08-22 DIAGNOSIS — R339 Retention of urine, unspecified: Secondary | ICD-10-CM

## 2024-08-22 LAB — URINALYSIS, ROUTINE W REFLEX MICROSCOPIC
Bilirubin, UA: NEGATIVE
Glucose, UA: NEGATIVE
Ketones, UA: NEGATIVE
Leukocytes,UA: NEGATIVE
Nitrite, UA: NEGATIVE
Protein,UA: NEGATIVE
RBC, UA: NEGATIVE
Specific Gravity, UA: 1.01 (ref 1.005–1.030)
Urobilinogen, Ur: 0.2 mg/dL (ref 0.2–1.0)
pH, UA: 7.5 (ref 5.0–7.5)

## 2024-08-22 NOTE — Progress Notes (Signed)
 08/22/2024 11:51 AM   David Baxter [birth year] [id number]  Referring provider: Nicholaus Credit, PA-C [address] [place],  [place] [address]  Followup BPH and hydronephrosis   HPI: Mr Mcintire is a 83yo here for followup fro BPh and hydronephrosis. IPSS 5 QOL 1. He takes bethanecol 10mg  BID and finasterdie 5mg  daily. Uirne stream strong. No straining to urinate. Nocturia 0-1x. Renal US  10/27 shows mild stable bilaterla hydronephrosis   PMH: Past Medical History:  Diagnosis Date   Acquired hypothyroidism 04/08/2018   Acute retention of urine 04/09/2018   Acute sinusitis 06/05/2020   AKI (acute kidney injury) 04/08/2018   Arthralgia of both lower legs    Benign prostatic hyperplasia 12/05/2019   Bilateral impacted cerumen 12/01/2022   BMI 23.0-23.9, adult 04/21/2020   BPH (benign prostatic hyperplasia)    BPH with urinary obstruction 12/05/2019   CAD (coronary artery disease)    cardiologist--  dr monetta w/ [facility] in [place]   Chronic kidney disease (CKD) stage G3b/A1, moderately decreased glomerular filtration rate (GFR) between 30-44 mL/min/1.73 square meter and albuminuria creatinine ratio less than 30 mg/g (HCC) 07/06/2015   CKD (chronic kidney disease) stage 3, GFR 30-59 ml/min (HCC) 07/06/2015   CKD (chronic kidney disease), stage III (HCC)    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 DES OM 2012    2. two DES to circumflex coronary artery 03/03/15   Coronary artery disease of bypass graft of native heart with stable angina pectoris 04/09/2015   Overview:   1. DES OM 2012     2. 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  Formatting of this note might be different from the original.  per pt/ wife -- unknown when-- told this was on MRI in 2017     History of gout    History of kidney stones    History of stroke    per pt/ wife -- unknown when-- told this was on MRI in 2017   [facility] (hard of hearing)    Hydronephrosis 03/31/2021   Hyperkalemia 05/09/2020   Hyperlipemia, retention 04/09/2015   Hyperlipidemia    Hyperlipidemia associated with type 2 diabetes mellitus (HCC)    Hypertension    Hypertension associated with diabetes (HCC) 04/09/2015   Hypertrophic toenail 08/29/2023   Hypothyroidism    Malnutrition of mild degree 04/29/2021   Need for vaccination for pneumococcus 02/21/2023   Personal history of urinary calculi 09/23/2020   Presence of coronary angioplasty implant and graft 09/23/2020   Formatting of this note might be different from the original.  Formatting of this note might be different from the original.  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   SIRS (systemic inflammatory response syndrome) (HCC) 04/08/2018  Type 2 diabetes mellitus (HCC) 04/08/2018   Type 2 diabetes mellitus with other specified complication (HCC) 09/23/2020   UTI (urinary tract infection) 04/08/2018   Vitamin D  deficiency 08/29/2023    Surgical History: Past Surgical History:  Procedure Laterality Date   CORONARY ANGIOPLASTY WITH STENT PLACEMENT  03/04/2015   x2 DES to CFX   CORONARY ANGIOPLASTY WITH STENT PLACEMENT  2012   x1 DES to OM   CYSTOSCOPY WITH INSERTION OF UROLIFT N/A 02/14/2017   Procedure: CYSTOSCOPY  WITH INSERTION OF UROLIFT;  Surgeon: Belvie LITTIE Clara, MD;  Location: [facility];  Service: Urology;  Laterality: N/A;   EXTRACORPOREAL SHOCK WAVE LITHOTRIPSY  1990's   PERCUTANEOUS NEPHROSTOLITHOTOMY  1991   and abdominal hernia repair   URETEROLITHOTOMY  1991 and 1994    Home Medications:  Allergies as of 08/22/2024       Reactions   Tamsulosin Other (See Comments)   Aspirin Other (See Comments), Nausea Only   Upset stomach        Medication List        Accurate as of August 22, 2024 11:51 AM. If you have any questions, ask your nurse or doctor.          allopurinol  100 MG tablet Commonly known as: ZYLOPRIM  TAKE 1 TABLET EVERY DAY   aspirin EC 81 MG tablet Take 81 mg by mouth daily.   B-12 PO Take 1 tablet by mouth daily.   bethanechol  10 MG tablet Commonly known as: URECHOLINE  Take 10 mg by mouth 2 (two) times daily.   Colcrys 0.6 MG tablet Generic drug: colchicine Take 0.6 mg by mouth as needed (gout flare ups).   finasteride  5 MG tablet Commonly known as: PROSCAR  TAKE 1 TABLET (5 MG TOTAL) BY MOUTH DAILY.   gabapentin  100 MG capsule Commonly known as: NEURONTIN  Take 1 capsule by mouth 2 times daily.   levothyroxine  125 MCG tablet Commonly known as: SYNTHROID  Take 1 tablet (125 mcg total) by mouth daily.   Lokelma  10 g Pack packet Generic drug: sodium zirconium cyclosilicate  Take 10 g by mouth daily.   metFORMIN  500 MG tablet Commonly known as: GLUCOPHAGE  TAKE 1 TABLET TWICE DAILY   metoprolol  tartrate 25 MG tablet Commonly known as: LOPRESSOR  TAKE 1 TABLET TWICE DAILY   mupirocin  ointment 2 % Commonly known as: BACTROBAN  Apply 1 Application topically 2 (two) times daily.   nitroGLYCERIN 0.4 MG SL tablet Commonly known as: NITROSTAT Place 0.4 mg under the tongue every 5 (five) minutes as needed for chest pain.   predniSONE  20 MG tablet Commonly known as: DELTASONE  1 po tid for 3 days then 1 po bid for 3 days then 1  po qd for 3 days   simvastatin  20 MG tablet Commonly known as: ZOCOR  TAKE 1 TABLET EVERY DAY   sodium bicarbonate 650 MG tablet Take 650 mg by mouth 2 (two) times daily.   Vitamin D -1000 Max St 25 MCG (1000 UT) tablet Generic drug: Cholecalciferol Take 2,000 Units by mouth daily.        Allergies:  Allergies  Allergen Reactions   Tamsulosin Other (See Comments)   Aspirin Other (See Comments) and Nausea Only    Upset stomach    Family History: Family History  Problem Relation Age of Onset   Heart disease Mother 68       After age 44 - CHF   Hypertension Father    Heart disease Father 20       After  age 55 - CHF    Social History:  reports that he has never smoked. He has never been exposed to tobacco smoke. He has never used smokeless tobacco. He reports that he does not drink alcohol  and does not use drugs.  ROS: All other review of systems were reviewed and are negative except what is noted above in HPI  Physical Exam: BP (!) 168/73   Pulse 61   Constitutional:  Alert and oriented, No acute distress. HEENT: [place] AT, moist mucus membranes.  Trachea midline, no masses. Cardiovascular: No clubbing, cyanosis, or edema. Respiratory: Normal respiratory effort, no increased work of breathing. GI: Abdomen is soft, nontender, nondistended, no abdominal masses GU: No CVA tenderness.  Lymph: No cervical or inguinal lymphadenopathy. Skin: No rashes, bruises or suspicious lesions. Neurologic: Grossly intact, no focal deficits, moving all 4 extremities. Psychiatric: Normal mood and affect.  Laboratory Data: Lab Results  Component Value Date   WBC 9.9 03/28/2024   HGB 15.4 03/28/2024   HCT 48.5 03/28/2024   MCV 93 03/28/2024   PLT 376 03/28/2024    Lab Results  Component Value Date   CREATININE 1.70 (H) 03/28/2024    No results found for: PSA  No results found for: TESTOSTERONE  Lab Results  Component Value Date   HGBA1C 6.0 (H) 03/28/2024     Urinalysis    Component Value Date/Time   APPEARANCEUR Clear 08/24/2023 1516   GLUCOSEU Negative 08/24/2023 1516   BILIRUBINUR Negative 08/24/2023 1516   PROTEINUR Negative 08/24/2023 1516   NITRITE Negative 08/24/2023 1516   LEUKOCYTESUR 1+ (A) 08/24/2023 1516    Lab Results  Component Value Date   LABMICR 28.1 08/29/2023   WBCUA 6-10 (A) 08/24/2023   LABEPIT 0-10 08/24/2023   BACTERIA None seen 08/24/2023    Pertinent Imaging: Renal US  08/13/24: Images reviewed and discussed with the patient  No results found for this or any previous visit.  No results found for this or any previous visit.  No results found for this or any previous visit.  No results found for this or any previous visit.  Results for orders placed during the hospital encounter of 08/13/24  Ultrasound renal complete  Narrative EXAM: US  Retroperitoneum Complete, Renal.  CLINICAL HISTORY: Hydronephrosis.  TECHNIQUE: Real-time ultrasound of the retroperitoneum (complete) with image documentation.  COMPARISON: US  Renal 08/22/2023.  FINDINGS:  RIGHT KIDNEY: Measures 10.4 x 5.6 x 5.0 cm. Moderate right renal cortical thinning. Normal cortical echogenicity. Limited evaluation of the upper pole due to obscuration by overlying bowel gas. Mild right hydronephrosis with minimal distention of the right renal pelvis. 3.9 cm simple cortical cyst noted exophytically from the interpolar region of the right kidney, better seen on prior CT examination of this number 14 2017. Cortical thinning has progressed since prior examination of 08/22/2023. Right ureteral jet is not visible. No renal stone or intrarenal calcifications.  LEFT KIDNEY: Measures 9.0 x 4.5 x 4.0 cm. Normal left renal cortical echogenicity and preserved cortical thickness. No hydronephrosis. No intrarenal masses or calcifications. Left ureteral jet is not visible. No renal stone.  BLADDER: Unremarkable as  visualized.  IMPRESSION: 1. Mild right hydronephrosis with minimal distention of the right renal pelvis and moderate right renal cortical thinning, progressed since prior examination of 08/22/2023. Limited evaluation of the upper pole due to obscuration by overlying bowel gas. 2. No left hydronephrosis.  Electronically signed by: Dorethia Molt MD 08/15/2024 01:11 AM EDT RP Workstation: [id number]  No results found for this or any previous  visit.  No results found for this or any previous visit.  No results found for this or any previous visit.   Assessment & Plan:    1. Benign localized prostatic hyperplasia with lower urinary tract symptoms (LUTS) (Primary) -continue finasterdie 5mg  daily - Urinalysis, Routine w reflex microscopic  2. Hydronephrosis, unspecified hydronephrosis type Followp 1year with renal US  - Urinalysis, Routine w reflex microscopic  3. Retention, urine Continue urocholine 10mg  BID   No follow-ups on file.  Belvie Clara, MD  [facility] [place]

## 2024-08-24 NOTE — Telephone Encounter (Signed)
 PAP: Application for Lokelma  has been submitted to [employer] ([employer]), via fax

## 2024-08-27 NOTE — Telephone Encounter (Signed)
 PAP: Patient assistance application for LOKELMA  has been approved by PAP Companies: [employer] from 10/18/2024 to 10/17/2025. Medication should be delivered to PAP Delivery: Home. For further shipping updates, please contact [employer] ([employer]) at [phone]. Patient ID is: [id number]

## 2024-08-28 ENCOUNTER — Encounter: Payer: Self-pay | Admitting: Urology

## 2024-08-28 MED ORDER — BETHANECHOL CHLORIDE 10 MG PO TABS: 10.0000 mg | ORAL_TABLET | Freq: Two times a day (BID) | ORAL | 11 refills | Status: AC

## 2024-08-28 MED ORDER — FINASTERIDE 5 MG PO TABS: 5.0000 mg | ORAL_TABLET | Freq: Every day | ORAL | 3 refills | Status: AC

## 2024-08-28 NOTE — Patient Instructions (Signed)

## 2024-09-04 DIAGNOSIS — H9193 Unspecified hearing loss, bilateral: Secondary | ICD-10-CM | POA: Diagnosis not present

## 2024-09-05 ENCOUNTER — Encounter: Payer: Self-pay | Admitting: Physician Assistant

## 2024-09-05 ENCOUNTER — Ambulatory Visit: Admitting: Physician Assistant

## 2024-09-05 VITALS — BP 136/80 | HR 71 | Temp 98.2°F | Resp 18 | Ht 71.0 in | Wt 164.0 lb

## 2024-09-05 DIAGNOSIS — E1169 Type 2 diabetes mellitus with other specified complication: Secondary | ICD-10-CM

## 2024-09-05 DIAGNOSIS — N183 Chronic kidney disease, stage 3 unspecified: Secondary | ICD-10-CM | POA: Diagnosis not present

## 2024-09-05 DIAGNOSIS — I152 Hypertension secondary to endocrine disorders: Secondary | ICD-10-CM

## 2024-09-05 DIAGNOSIS — N401 Enlarged prostate with lower urinary tract symptoms: Secondary | ICD-10-CM | POA: Diagnosis not present

## 2024-09-05 DIAGNOSIS — Z23 Encounter for immunization: Secondary | ICD-10-CM

## 2024-09-05 DIAGNOSIS — E785 Hyperlipidemia, unspecified: Secondary | ICD-10-CM | POA: Diagnosis not present

## 2024-09-05 DIAGNOSIS — I25708 Atherosclerosis of coronary artery bypass graft(s), unspecified, with other forms of angina pectoris: Secondary | ICD-10-CM | POA: Diagnosis not present

## 2024-09-05 DIAGNOSIS — Z8739 Personal history of other diseases of the musculoskeletal system and connective tissue: Secondary | ICD-10-CM | POA: Diagnosis not present

## 2024-09-05 DIAGNOSIS — M1000 Idiopathic gout, unspecified site: Secondary | ICD-10-CM | POA: Diagnosis not present

## 2024-09-05 DIAGNOSIS — E039 Hypothyroidism, unspecified: Secondary | ICD-10-CM | POA: Diagnosis not present

## 2024-09-05 DIAGNOSIS — E872 Acidosis, unspecified: Secondary | ICD-10-CM | POA: Diagnosis not present

## 2024-09-05 DIAGNOSIS — E1122 Type 2 diabetes mellitus with diabetic chronic kidney disease: Secondary | ICD-10-CM

## 2024-09-05 DIAGNOSIS — I10 Essential (primary) hypertension: Secondary | ICD-10-CM | POA: Diagnosis not present

## 2024-09-05 DIAGNOSIS — N1832 Chronic kidney disease, stage 3b: Secondary | ICD-10-CM

## 2024-09-05 DIAGNOSIS — E1159 Type 2 diabetes mellitus with other circulatory complications: Secondary | ICD-10-CM | POA: Diagnosis not present

## 2024-09-05 DIAGNOSIS — N400 Enlarged prostate without lower urinary tract symptoms: Secondary | ICD-10-CM | POA: Diagnosis not present

## 2024-09-05 DIAGNOSIS — R350 Frequency of micturition: Secondary | ICD-10-CM | POA: Diagnosis not present

## 2024-09-05 DIAGNOSIS — N139 Obstructive and reflux uropathy, unspecified: Secondary | ICD-10-CM | POA: Diagnosis not present

## 2024-09-05 DIAGNOSIS — E559 Vitamin D deficiency, unspecified: Secondary | ICD-10-CM

## 2024-09-05 DIAGNOSIS — E875 Hyperkalemia: Secondary | ICD-10-CM | POA: Diagnosis not present

## 2024-09-05 DIAGNOSIS — H918X3 Other specified hearing loss, bilateral: Secondary | ICD-10-CM

## 2024-09-05 NOTE — Progress Notes (Signed)
 Subjective:  Patient ID: David Baxter, male    DOB: 11/27/1940  Age: 83 y.o. MRN: [id number]  Chief Complaint  Patient presents with   Medical Management of Chronic Issues    HPI   Patient present with type 2 diabetes controlled without use of insuline.   Compliance with treatment has been good; patient take medicines as directed, maintains diet and exercise regimen, follows up as directed, His fasting glucose ranges 90s. Current medicines for diabetes Metformin  500 mg twice a day. Patient performs foot exams daily and last ophthalmologic exam was 4/25.   Patient presents with hyperlipidemia.  Compliance with treatment has been good; patient takes medicines as directed, maintains low cholesterol diet, follows up as directed, and maintains exercise regimen.  Patient is using Simvastatin  20 mg daily without problems.   Patient presents for follow up of hypertension.  Patient tolerating Metoprolol  25 mg twice a day, Aspirin 81 mg daily well without side effects. Patient is working on maintaining diet and exercise regimen and follows up as directed.  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 had 5 prior stents) - follows with Dr Monetta (cardiology) yearly   Hypothyroidism: Patient is taking Levothyroxine  125 mcg daily. Due for labwork - denies any symptoms  GOUT: Taking Allopurinol  100 mg daily, Colcrys 0.6 mg PRN for gout flares, bethanechol  10 mg in the morning.  Pt with BPH - currently on Proscar  5mg  qd and follows with urologist Dr Sherrilee regularly Pt also with history of chronic kidney diseases stage 3b - does follow with nephrologist Dr A in [facility] regularly - currently takes lokelma  10g pack and sodium bicarb 650mg   Pt does wear hearing aids with history of hearing loss  Pt would like COVID vaccine today Declines flu shot Current Outpatient Medications on File Prior to Visit  Medication Sig Dispense Refill   allopurinol  (ZYLOPRIM ) 100 MG tablet TAKE 1 TABLET EVERY DAY 90 tablet 1    aspirin EC 81 MG tablet Take 81 mg by mouth daily.     bethanechol  (URECHOLINE ) 10 MG tablet Take 1 tablet (10 mg total) by mouth 2 (two) times daily. 60 tablet 11   Cholecalciferol (VITAMIN D -1000 MAX ST) 25 MCG (1000 UT) tablet Take 2,000 Units by mouth daily.     COLCRYS 0.6 MG tablet Take 0.6 mg by mouth as needed (gout flare ups).     Cyanocobalamin (B-12 PO) Take 1 tablet by mouth daily.     finasteride  (PROSCAR ) 5 MG tablet Take 1 tablet (5 mg total) by mouth daily. 90 tablet 3   gabapentin  (NEURONTIN ) 100 MG capsule Take 1 capsule by mouth 2 times daily. 180 capsule 0   levothyroxine  (SYNTHROID ) 125 MCG tablet Take 1 tablet (125 mcg total) by mouth daily. 90 tablet 0   LOKELMA  10 g PACK packet Take 10 g by mouth daily. 90 each 1   metFORMIN  (GLUCOPHAGE ) 500 MG tablet TAKE 1 TABLET TWICE DAILY 180 tablet 1   metoprolol  tartrate (LOPRESSOR ) 25 MG tablet TAKE 1 TABLET TWICE DAILY 180 tablet 1   mupirocin  ointment (BACTROBAN ) 2 % Apply 1 Application topically 2 (two) times daily. 15 g 0   nitroGLYCERIN (NITROSTAT) 0.4 MG SL tablet Place 0.4 mg under the tongue every 5 (five) minutes as needed for chest pain.     simvastatin  (ZOCOR ) 20 MG tablet TAKE 1 TABLET EVERY DAY 90 tablet 1   sodium bicarbonate 650 MG tablet Take 650 mg by mouth 2 (two) times daily.  No current facility-administered medications on file prior to visit.   Past Medical History:  Diagnosis Date   Acquired hypothyroidism 04/08/2018   Acute retention of urine 04/09/2018   Acute sinusitis 06/05/2020   AKI (acute kidney injury) 04/08/2018   Arthralgia of both lower legs    Benign prostatic hyperplasia 12/05/2019   Bilateral impacted cerumen 12/01/2022   BMI 23.0-23.9, adult 04/21/2020   BPH (benign prostatic hyperplasia)    BPH with urinary obstruction 12/05/2019   CAD (coronary artery disease)    cardiologist--  dr monetta w/ [facility] in    Chronic kidney disease (CKD) stage G3b/A1, moderately  decreased glomerular filtration rate (GFR) between 30-44 mL/min/1.73 square meter and albuminuria creatinine ratio less than 30 mg/g (HCC) 07/06/2015   CKD (chronic kidney disease) stage 3, GFR 30-59 ml/min (HCC) 07/06/2015   CKD (chronic kidney disease), stage III (HCC)    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 DES OM 2012    2. two DES to circumflex coronary artery 03/03/15   Coronary artery disease of bypass graft of native heart with stable angina pectoris 04/09/2015   Overview:   1. DES OM 2012     2. 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  Formatting of this note might be different from the original.  per pt/ wife -- unknown when-- told this was on MRI in 2017     History of gout    History of kidney stones    History of stroke    per pt/ wife -- unknown when-- told this was on MRI in 2017   [facility] (hard of hearing)    Hydronephrosis 03/31/2021   Hyperkalemia 05/09/2020   Hyperlipemia, retention 04/09/2015   Hyperlipidemia    Hyperlipidemia associated with type 2 diabetes mellitus (HCC)    Hypertension    Hypertension associated with diabetes (HCC) 04/09/2015   Hypertrophic toenail 08/29/2023   Hypothyroidism    Malnutrition of mild degree 04/29/2021   Need for vaccination for pneumococcus 02/21/2023   Personal history of urinary calculi 09/23/2020   Presence of coronary angioplasty implant and  graft 09/23/2020   Formatting of this note might be different from the original.  Formatting of this note might be different from the original.  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   SIRS (systemic inflammatory response syndrome) (HCC) 04/08/2018   Type 2 diabetes mellitus (HCC) 04/08/2018   Type 2 diabetes mellitus with other specified complication (HCC) 09/23/2020   UTI (urinary tract infection) 04/08/2018   Vitamin D  deficiency 08/29/2023   Past Surgical History:  Procedure Laterality Date   CORONARY ANGIOPLASTY WITH STENT PLACEMENT  03/04/2015   x2 DES to CFX   CORONARY ANGIOPLASTY WITH STENT PLACEMENT  2012   x1 DES  to OM   CYSTOSCOPY WITH INSERTION OF UROLIFT N/A 02/14/2017   Procedure: CYSTOSCOPY WITH INSERTION OF UROLIFT;  Surgeon: Belvie LITTIE Clara, MD;  [facility];  Service: Urology;  Laterality: N/A;   EXTRACORPOREAL SHOCK WAVE LITHOTRIPSY  1990's   PERCUTANEOUS NEPHROSTOLITHOTOMY  1991   and abdominal hernia repair   URETEROLITHOTOMY  1991 and 1994    Family History  Problem Relation Age of Onset   Heart disease Mother 64       After age 68 - CHF   Hypertension Father    Heart disease Father 68       After age 51 - CHF   Social History   Socioeconomic History   Marital status: Married    Spouse name: Not on file   Number of children: Not on file   Years of education: Not on file   Highest education level: Not on file  Occupational History   Occupation: truck driver  Tobacco Use   Smoking status: Never    Passive exposure: Never   Smokeless tobacco: Never  Vaping Use   Vaping status: Never Used  Substance and Sexual Activity   Alcohol  use: No   Drug use: No   Sexual activity: Yes    Partners: Female  Other Topics Concern   Not on file  Social History Narrative   Not on file    Social Drivers of Health   Financial Resource Strain: High Risk (08/18/2022)   Overall Financial Resource Strain (CARDIA)    Difficulty of Paying Living Expenses: Hard  Food Insecurity: No Food Insecurity (10/20/2023)   Received from [facility]   Hunger Vital Sign    Within the past 12 months, you worried that your food would run out before you got the money to buy more.: Never true    Within the past 12 months, the food you bought just didn't last and you didn't have money to get more.: Never true  Transportation Needs: No Transportation Needs (10/20/2023)   Received from [facility]   PRAPARE - Administrator, Civil Service (Medical): No    Lack of Transportation (Non-Medical): No  Physical Activity: Not on file  Stress: Not on file  Social Connections: Not on file    CONSTITUTIONAL: Negative for chills, fatigue, fever, unintentional weight gain and unintentional weight loss.  E/N/T: Negative for ear pain, nasal congestion and sore throat.  CARDIOVASCULAR: Negative for chest pain, dizziness, palpitations and pedal edema.  RESPIRATORY: Negative for recent cough and dyspnea.  GASTROINTESTINAL: Negative for abdominal pain, acid reflux symptoms, constipation, diarrhea, nausea and vomiting.  MSK: Negative for arthralgias and myalgias.  INTEGUMENTARY: Negative for rash.  NEUROLOGICAL: Negative for dizziness and headaches.  PSYCHIATRIC: Negative for sleep disturbance and to question depression screen.  Negative for depression, negative for anhedonia.       Objective:  PHYSICAL EXAM:   VS: BP 136/80   Pulse 71   Temp 98.2 F (36.8 C) (Temporal)   Resp 18   Ht 5' 11 (1.803 m)   Wt 164 lb (74.4 kg)   SpO2 97%   BMI 22.87 kg/m   GEN: Well nourished, well developed, in no acute distress  Cardiac: RRR; no murmurs, rubs, or gallops,no edema  Respiratory:  normal respiratory rate and pattern with no distress - normal breath  sounds with no rales, rhonchi, wheezes or rubs MS: no deformity or atrophy  Skin: warm and dry, no  rash  Neuro:  Alert and Oriented x 3, Strength and sensation are intact - CN II-Xii grossly intact Psych: euthymic mood, appropriate affect and demeanor Diabetic Foot Exam - Simple   Simple Foot Form Diabetic Foot exam was performed with the following findings: Yes 09/05/2024  9:14 AM  Visual Inspection No deformities, no ulcerations, no other skin breakdown bilaterally: Yes See comments: Yes Sensation Testing Intact to touch and monofilament testing bilaterally: Yes Pulse Check Posterior Tibialis and Dorsalis pulse intact bilaterally: Yes Comments Patient feet was a little purple, but I could feel pulse and he had sensation      Lab Results  Component Value Date   WBC 9.9 03/28/2024   HGB 15.4 03/28/2024   HCT 48.5 03/28/2024   PLT 376 03/28/2024   GLUCOSE 115 (H) 03/28/2024   CHOL 136 03/28/2024   TRIG 78 03/28/2024   HDL 47 03/28/2024   LDLCALC 74 03/28/2024   ALT 7 03/28/2024   AST 16 03/28/2024   NA 143 03/28/2024   K 5.0 03/28/2024   CL 102 03/28/2024   CREATININE 1.70 (H) 03/28/2024   BUN 22 03/28/2024   CO2 24 03/28/2024   TSH 6.030 (H) 05/22/2024   HGBA1C 6.0 (H) 03/28/2024   MICROALBUR 80 12/23/2020      Assessment & Plan:   Problem List Items Addressed This Visit       Cardiovascular and Mediastinum   Coronary artery disease of bypass graft of native heart with stable angina pectoris (HCC) Continue current meds Follow up with cardiology as directed     Hypertension associated with diabetes (HCC) Continue current meds     Endocrine   Controlled type 2 diabetes with stabe 3 chronic kidney disease without long term use of insulin(HCC) Conitnue meds as directed Watch diet Labwork pending      Other   Hyperlipidemia associated with type 2 diabetes (HCC) Continue med  Watch diet Continue med  History of gout Uric acid level  pending Continue meds  Chronic kidney disease stage 3b Continue meds and follow up with nephrology  Hypothyroidism TSH pending Continue levothyroxine   BPH Continue meds and follow up with urologist as scheduled  Hearing loss Has hearing aids  Vit D def Continue supplement Labwork pending  Immunization due COVID vaccine given   Other Visit Diagnoses       .    Orders Placed This Encounter  Procedures   [employer] Comirnaty Covid -19 Vaccine 66yrs and older   Microalbumin / creatinine urine ratio   CBC with Differential/Platelet   Comprehensive metabolic panel with GFR   TSH   Lipid panel   Hemoglobin A1c   PSA   Uric Acid   VITAMIN D  25 Hydroxy (Vit-D Deficiency, Fractures)     Follow-up: Return in about 6 months (around 03/05/2025) for chronic fasting follow-up.  An After Visit Summary was printed and given to the patient.  CAMIE JONELLE NICHOLAUS DEVONNA [facility] [phone]

## 2024-09-06 ENCOUNTER — Other Ambulatory Visit: Payer: Self-pay | Admitting: Physician Assistant

## 2024-09-06 ENCOUNTER — Ambulatory Visit: Payer: Self-pay | Admitting: Physician Assistant

## 2024-09-06 DIAGNOSIS — E039 Hypothyroidism, unspecified: Secondary | ICD-10-CM

## 2024-09-06 LAB — CBC WITH DIFFERENTIAL/PLATELET
Basophils Absolute: 0.1 x10E3/uL (ref 0.0–0.2)
Basos: 1 %
EOS (ABSOLUTE): 0.5 x10E3/uL — ABNORMAL HIGH (ref 0.0–0.4)
Eos: 5 %
Hematocrit: 43.9 % (ref 37.5–51.0)
Hemoglobin: 14.6 g/dL (ref 13.0–17.7)
Immature Grans (Abs): 0 x10E3/uL (ref 0.0–0.1)
Immature Granulocytes: 0 %
Lymphocytes Absolute: 2.7 x10E3/uL (ref 0.7–3.1)
Lymphs: 29 %
MCH: 30 pg (ref 26.6–33.0)
MCHC: 33.3 g/dL (ref 31.5–35.7)
MCV: 90 fL (ref 79–97)
Monocytes Absolute: 1.1 x10E3/uL — ABNORMAL HIGH (ref 0.1–0.9)
Monocytes: 12 %
Neutrophils Absolute: 5.1 x10E3/uL (ref 1.4–7.0)
Neutrophils: 53 %
Platelets: 392 x10E3/uL (ref 150–450)
RBC: 4.86 x10E6/uL (ref 4.14–5.80)
RDW: 13.2 % (ref 11.6–15.4)
WBC: 9.5 x10E3/uL (ref 3.4–10.8)

## 2024-09-06 LAB — COMPREHENSIVE METABOLIC PANEL WITH GFR
ALT: 10 IU/L (ref 0–44)
AST: 15 IU/L (ref 0–40)
Albumin: 4.1 g/dL (ref 3.7–4.7)
Alkaline Phosphatase: 113 IU/L (ref 48–129)
BUN/Creatinine Ratio: 11 (ref 10–24)
BUN: 18 mg/dL (ref 8–27)
Bilirubin Total: 0.6 mg/dL (ref 0.0–1.2)
CO2: 27 mmol/L (ref 20–29)
Calcium: 9.4 mg/dL (ref 8.6–10.2)
Chloride: 101 mmol/L (ref 96–106)
Creatinine, Ser: 1.59 mg/dL — ABNORMAL HIGH (ref 0.76–1.27)
Globulin, Total: 2.3 g/dL (ref 1.5–4.5)
Glucose: 104 mg/dL — ABNORMAL HIGH (ref 70–99)
Potassium: 5.2 mmol/L (ref 3.5–5.2)
Sodium: 142 mmol/L (ref 134–144)
Total Protein: 6.4 g/dL (ref 6.0–8.5)
eGFR: 42 mL/min/1.73 — ABNORMAL LOW (ref >59)

## 2024-09-06 LAB — LIPID PANEL
Chol/HDL Ratio: 3.1 ratio (ref 0.0–5.0)
Cholesterol, Total: 127 mg/dL (ref 100–199)
HDL: 41 mg/dL (ref >39)
LDL Chol Calc (NIH): 68 mg/dL (ref 0–99)
Triglycerides: 95 mg/dL (ref 0–149)
VLDL Cholesterol Cal: 18 mg/dL (ref 5–40)

## 2024-09-06 LAB — URIC ACID: Uric Acid: 5.3 mg/dL (ref 3.8–8.4)

## 2024-09-06 LAB — MICROALBUMIN / CREATININE URINE RATIO
Creatinine, Urine: 65.2 mg/dL
Microalb/Creat Ratio: 55 mg/g{creat} — ABNORMAL HIGH (ref 0–29)
Microalbumin, Urine: 35.9 ug/mL

## 2024-09-06 LAB — HEMOGLOBIN A1C
Est. average glucose Bld gHb Est-mCnc: 131 mg/dL
Hgb A1c MFr Bld: 6.2 % — ABNORMAL HIGH (ref 4.8–5.6)

## 2024-09-06 LAB — PSA: Prostate Specific Ag, Serum: 0.2 ng/mL (ref 0.0–4.0)

## 2024-09-06 LAB — TSH: TSH: 0.392 u[IU]/mL — ABNORMAL LOW (ref 0.450–4.500)

## 2024-09-06 LAB — VITAMIN D 25 HYDROXY (VIT D DEFICIENCY, FRACTURES): Vit D, 25-Hydroxy: 55 ng/mL (ref 30.0–100.0)

## 2024-09-06 MED ORDER — LEVOTHYROXINE SODIUM 112 MCG PO TABS: 112.0000 ug | ORAL_TABLET | Freq: Every day | ORAL | 1 refills | Status: AC

## 2024-09-11 NOTE — Progress Notes (Signed)
Letter sent at this time

## 2024-10-19 ENCOUNTER — Other Ambulatory Visit: Payer: Self-pay | Admitting: Physician Assistant

## 2024-10-19 DIAGNOSIS — I10 Essential (primary) hypertension: Secondary | ICD-10-CM

## 2024-10-19 DIAGNOSIS — Z8739 Personal history of other diseases of the musculoskeletal system and connective tissue: Secondary | ICD-10-CM

## 2024-10-19 DIAGNOSIS — E1121 Type 2 diabetes mellitus with diabetic nephropathy: Secondary | ICD-10-CM

## 2025-08-28 ENCOUNTER — Ambulatory Visit: Admitting: Urology
